# Patient Record
Sex: Female | Born: 1935 | Race: White | Hispanic: No | Smoking: Never smoker
Health system: Southern US, Community
[De-identification: ages and names within clinical notes are randomized; demographics above are authoritative.]

## PROBLEM LIST (undated history)

## (undated) DIAGNOSIS — I509 Heart failure, unspecified: Secondary | ICD-10-CM

## (undated) DIAGNOSIS — I10 Essential (primary) hypertension: Secondary | ICD-10-CM

## (undated) DIAGNOSIS — I441 Atrioventricular block, second degree: Secondary | ICD-10-CM

## (undated) DIAGNOSIS — D649 Anemia, unspecified: Secondary | ICD-10-CM

## (undated) DIAGNOSIS — R011 Cardiac murmur, unspecified: Secondary | ICD-10-CM

## (undated) DIAGNOSIS — I5032 Chronic diastolic (congestive) heart failure: Secondary | ICD-10-CM

## (undated) DIAGNOSIS — Z961 Presence of intraocular lens: Secondary | ICD-10-CM

## (undated) DIAGNOSIS — I4891 Unspecified atrial fibrillation: Secondary | ICD-10-CM

## (undated) DIAGNOSIS — M1990 Unspecified osteoarthritis, unspecified site: Secondary | ICD-10-CM

## (undated) DIAGNOSIS — IMO0002 Reserved for concepts with insufficient information to code with codable children: Secondary | ICD-10-CM

## (undated) DIAGNOSIS — F0390 Unspecified dementia without behavioral disturbance: Secondary | ICD-10-CM

## (undated) DIAGNOSIS — Z7901 Long term (current) use of anticoagulants: Secondary | ICD-10-CM

## (undated) DIAGNOSIS — J189 Pneumonia, unspecified organism: Secondary | ICD-10-CM

## (undated) DIAGNOSIS — I340 Nonrheumatic mitral (valve) insufficiency: Secondary | ICD-10-CM

## (undated) DIAGNOSIS — Z950 Presence of cardiac pacemaker: Secondary | ICD-10-CM

## (undated) DIAGNOSIS — R001 Bradycardia, unspecified: Secondary | ICD-10-CM

## (undated) DIAGNOSIS — H26491 Other secondary cataract, right eye: Secondary | ICD-10-CM

## (undated) DIAGNOSIS — I119 Hypertensive heart disease without heart failure: Secondary | ICD-10-CM

## (undated) HISTORY — DX: Reserved for concepts with insufficient information to code with codable children: IMO0002

## (undated) HISTORY — PX: TOTAL HIP ARTHROPLASTY: SHX124

## (undated) HISTORY — DX: Presence of intraocular lens: Z96.1

## (undated) HISTORY — DX: Bradycardia, unspecified: R00.1

## (undated) HISTORY — DX: Unspecified atrial fibrillation: I48.91

## (undated) HISTORY — DX: Hypertensive heart disease without heart failure: I11.9

## (undated) HISTORY — DX: Atrioventricular block, second degree: I44.1

## (undated) HISTORY — DX: Chronic diastolic (congestive) heart failure: I50.32

## (undated) HISTORY — DX: Nonrheumatic mitral (valve) insufficiency: I34.0

## (undated) HISTORY — DX: Other secondary cataract, right eye: H26.491

---

## 1998-08-03 ENCOUNTER — Other Ambulatory Visit: Admission: RE | Admit: 1998-08-03 | Discharge: 1998-08-03 | Payer: Self-pay | Admitting: Obstetrics & Gynecology

## 1999-08-13 ENCOUNTER — Other Ambulatory Visit: Admission: RE | Admit: 1999-08-13 | Discharge: 1999-08-13 | Payer: Self-pay | Admitting: Obstetrics & Gynecology

## 1999-11-27 ENCOUNTER — Encounter: Payer: Self-pay | Admitting: Specialist

## 1999-11-30 ENCOUNTER — Encounter (INDEPENDENT_AMBULATORY_CARE_PROVIDER_SITE_OTHER): Payer: Self-pay

## 1999-11-30 ENCOUNTER — Encounter: Payer: Self-pay | Admitting: Specialist

## 1999-11-30 ENCOUNTER — Inpatient Hospital Stay (HOSPITAL_COMMUNITY): Admission: RE | Admit: 1999-11-30 | Discharge: 1999-12-03 | Payer: Self-pay | Admitting: Specialist

## 1999-12-03 ENCOUNTER — Inpatient Hospital Stay (HOSPITAL_COMMUNITY)
Admission: RE | Admit: 1999-12-03 | Discharge: 1999-12-07 | Payer: Self-pay | Admitting: Physical Medicine & Rehabilitation

## 2000-07-08 HISTORY — PX: COLONOSCOPY: SHX174

## 2000-08-20 ENCOUNTER — Other Ambulatory Visit: Admission: RE | Admit: 2000-08-20 | Discharge: 2000-08-20 | Payer: Self-pay | Admitting: Obstetrics & Gynecology

## 2001-02-11 ENCOUNTER — Ambulatory Visit (HOSPITAL_COMMUNITY): Admission: RE | Admit: 2001-02-11 | Discharge: 2001-02-11 | Payer: Self-pay | Admitting: Gastroenterology

## 2002-10-14 ENCOUNTER — Other Ambulatory Visit: Admission: RE | Admit: 2002-10-14 | Discharge: 2002-10-14 | Payer: Self-pay | Admitting: Obstetrics & Gynecology

## 2004-11-08 ENCOUNTER — Other Ambulatory Visit: Admission: RE | Admit: 2004-11-08 | Discharge: 2004-11-08 | Payer: Self-pay | Admitting: Obstetrics & Gynecology

## 2005-08-02 ENCOUNTER — Encounter: Admission: RE | Admit: 2005-08-02 | Discharge: 2005-08-02 | Payer: Self-pay | Admitting: Orthopedic Surgery

## 2005-09-05 ENCOUNTER — Encounter: Admission: RE | Admit: 2005-09-05 | Discharge: 2005-09-05 | Payer: Self-pay | Admitting: Cardiology

## 2005-09-12 ENCOUNTER — Encounter: Payer: Self-pay | Admitting: Cardiology

## 2005-09-12 ENCOUNTER — Ambulatory Visit (HOSPITAL_COMMUNITY): Admission: RE | Admit: 2005-09-12 | Discharge: 2005-09-12 | Payer: Self-pay | Admitting: Cardiology

## 2005-10-15 ENCOUNTER — Inpatient Hospital Stay (HOSPITAL_COMMUNITY): Admission: RE | Admit: 2005-10-15 | Discharge: 2005-10-18 | Payer: Self-pay | Admitting: Orthopedic Surgery

## 2005-10-15 ENCOUNTER — Ambulatory Visit: Payer: Self-pay | Admitting: Physical Medicine & Rehabilitation

## 2006-01-27 ENCOUNTER — Inpatient Hospital Stay (HOSPITAL_COMMUNITY): Admission: AD | Admit: 2006-01-27 | Discharge: 2006-01-30 | Payer: Self-pay | Admitting: Cardiology

## 2009-10-18 ENCOUNTER — Emergency Department (HOSPITAL_COMMUNITY): Admission: EM | Admit: 2009-10-18 | Discharge: 2009-10-18 | Payer: Self-pay | Admitting: Emergency Medicine

## 2009-10-22 ENCOUNTER — Emergency Department (HOSPITAL_COMMUNITY): Admission: EM | Admit: 2009-10-22 | Discharge: 2009-10-22 | Payer: Self-pay | Admitting: Emergency Medicine

## 2010-03-18 ENCOUNTER — Emergency Department (HOSPITAL_COMMUNITY): Admission: EM | Admit: 2010-03-18 | Discharge: 2010-03-18 | Payer: Self-pay | Admitting: Emergency Medicine

## 2010-03-20 ENCOUNTER — Emergency Department (HOSPITAL_COMMUNITY): Admission: EM | Admit: 2010-03-20 | Discharge: 2010-03-20 | Payer: Self-pay | Admitting: Family Medicine

## 2010-09-20 LAB — PROTIME-INR
INR: 1.75 — ABNORMAL HIGH (ref 0.00–1.49)
Prothrombin Time: 20.6 seconds — ABNORMAL HIGH (ref 11.6–15.2)

## 2010-09-25 LAB — CBC
MCHC: 36 g/dL (ref 30.0–36.0)
RDW: 12.8 % (ref 11.5–15.5)

## 2010-09-25 LAB — DIFFERENTIAL
Basophils Relative: 0 % (ref 0–1)
Eosinophils Absolute: 0.1 10*3/uL (ref 0.0–0.7)
Eosinophils Relative: 1 % (ref 0–5)
Lymphocytes Relative: 18 % (ref 12–46)
Monocytes Absolute: 1.1 10*3/uL — ABNORMAL HIGH (ref 0.1–1.0)
Neutro Abs: 7.4 10*3/uL (ref 1.7–7.7)

## 2010-09-25 LAB — URINALYSIS, ROUTINE W REFLEX MICROSCOPIC
Bilirubin Urine: NEGATIVE
Glucose, UA: NEGATIVE mg/dL
Leukocytes, UA: NEGATIVE
Nitrite: NEGATIVE
Protein, ur: NEGATIVE mg/dL
Urobilinogen, UA: 0.2 mg/dL (ref 0.0–1.0)
pH: 7 (ref 5.0–8.0)

## 2010-09-25 LAB — PROTIME-INR: INR: 2.31 — ABNORMAL HIGH (ref 0.00–1.49)

## 2010-09-26 LAB — POCT I-STAT, CHEM 8
Calcium, Ion: 1.24 mmol/L (ref 1.12–1.32)
Chloride: 103 mEq/L (ref 96–112)
Creatinine, Ser: 1 mg/dL (ref 0.4–1.2)
Glucose, Bld: 111 mg/dL — ABNORMAL HIGH (ref 70–99)
HCT: 42 % (ref 36.0–46.0)
Potassium: 4 mEq/L (ref 3.5–5.1)
Sodium: 138 mEq/L (ref 135–145)

## 2010-09-26 LAB — CBC
HCT: 40.5 % (ref 36.0–46.0)
Hemoglobin: 13.4 g/dL (ref 12.0–15.0)
MCHC: 33 g/dL (ref 30.0–36.0)
MCV: 89.7 fL (ref 78.0–100.0)
Platelets: 262 10*3/uL (ref 150–400)
RBC: 4.51 MIL/uL (ref 3.87–5.11)
RDW: 13.4 % (ref 11.5–15.5)
WBC: 10.6 10*3/uL — ABNORMAL HIGH (ref 4.0–10.5)

## 2010-09-26 LAB — PROTIME-INR: INR: 2.12 — ABNORMAL HIGH (ref 0.00–1.49)

## 2010-09-26 LAB — DIFFERENTIAL
Eosinophils Absolute: 0.2 10*3/uL (ref 0.0–0.7)
Monocytes Absolute: 0.8 10*3/uL (ref 0.1–1.0)
Monocytes Relative: 8 % (ref 3–12)

## 2010-11-23 NOTE — Discharge Summary (Signed)
[place]  Patient:    [name], [name]                   MRN: [id number] Adm. Date:  [id number] Disc. Date: [id number] Attending:  Herold Harms Dictator:   Mcarthur Rossetti. Angiulli, P.A. CC:         Daniel L. Thomasena Edis, M.D.             Philips J. Montez Morita, M.D.             Georg Ruddle. Viviann Spare, M.D.                           Discharge Summary  DISCHARGE DIAGNOSES: 1. Right total hip replacement Nov 30, 1999. 2. Postoperative anemia. 3. Hypokalemia, resolved. 4. Hypertension. 5. Anxiety.  HISTORY OF PRESENT ILLNESS:  [age]-year-old white female admitted Nov 30, 1999, with progressive right hip pain.  No relief with conservative care. X-rays with advanced bone-on-bone changes.  Underwent a right total hip replacement Nov 30, 1999, per Dr. Ronnell Guadalajara.  Remained on aspirin as prior to admission.  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  Postoperative anemia of 8, transfused on Dec 01, 1999. She was partial weightbearing with a walker.  A follow-up CT scan of the chest was completed after a nodule was viewed on chest x-ray that showed no mass on Nov 30, 1999.  She was ambulating with minimal assistance.  Hypokalemia of 2.9 and supplemented with potassium.  She is admitted for comprehensive rehabilitation program.  PAST MEDICAL HISTORY: 1. Hypotension. 2. 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 3. Anxiety.  PAST SURGICAL HISTORY:  D&C.  ALLERGIES:  None.  TOBACCO/ALCOHOL:  None.  PRIMARY M.D.:  Dr. Viviann Spare.  MEDICATIONS PRIOR TO ADMISSION: 1. Caltrate 600 mg twice daily. 2. Aspirin. 3. Celebrex. 4. Ziac. 5. Prempro. 6. Fosamax. 7. Xanax as needed.  SOCIAL HISTORY:  Lives alone in [place].  Independent prior to admission. One-level home with three steps to entry.  Daughter in [place], to stay a short time after discharge.  HOSPITAL COURSE:  The patient did well while on rehabilitation services  with therapies initiated on a b.i.d. basis.  The following issues were followed during the patients rehabilitation course.  Pertaining to Ms. Morrisons right total hip replacement, remained stable.  Surgical site healing nicely. No signs of infection.  She was partial weightbearing with a walker.  She would follow up with orthopedic services in two weeks for removal of staples. She had a venous Doppler of the lower extremities that showed no signs of deep vein thrombosis.  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  She remained on her aspirin as prior to hospital admission.  Her hypokalemia resolved, with latest potassium level within normal limits at 4.6 and discontinued.  Her blood pressures remained controlled.  She would resume her home medications.  Follow up with Dr. Shonna Chock as advised.  He had a history of anxiety.  She has Xanax as needed. She had no bowel or bladder disturbances.  Noted postoperative transfusion with latest hemoglobin 8.2, hematocrit 23.8.  She continued on her iron supplement.  There were no bleeding episodes.  Overall, for her functional mobility she was ambulating extended distances with a walker, essentially independent to standby assist in all areas of activities of daily living, in dressing, grooming, and homemaking.  Overall, her strength and endurance greatly improved as she was  encouraged with her overall progress and discharged to home.  DISCHARGE MEDICATIONS: 1. Trinsicon twice daily. 2. Os-Cal twice daily. 3. OxyContin CR 20 mg every 12 hours x 1 week. 4. Vitamin E. 5. Ziac 5/6.25 daily. 6. Prempro as directed. 7. Aspirin 325 mg daily. 8. Darvocet as needed for pain.  ACTIVITY:  Partial weightbearing with walker.  DIET:  Regular.  WOUND CARE:  Follow up with orthopedic services, Dr. Ronnell Guadalajara, in two weeks for removal of staples.  Patient was allowed to shower per orthopedic services.  SPECIAL  INSTRUCTIONS:  No driving was advised.  The plan was for home health occupational and physical therapy. DD:  12/06/99 TD:  12/10/99 Job: [id number]

## 2010-11-23 NOTE — Discharge Summary (Signed)
NAMEGLORI, MACHNIK            ACCOUNT NO.:  [id number]   MEDICAL RECORD NO.:  [id number]          PATIENT TYPE:  INP   LOCATION:  3742                         FACILITY:  [facility]   PHYSICIAN:  Georga Hacking, M.D.DATE OF BIRTH:  1932/07/10   DATE OF ADMISSION:  01/27/2006  DATE OF DISCHARGE:  01/30/2006                                 DISCHARGE SUMMARY   FINAL DIAGNOSIS:  1.  Atrial fibrillation.      1.  Tikosyn  administered successfully with reversion to sinus rhythm.  2.  Hypertensive heart disease.  3.  Mitral regurgitation.  4.  Long-term therapy with Coumadin.  5.  History of PVCs.  6.  Hyperlipidemia.  7.  Anxiety.   HISTORY:  The patient is a 77 year old female who has a history of atrial  fibrillation and mitral regurgitation.  She is symptomatic from the atrial  fibrillation and is admitted to the hospital to initiate Tikosyn therapy and  consider cardioversion.  She has had a previous hip replacement and has been  therapeutic on her Coumadin.  She has a history moderate mitral  regurgitation on echocardiogram.  Please see history and  physical for many  the details.   HOSPITAL COURSE:  CBC was normal.  Protime INR was 3.2 on admission.  Chemistry panel showed isolated glucose of 147.  Potassium and magnesium  were normal.  The patient underwent initiation with Tikosyn therapy and  reverted to sinus rhythm on the third day.  QTC was 0.48 after loading with  Tikosyn.  Education was done regarding Tikosyn and extensive questions were  answered.  She was specifically told to avoid Verapamil, HCTZ,  and  cimetidine or Tagamet.  She will be continued on Coumadin and she was  changed from Ziac to [place].  She is discharged at this time in improved  condition.  She is to walk daily and may resume normal activities.   DISCHARGE MEDICATIONS:  Include Tikosyn  250 mcg twice a day, Diovan 320 mg  daily, Norvasc 5 mg daily 4-KAYO  injections daily, Coumadin  5 mg daily  except 7.5 mg twice a week, Vytorin 10/20 mg daily,  Zebeta 10 mg daily,  Caltrate 600 mg twice daily.   FOLLOW UP:  She is to be seen in follow-up  in 2 weeks, and seen in the  office for protime in 1 week.      Georga Hacking, M.D.  Electronically Signed     WST/MEDQ  D:  01/30/2006  T:  01/30/2006  Job:  [id number]   cc:   Wilson Singer, M.D.  Fax: [phone]

## 2010-11-23 NOTE — H&P (Signed)
NAMEMARIALIZ, Franklin            ACCOUNT NO.:  [id number]   MEDICAL RECORD NO.:  [id number]          PATIENT TYPE:  INP   LOCATION:  NA                           FACILITY:  [facility]   PHYSICIAN:  Crystal Frankel. Charlann Franklin, M.D.  DATE OF BIRTH:  1933/05/08   DATE OF ADMISSION:  10/15/2005  DATE OF DISCHARGE:                                HISTORY & PHYSICAL   PRINCIPAL DIAGNOSIS:  Left hip osteoarthritis.   HISTORY OF PRESENT ILLNESS:  [name] is a very pleasant 77 year old  female who presented for further evaluation of her left hip.  She was had  previously been evaluated for right total hip replacement which was  performed by Dr. Ronnell Guadalajara in May 2001.  She has done very well with  this.  She has had increasing discomfort in the left proximal thigh.  She  uses a cane to get around.  Despite an injection which helped a little bit,  she continues to have to use a cane for functioning.  She, at this point, is  not pleased with the non-operative measures and wishes to proceed with left  hip replacement after she had had very good results with the right hip thus  far.   PAST MEDICAL HISTORY:  1.  Osteoporosis.  2.  Osteoarthritis.  3.  History of atrial fibrillation which is rate controlled.  4.  Valve disease.  5.  History of questionable concern about being a carrier of hemophilia.   PAST SURGICAL HISTORY:  Right total hip replacement in May 2001.   ALLERGIES:  No known drug allergies.   CURRENT MEDICATIONS:  Coumadin, calcium, fish oil, Ziac, vitamins, Celebrex,  Forteo, Vytorin, Micardis.   REVIEW OF SYSTEMS:  Otherwise negative.   PRIMARY CARE PHYSICIAN:  [name], M.D.   CARDIOLOGIST:  [name], M.D.  Dr. Donnie Franklin wishes to be consulted  when she is in the hospital.  We do have a plan for her Coumadin stoppage  five days prior to surgery with plans to resume it postoperatively.   PHYSICAL EXAMINATION:  GENERAL:  [name] is a pleasant  77 year old  female in no acute distress.  She is awake, alert, oriented, and  cooperative.  No focal deficits noted.  VITAL SIGNS:  Examination reveals that she has a temperature of 98.6, pulse  64, blood pressure 182/86.  NECK:  Clear to auscultation with no bruits.  CHEST:  Clear bilaterally.  No rales or rhonchi.  HEART:  Irregular rate and irregular rhythm.  She has history of a murmur.  ABDOMEN:  Soft, nontender with positive bowel sounds.  EXTREMITIES:  She is limited and painful range of motion of the left hip.  The right hip range of motion is well tolerated.  She has previous well-  healed incision of the left hip.  Reflex otherwise normal.   RADIOGRAPHS:  Left hip end-stage osteoarthritis, status post right total hip  replacement.   IMPRESSION:  Left hip osteoarthritis.   PLAN:  [name] will be admitted for same-day surgery on October 15, 2005  for left total hip replacement. We discussed extensively risks  and benefits  of the hip replacement surgery including but not limited to infection, DVT,  dislocation, component failure, need for revision, and persistent discomfort  despite response to the right hip.  She has added risk related to her atrial  fibrillation and use of Coumadin and this will be arranged and followed by  Dr. Donnie Franklin and [name] as needed for support.  Questions were  encouraged, answered, reviewed, and discussed at length.      [name], M.D.  Electronically Signed     MDO/MEDQ  D:  10/02/2005  T:  10/04/2005  Job:  [id number]

## 2010-11-23 NOTE — Op Note (Signed)
[place]  Patient:    [name], [name]                   MRN: [id number] Proc. Date: 11/30/99 Adm. Date:  [id number] Attending:  Herold Harms                           Operative Report  PREOPERATIVE DIAGNOSIS:  Degenerative arthritis right hip.  POSTOPERATIVE DIAGNOSIS:  Degenerative arthritis right hip.  OPERATION:  Right total hip replacement arthroplasty.  SURGEON:  Philips J. Montez Morita, M.D.  ASSISTANT:  Illene Labrador. [place], M.D.  DESCRIPTION OF PROCEDURE:  After suitable general anesthesia, she was positioned in the left lateral decubitus position and the right hip prepped and draped routinely.  A modified ostium approach was made to the hip resecting the femoral attachment of the gluteus maximus for better retraction of the maximus.  The external rotators were removed and tagged, the capsule was opened, and a severe angry synovitis within the hip joint was noted.  Posterior capsulectomy was carried out.  The hip was dislocated and the head amputated.  The femur was then reamed to accept a size 9 [employer] primary secure fit plus hip which gave [place] a 127 degree neck angle and a clothes pin-type stem.  Following that, the acetabulum was debrided anteriorly and cleaned out within and some of the angry synovium was sent for histopathology.  Following that, it was reamed to a 48 PSL microstructure acetabular shell and trial reduction carried out revealing the need to change a little bit of the anteversion and this was adjusted and gave excellent stability in the flexion and extension parameters.  The real cup was then inserted.  Second trial reduction carried out which was excellent.  Two screws were then used to affix the cup, a 40 and a short one, and then the real cup was inserted; after which, the femur was inserted.  A trial reduction then with a +5 neck seemed to be just perfect for his leg length and tension.  Final test on  rotation for stability, flexion, and extension with internal and external rotation was fine.  Routine wound closure then accomplished over Hemovac.  She lost about 400 cc of blood.  She received none.  She goes to recovery in good condition. DD:  11/30/99 TD:  12/04/99 Job: [id number]

## 2010-11-23 NOTE — Procedures (Signed)
[facility]  Patient:    [name], [name]                   MRN: [id number] Proc. Date: 02/11/01 Adm. Date:  [id number] Attending:  Louie Bun CC:         Freddy Finner, M.D.   Procedure Report  PROCEDURE:  Colonoscopy.  INDICATION FOR PROCEDURE:  History of adenomatous colon polyps with last colonoscopy four years ago.  DESCRIPTION OF PROCEDURE:  The patient was placed in the left lateral decubitus position then placed on the pulse monitor with continuous low flow oxygen delivered by nasal cannula. She was sedated with 50 mg IV Demerol and 6 mg IV Versed. The Olympus video colonoscope was inserted into the rectum and advanced to the cecum, confirmed by transillumination at McBurneys point and visualization of the ileocecal valve and appendiceal orifice. The prep was excellent.  The cecum, ascending, transverse, descending colon appeared normal with no masses, polyps, diverticula or other mucosal abnormalities. Within the sigmoid colon was seen several diverticula and no other abnormalities. The rectum appeared normal and retroflexed view of the anus revealed no obvious internal hemorrhoids. The colonoscope was then withdrawn and the patient returned to the recovery room in stable condition. The patient tolerated the procedure well and there were no immediate complications.  IMPRESSION:  Left sided diverticulosis otherwise normal colonoscopy.  PLAN:  Repeat colonoscopy in five years. DD:  02/11/01 TD:  02/12/01 Job: [id number]

## 2010-11-23 NOTE — Discharge Summary (Signed)
[place]  Patient:    [name], [name]                   MRN: [id number] Adm. Date:  [id number] Disc. Date: [id number] Attending:  Herold Harms Dictator:   Della Goo, P.A.-C.                           Discharge Summary  ADMITTING DIAGNOSES: 1. Osteoarthritis, right hip. 2. Hypertension. 3. History of premature ventricular contractions. 4. History of claustrophobia. 5. History of hemorrhoids. 6. 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  DISCHARGE DIAGNOSES: 1. Osteoarthritis, right hip. 2. Hypertension. 3. History of premature ventricular contractions. 4. History of claustrophobia. 5. History of hemorrhoids. 6. 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 7. Post-hemorrhagic anemia. 8. Hypokalemia.  PROCEDURES:  On Nov 30, 1999, patient underwent right total hip arthroplasty performed by Dr. Montez Morita, assisted by Dr. Simonne Come, under general anesthesia.  CONSULTATIONS:  Physical medicine and rehabilitation.  HISTORY OF PRESENT ILLNESS:  Patient is a 77 year old white female with a long history of right hip pain and dysfunction.  X-rays show marked bone-on-bone contact with cystic formation.  Due to her significant findings on x-rays as well as her continuing symptoms and lack of response to conservative treatment, it was felt that she would require surgical intervention and was admitted for the procedure as stated above.  HOSPITAL COURSE:  Patient tolerated the procedure without difficulty.  On the first postoperative day, neurovascular motor function of the lower extremities was intact.  Her Hemovac was removed without difficulty.  She was treated with p.o. analgesics postoperatively and tolerated these well.  Patient underwent a follow-up lung CT scan because of recommendation noted on preoperative chest x-ray and this was found to be within normal limits.  It was felt patient would require rehab consult as she was required to be independent at discharge.  A  consult was obtained.  Patient was found to be a suitable candidate and was placed on a rehab bed waiting list.  While awaiting a bed to become available, she received physical therapy for ambulation and gait training and was allowed partial weightbearing on the operative extremity. She was placed with strict total hip replacement precautions.  Patients Foley catheter was discontinued on the second postoperative day and she was voiding without difficulty.  Hemoglobin dropped to 8.0 and patient received two units of autologous blood.  Patient was noted to be hypokalemic and potassium was added to her IV fluids.  She was also placed on iron for her posthemorrhagic anemia.  Dressing change was done on the first postoperative day and wound was found to be healing well.  On the third postoperative day, patient continued to have a low hemoglobin at 8.0 following her transfusion.  However, she was noted to have some anemia in the past and had used oral iron in the past. Trinsicon was started and patient tolerated this well.  Patient was noted on the third postoperative day to have a scant amount of serous drainage; however, she was afebrile and vital signs were stable.  A bed was made available at the rehab center and, even though she had not experienced a great deal of physical therapy on the floor, it was felt that she was stable for transfer.  PERTINENT LABORATORY VALUES:  Preoperative chest x-ray showed chronic obstructive pulmonary disease with probable scarring in the right apex. Previous x-ray comparison was suggested ______ limited CT was suggested.  A limited CT was  performed and showed right apical pleural and parenchymal scarring.  No evidence of pulmonary nodule or active lung disease in the apices.  CBC on admission with hemoglobin 11.4, hematocrit 34.2, RBC 3.82. Postoperatively, hemoglobin dropped to 8.0 and, as stated above, she did require transfusion.  Hemoglobin returned to a  value of 8.8.  Urinalysis on admission was negative for urinary tract infection.  Chemistry studies on admission showed sodium 133 with remaining values normal.  Postoperative chemistry studies have continued to show hyponatremia with patient being asymptomatic.  She was also hypokalemic and both of these were treated with fluid adjustments.  Coagulation studies on admission were within normal limits.  Patient was not placed on Coumadin but treated with bilateral TED stockings and SCD hose.  She was also placed on enteric-coated aspirin 325 mg daily for DVT prophylaxis.  CONDITION ON DISCHARGE:  Stable.  PLAN:  Patient was transferred to [facility] inpatient rehabilitation unit for continuation of her physical therapy.  She will have the usual protocol for total hip replacement and is allowed partial weightbearing on the operative extremity.  She should adhere to straight total hip replacement precautions. Patient will receive occupational therapy for activities of daily living. Dressing changes will be done daily and staples removed from the wound two weeks from the date of surgery if it continues to heal well.  She will continue on enteric-coated aspirin and bilateral TED hose for deep venous thrombosis prophylaxis.  Her anemia will be monitored and she should continue on supplementation.  Medications otherwise will be prescribed by the physicians at the rehabilitation center.  She will follow up with Dr. Montez Morita following her stay at the rehabilitation center.  She will resume her regular diet.  Any questions regarding her orthopedic care should be addressed by Dr. Montez Morita. DD:  01/02/00 TD:  01/02/00 Job: [id number]

## 2010-11-23 NOTE — Discharge Summary (Signed)
[name], [name]            ACCOUNT NO.:  [id number]   MEDICAL RECORD NO.:  [id number]          PATIENT TYPE:  INP   LOCATION:  1506                         FACILITY:  [facility]   PHYSICIAN:  Madlyn Frankel. Charlann Boxer, M.D.  DATE OF BIRTH:  1933/04/04   DATE OF ADMISSION:  10/15/2005  DATE OF DISCHARGE:  10/18/2005                                 DISCHARGE SUMMARY   ADMISSION DIAGNOSES:  1.  Left hip osteoarthritis.  2.  Osteoporosis.  3.  Osteoarthritis.  4.  History of atrial fibrillation with rate controlled.  5.  Cardiac valve disease.  6.  History of questionable concern about the care of hemophilia.   DISCHARGE DIAGNOSES:  1.  Left hip osteoarthritis.  2.  Osteoporosis.  3.  Osteoarthritis.  4.  History of atrial fibrillation with rate controlled.  5.  Cardiac valve disease.  6.  History of questionable concern about the care of hemophilia.  7.  Mild postoperative anemia.   OPERATION:  On October 15, 2005, the patient underwent a left total hip  replacement arthroplasty utilizing the DePuy hip system.   HISTORY:  This 77 year old lady underwent a right total hip replacement  arthroplasty and has done very well with that hip.  Unfortunately, her left  hip has markedly increased her pain and discomfort and inability to get  about.  Failure of conservative care including anti-inflammatories and  walking with an assistive device really has not helped her.  After reviewing  the risks and benefits of surgery and the fact that she  desires to  normalize her life, we decided to go ahead, along with the patient, for a  total hip replacement arthroplasty on the left.   HOSPITAL COURSE:  The patient tolerated the surgical procedure quite well.  Dr. Georga Hacking saw the patient, as he was her regular cardiologist.  He continued to follow her throughout the hospitalization.  The patient will  be placed on Coumadin protocol for the prevention of a deep venous  thrombosis as well as  for her brief run of atrial fibrillation.  The cardiac  enzymes were done.  All the markers were negative.  This relieved somewhat  her anxiety.   Dr. Donnie Aho approved the discharge, as far as her cardiac status is  concerned.  He felt that he could follow this on an outpatient basis.  On  the day of discharge her wound was clean and dry, without evidence of  infection.  Neurovascular was intact to the upper extremity.  She had done  very well in physical therapy.  It was felt that she could be maintained in  the home environment and arrangements were made for discharge.   LABORATORY DATA:  Hematologically showed a CBC with differential completely  within normal limits preoperatively. The hemoglobin was 12.8, hematocrit  38.1.  The final hemoglobin was 9.1 with hematocrit of 27.8.  This came up  from 8, with the administration of p.o. iron.  Blood chemistries were  essentially negative other than a slight depression in her sodium at 131.  A  urinalysis was negative for a urinary tract infection.  Electrocardiograms showed atrial fibrillation with a rate of 85.   No chest x-ray is seen on this chart.   CONDITION ON DISCHARGE:  Improved, stable.   PLAN:  The patient is discharged to her home, in the care of her family.  She is to follow up with Dr. Donnie Aho per his instructions.   DISCHARGE MEDICATIONS:  1.  Vicodin for discomfort.  2.  Robaxin for a muscle relaxant.  3.  Coumadin for four weeks after the date of surgery, to be administered by      the pharmacy.  4.  __________ one tab b.i.d. for the next 30 days.  5.  Use over-the-counter laxative and Senokot as needed.  6.  Xanax 0.5 mg p.r.n. anxiety.      Dooley L. Cherlynn June.      Madlyn Frankel Charlann Boxer, M.D.  Electronically Signed    DLU/MEDQ  D:  10/24/2005  T:  10/25/2005  Job:  [id number]   cc:   Georga Hacking, M.D.  Fax: [phone]  Email: [email or web address]

## 2010-11-23 NOTE — Op Note (Signed)
NAMEVIVIANNA, [name]            ACCOUNT NO.:  [id number]   MEDICAL RECORD NO.:  [id number]          PATIENT TYPE:  INP   LOCATION:  0002                         FACILITY:  [facility]   PHYSICIAN:  Madlyn Frankel. Charlann Boxer, M.D.  DATE OF BIRTH:  1933-06-05   DATE OF PROCEDURE:  10/15/2005  DATE OF DISCHARGE:                                 OPERATIVE REPORT   PREOPERATIVE DIAGNOSIS:  Left hip osteoarthritis.   POSTOPERATIVE DIAGNOSIS:  Left hip osteoarthritis.   OPERATION PERFORMED:  Left total hip replacement.   COMPONENTS USED:  DePuy hip system with a size 52 Pinnacle cup, two  cancellous bone screws, 36 metal neutral liner with a tri-lock high offset  stem size 13.8 with a 36 + 1.5 ball.   SURGEON:  Madlyn Frankel. Charlann Boxer, M.D.   ASSISTANTDruscilla Brownie. Cherlynn June.   ANESTHESIA:  General.   ESTIMATED BLOOD LOSS:  200 mL.   DRAINS:  One.   COMPLICATIONS:  None.   INDICATIONS FOR PROCEDURE:  [name] is a very pleasant 77 year old  female who is status post a right total hip replacement.  She has done quite  well with this, right hip.  She has had progressive left hip pain.  Note  that the right total hip placed in May of 2001.  She had had progressive  pain in this left hip with failure of conservative measures including anti-  inflammatories, attempts at conservative measures with assistive device.  After reviewing with her risks and benefits as well as reviewing with her  the benefits she received on the right hip, she wished to proceed.   We discussed and reviewed risks and benefits including risks of DVT,  infection, component failure, dislocation, need for further surgery,  persistent pain as well as leg lengths.  Consent was obtained.   DESCRIPTION OF PROCEDURE:  The patient was brought to the operating theater.  Once adequate anesthesia and preoperative antibiotics, 1 g of Ancef, was  administered, the patient was positioned in the right lateral decubitus  position with  the left side up.  The left lower extremity was then prepped  and draped in sterile fashion with a sterile pre-prep.  Lateral based  incision was made for posterior approach to the hip. The iliotibial band and  gluteus fascia was incised in line with the incision for posterior approach.  The short external rotators were taken down separate from the posterior  capsule which was saved for protection against the sciatic nerve as well as  protection as well as repair postoperatively.  The hip was dislocated.  The  neck osteotomy was made based off of the anatomic landmarks based on the tip  of the trochanter compared with the contralateral hip and preoperative  templating.   At this point attention was directed to the femur where femoral preparation  was carried out protocol for a tri-lock stem with initial canal finding  followed by broaching all the way up initially a 12.5 broach.  This broach  appeared to have some good stability.  I stopped at this point from a leg  length standpoint to allow  for moving up or down within the femoral neck.  I  was able to obtain approximately 20 degrees of anteversion in the femoral  neck which was about 10 degrees more than her native.   At this point attention was directed to the acetabulum.  Acetabular exposure  was obtained routinely with labrectomy, acetabular retractor placement.  Reaming commenced with a 43 reamer to get down tothewalls to provide  coverage.  Reaming then was carried up sequentially.  I decided once I had  reached the 49 level that there was enough bone posteriorly to proceed with  further reaming to get to 51 cup, reaming to allow for 52 cup placement and  a 36 metal on metal ball in a 77 year old female with relatively good  health.   This was carried out without complication.  There was excellent bony bed  preparation.  I did pack some bone graft in medial aspect of the wound from  the reamers to allow for some bony ingrowth  behind the cup.  Two cancellous  bone screws were placed for initial stability and rotatory torsional  stability.  Trial liner was placed.  Acetabular cup position was in 35  degrees of abduction and 20 degrees of forward flexion beneath the anterior  wall anteriorly.   At this point a trial reduction was carried out with a 12.5 broach, high  offset neck with a 36 + 1.5 ball.  The hip reduced very easily.  There was  at least 3 to 4 mm shuck at this point with the hip in extension. The hip  was very stable, however, and had no evidence of subluxation or impingement.  Based on the level of shuck and soft tissue tension, I went ahead and took  this trial broach out and went ahead and made room for 13.8 broach.  After  some time and cautious effort to broach impaction, I was able to get a 13.8  broach down to the neck cut which was a little more proud than what the 12.5  was initially.  I had excellent fit with no evidence of any torsional  movement.  A trial reduction was again carried out with a 1.5 ball as well  as a -2 ball.  The -2 ball felt just like the previous reduction.  For that  reason I went with a 1.5. The soft tension appeared to be very good, it did  not appear based on the comparison to the down leg preoperatively prior to  draping the patient that the left lower extremity was any longer than the  right at this point.  Given the stability and the soft tissue tension, I  chose this option.   At this point a central man hole cover was placed in the apex of the  acetabular cup.  The cup was irrigated, dried and a 36 neutral metal liner  placed.  The final 13.8 high offset tri-lock stem was then impacted to a  level of where the broach was without complications.  Again, the trial  reduction was carried out and again I felt that the hip stability and soft  tissue tension were much more appropriate with a 1.5 ball.  I did note that the tip of the 1.5 ball was slightly above the  tip of the trochanter  compared to the preoperative template, but the acetabular cup appeared to be  perhaps a little more seated into the pelvis than down at the lower levels  of the pelvis indicating  a stable ligaments.  Following all these  determinations, the final 36 +1.5 ball was impacted onto a clean and dry  trunnion and the hip reduced, and amply irrigated throughout the case and  again at this point.  The posterior capsule was reapproximated to the  superior and anterior leaflet with a #1 Ethibond.  A medium Hemovac drain  was then placed.  The remainder of the wound was closed in layers with #1  Ethibond on the iliotibial band, #1 Vicryl in the gluteus fascia running.  The subcu fat was closed both in two layers with a running 4-0 Monocryl in  the skin.  The hip was cleaned, dried and dressed sterilely with Steri-  Strips, dressing sponges and tape.  Patient then extubated and brought to  the recovery room in table condition.     Madlyn Frankel Charlann Boxer, M.D.  Electronically Signed    MDO/MEDQ  D:  10/15/2005  T:  10/15/2005  Job:  [id number]

## 2010-11-23 NOTE — H&P (Signed)
[place]  Patient:    [name], [name]                  MRN: [id number] Adm. Date:  11/30/99 Attending:  Philips J. Montez Morita, M.D. Dictator:   Alexzandrew L. Perkins, P.A.-C. CC:         Philips J. Montez Morita, M.D.             Georg Ruddle. Viviann Spare, M.D.             Darden Palmer., M.D.                         History and Physical  Office visit and History and Physical: Nov 19, 1999  CHIEF COMPLAINT: Right hip pain.  HISTORY OF PRESENT ILLNESS: The patient is a very pleasant 77 year old female who has been seen and evaluated for some time now for her right hip pain.  She is known to have osteoarthritis and has been treated conservatively for her hip pain for some time now.  She has been on Celebrex.  She is also on calcium supplements and hormone replacement therapy.  The patient states her hip pain has been progressive over the past several years.  She states she has difficulty rising from a seated position and she also has trouble with pain going up and down stairs.  More of her pain is mechanical and with weightbearing.  She comes in for follow-up evaluation.  X-rays taken back in March 2001 show marked increase change with bone-on-bone contact and cystic formation.  Due to the fact that her hip pain has been progressive in nature and the fact that she has bone-on-bone contact, with cystic changes noted on radiographs, it is felt that she would benefit from undergoing a total hip replacement arthroplasty.  The risks and benefits of this procedure have been discussed with the patient and she has elected to proceed with surgical intervention.  ALLERGIES: No known drug allergies.  CURRENT MEDICATIONS:  1. Caltrate + D p.o. b.i.d.  2. Centrum vitamins p.o. q.d.  3. PremPro 0.625/2.5 mg p.o. q.d.  4. Ziac 5/6.25 mg p.o. q.d.  5. Celebrex 200 mg p.o. b.i.d.  6. Fosamax 700 mg p.o. weekly.  7. Aspirin, stop prior to surgery.  8. Xanax  p.r.n. for claustrophobia.  9. Vitamin E 400 IU p.o. q.d.  PAST MEDICAL HISTORY:  1. 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  2. Hypertension.  3. History of PVCs.  4. History of claustrophobia.  5. History of hemorrhoids.  PAST SURGICAL HISTORY:  1. D&C.  2. Colonoscopy with polypectomy.  SOCIAL HISTORY: The patient is divorced and lives alone.  She does have two children.  Very seldom intake of alcohol and no use of tobacco.  FAMILY HISTORY: Her father is deceased at age 55 with a history of bleeding ulcers.  He was also a known hemophiliac.  Mother is deceased at age 58 secondary to pneumonia.  The patient has one son, age 1, who is also hemophiliac.  REVIEW OF SYSTEMS: GENERAL: No fever, chills, night sweats.  NEUROLOGIC: No seizures, syncope, or paralysis.  RESPIRATORY: No shortness of breath, productive cough, or hemoptysis.  CARDIOVASCULAR: No chest pain, angina, or orthopnea.  GI: No nausea, vomiting, diarrhea, or constipation.  GU: The patient does have some intermittent urgency.  No dysuria, hematuria, or discharge.  MUSCULOSKELETAL: Pertinent for right hip as on History of Present Illness.  PHYSICAL  EXAMINATION:  VITAL SIGNS: Pulse 76, respirations 15, blood pressure 165/98.  GENERAL: The patient is a 77 year old white female, thin framed, in no acute distress, only mildly anxious at the time of examination.  She is alert and oriented and cooperative.  She appears to be an average historian.  HEENT: Head normocephalic, atraumatic.  PERRL.  Oropharynx clear.  NECK: Supple.  No carotid bruits appreciated.  CHEST: Clear to auscultation and percussion.  No rales or rhonchi.  HEART: Regular rate and rhythm.  No murmurs.  ABDOMEN: Soft, flat, nontender.  No rebound or guarding.  Bowel sounds present.  RECTAL/BREAST/GU: Not done, not pertinent to present illness.  EXTREMITIES: Significant to the right lower extremity.  The patient has some pain noted on  passive range of motion with extreme flexion and internal rotation.  Motor function is intact.  The patient moves toes well on examination.  Sensation is intact throughout the right lower extremity.  LABORATORY DATA: X-rays show bone-on-bone deformity with cystic formation in the right hip.  IMPRESSION:  1. Osteoarthritis, right hip.  2. Hypertension.  3. History of premature ventricular contractions.  4. History of claustrophobia.  5. History of hemorrhoids.  6. 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  PLAN: The patient will undergo a right total hip replacement arthroplasty. The surgery will be performed by Dr. Ronnell Guadalajara.  The patient has donated two units of autologous blood in preparation for the upcoming surgery.  The patients medical doctor is Dr. Viviann Spare.  Her cardiologist is Dr. Donnie Aho. Both physicians will be notified of the room number and admission and will be consulted as needed for any medical assistance with this patient throughout the hospital course. DD:  11/21/99 TD:  11/21/99 Job: [id number]

## 2011-07-15 DIAGNOSIS — R413 Other amnesia: Secondary | ICD-10-CM | POA: Diagnosis not present

## 2011-07-15 DIAGNOSIS — R5381 Other malaise: Secondary | ICD-10-CM | POA: Diagnosis not present

## 2011-07-15 DIAGNOSIS — I10 Essential (primary) hypertension: Secondary | ICD-10-CM | POA: Diagnosis not present

## 2011-07-15 DIAGNOSIS — Z79899 Other long term (current) drug therapy: Secondary | ICD-10-CM | POA: Diagnosis not present

## 2011-07-24 DIAGNOSIS — K5730 Diverticulosis of large intestine without perforation or abscess without bleeding: Secondary | ICD-10-CM | POA: Diagnosis not present

## 2011-07-24 DIAGNOSIS — Z09 Encounter for follow-up examination after completed treatment for conditions other than malignant neoplasm: Secondary | ICD-10-CM | POA: Diagnosis not present

## 2011-07-24 DIAGNOSIS — Z86010 Personal history of colonic polyps: Secondary | ICD-10-CM | POA: Diagnosis not present

## 2011-07-30 DIAGNOSIS — R413 Other amnesia: Secondary | ICD-10-CM | POA: Diagnosis not present

## 2011-07-30 DIAGNOSIS — Z0000 Encounter for general adult medical examination without abnormal findings: Secondary | ICD-10-CM | POA: Diagnosis not present

## 2011-07-31 DIAGNOSIS — I4949 Other premature depolarization: Secondary | ICD-10-CM | POA: Diagnosis not present

## 2011-07-31 DIAGNOSIS — I10 Essential (primary) hypertension: Secondary | ICD-10-CM | POA: Diagnosis not present

## 2011-07-31 DIAGNOSIS — I059 Rheumatic mitral valve disease, unspecified: Secondary | ICD-10-CM | POA: Diagnosis not present

## 2011-07-31 DIAGNOSIS — Z7901 Long term (current) use of anticoagulants: Secondary | ICD-10-CM | POA: Diagnosis not present

## 2011-07-31 DIAGNOSIS — I4891 Unspecified atrial fibrillation: Secondary | ICD-10-CM | POA: Diagnosis not present

## 2011-08-14 DIAGNOSIS — Z7901 Long term (current) use of anticoagulants: Secondary | ICD-10-CM | POA: Diagnosis not present

## 2011-08-14 DIAGNOSIS — I4949 Other premature depolarization: Secondary | ICD-10-CM | POA: Diagnosis not present

## 2011-08-14 DIAGNOSIS — I10 Essential (primary) hypertension: Secondary | ICD-10-CM | POA: Diagnosis not present

## 2011-08-14 DIAGNOSIS — H2510 Age-related nuclear cataract, unspecified eye: Secondary | ICD-10-CM | POA: Diagnosis not present

## 2011-08-14 DIAGNOSIS — I059 Rheumatic mitral valve disease, unspecified: Secondary | ICD-10-CM | POA: Diagnosis not present

## 2011-08-14 DIAGNOSIS — IMO0002 Reserved for concepts with insufficient information to code with codable children: Secondary | ICD-10-CM

## 2011-08-14 DIAGNOSIS — I4891 Unspecified atrial fibrillation: Secondary | ICD-10-CM | POA: Diagnosis not present

## 2011-08-14 HISTORY — DX: Reserved for concepts with insufficient information to code with codable children: IMO0002

## 2011-08-21 DIAGNOSIS — L723 Sebaceous cyst: Secondary | ICD-10-CM | POA: Diagnosis not present

## 2011-08-21 DIAGNOSIS — L819 Disorder of pigmentation, unspecified: Secondary | ICD-10-CM | POA: Diagnosis not present

## 2011-08-21 DIAGNOSIS — L570 Actinic keratosis: Secondary | ICD-10-CM | POA: Diagnosis not present

## 2011-08-21 DIAGNOSIS — D239 Other benign neoplasm of skin, unspecified: Secondary | ICD-10-CM | POA: Diagnosis not present

## 2011-08-21 DIAGNOSIS — L821 Other seborrheic keratosis: Secondary | ICD-10-CM | POA: Diagnosis not present

## 2011-08-21 DIAGNOSIS — D485 Neoplasm of uncertain behavior of skin: Secondary | ICD-10-CM | POA: Diagnosis not present

## 2011-08-27 DIAGNOSIS — Z79899 Other long term (current) drug therapy: Secondary | ICD-10-CM | POA: Diagnosis not present

## 2011-08-28 DIAGNOSIS — Z79899 Other long term (current) drug therapy: Secondary | ICD-10-CM | POA: Diagnosis not present

## 2011-08-28 DIAGNOSIS — I4949 Other premature depolarization: Secondary | ICD-10-CM | POA: Diagnosis not present

## 2011-08-28 DIAGNOSIS — E785 Hyperlipidemia, unspecified: Secondary | ICD-10-CM | POA: Diagnosis not present

## 2011-08-28 DIAGNOSIS — Z7901 Long term (current) use of anticoagulants: Secondary | ICD-10-CM | POA: Diagnosis not present

## 2011-08-28 DIAGNOSIS — I059 Rheumatic mitral valve disease, unspecified: Secondary | ICD-10-CM | POA: Diagnosis not present

## 2011-08-28 DIAGNOSIS — I10 Essential (primary) hypertension: Secondary | ICD-10-CM | POA: Diagnosis not present

## 2011-08-28 DIAGNOSIS — I4891 Unspecified atrial fibrillation: Secondary | ICD-10-CM | POA: Diagnosis not present

## 2011-09-04 DIAGNOSIS — Z7901 Long term (current) use of anticoagulants: Secondary | ICD-10-CM | POA: Diagnosis not present

## 2011-09-17 DIAGNOSIS — Z1231 Encounter for screening mammogram for malignant neoplasm of breast: Secondary | ICD-10-CM | POA: Diagnosis not present

## 2011-09-18 DIAGNOSIS — I059 Rheumatic mitral valve disease, unspecified: Secondary | ICD-10-CM | POA: Diagnosis not present

## 2011-09-18 DIAGNOSIS — I4891 Unspecified atrial fibrillation: Secondary | ICD-10-CM | POA: Diagnosis not present

## 2011-09-18 DIAGNOSIS — Z1231 Encounter for screening mammogram for malignant neoplasm of breast: Secondary | ICD-10-CM | POA: Diagnosis not present

## 2011-09-18 DIAGNOSIS — I4949 Other premature depolarization: Secondary | ICD-10-CM | POA: Diagnosis not present

## 2011-09-18 DIAGNOSIS — I10 Essential (primary) hypertension: Secondary | ICD-10-CM | POA: Diagnosis not present

## 2011-09-18 DIAGNOSIS — N6489 Other specified disorders of breast: Secondary | ICD-10-CM | POA: Diagnosis not present

## 2011-09-18 DIAGNOSIS — Z7901 Long term (current) use of anticoagulants: Secondary | ICD-10-CM | POA: Diagnosis not present

## 2011-09-19 DIAGNOSIS — N390 Urinary tract infection, site not specified: Secondary | ICD-10-CM | POA: Diagnosis not present

## 2011-09-19 DIAGNOSIS — Z124 Encounter for screening for malignant neoplasm of cervix: Secondary | ICD-10-CM | POA: Diagnosis not present

## 2011-09-19 DIAGNOSIS — Z01419 Encounter for gynecological examination (general) (routine) without abnormal findings: Secondary | ICD-10-CM | POA: Diagnosis not present

## 2011-09-27 DIAGNOSIS — I4949 Other premature depolarization: Secondary | ICD-10-CM | POA: Diagnosis not present

## 2011-09-27 DIAGNOSIS — I10 Essential (primary) hypertension: Secondary | ICD-10-CM | POA: Diagnosis not present

## 2011-09-27 DIAGNOSIS — I059 Rheumatic mitral valve disease, unspecified: Secondary | ICD-10-CM | POA: Diagnosis not present

## 2011-09-27 DIAGNOSIS — Z7901 Long term (current) use of anticoagulants: Secondary | ICD-10-CM | POA: Diagnosis not present

## 2011-09-27 DIAGNOSIS — I4891 Unspecified atrial fibrillation: Secondary | ICD-10-CM | POA: Diagnosis not present

## 2011-10-11 DIAGNOSIS — I10 Essential (primary) hypertension: Secondary | ICD-10-CM | POA: Diagnosis not present

## 2011-10-11 DIAGNOSIS — I4949 Other premature depolarization: Secondary | ICD-10-CM | POA: Diagnosis not present

## 2011-10-11 DIAGNOSIS — Z7901 Long term (current) use of anticoagulants: Secondary | ICD-10-CM | POA: Diagnosis not present

## 2011-10-11 DIAGNOSIS — I059 Rheumatic mitral valve disease, unspecified: Secondary | ICD-10-CM | POA: Diagnosis not present

## 2011-10-11 DIAGNOSIS — I4891 Unspecified atrial fibrillation: Secondary | ICD-10-CM | POA: Diagnosis not present

## 2011-10-24 DIAGNOSIS — Z7901 Long term (current) use of anticoagulants: Secondary | ICD-10-CM | POA: Diagnosis not present

## 2011-10-24 DIAGNOSIS — I10 Essential (primary) hypertension: Secondary | ICD-10-CM | POA: Diagnosis not present

## 2011-10-24 DIAGNOSIS — I4949 Other premature depolarization: Secondary | ICD-10-CM | POA: Diagnosis not present

## 2011-10-24 DIAGNOSIS — I059 Rheumatic mitral valve disease, unspecified: Secondary | ICD-10-CM | POA: Diagnosis not present

## 2011-10-24 DIAGNOSIS — I4891 Unspecified atrial fibrillation: Secondary | ICD-10-CM | POA: Diagnosis not present

## 2011-10-29 DIAGNOSIS — R413 Other amnesia: Secondary | ICD-10-CM | POA: Diagnosis not present

## 2011-10-29 DIAGNOSIS — I10 Essential (primary) hypertension: Secondary | ICD-10-CM | POA: Diagnosis not present

## 2011-11-14 DIAGNOSIS — I4949 Other premature depolarization: Secondary | ICD-10-CM | POA: Diagnosis not present

## 2011-11-14 DIAGNOSIS — I4891 Unspecified atrial fibrillation: Secondary | ICD-10-CM | POA: Diagnosis not present

## 2011-11-14 DIAGNOSIS — I059 Rheumatic mitral valve disease, unspecified: Secondary | ICD-10-CM | POA: Diagnosis not present

## 2011-11-14 DIAGNOSIS — Z7901 Long term (current) use of anticoagulants: Secondary | ICD-10-CM | POA: Diagnosis not present

## 2011-11-14 DIAGNOSIS — I10 Essential (primary) hypertension: Secondary | ICD-10-CM | POA: Diagnosis not present

## 2011-11-21 DIAGNOSIS — I4949 Other premature depolarization: Secondary | ICD-10-CM | POA: Diagnosis not present

## 2011-11-21 DIAGNOSIS — I4891 Unspecified atrial fibrillation: Secondary | ICD-10-CM | POA: Diagnosis not present

## 2011-11-21 DIAGNOSIS — Z7901 Long term (current) use of anticoagulants: Secondary | ICD-10-CM | POA: Diagnosis not present

## 2011-11-21 DIAGNOSIS — I10 Essential (primary) hypertension: Secondary | ICD-10-CM | POA: Diagnosis not present

## 2011-11-21 DIAGNOSIS — I059 Rheumatic mitral valve disease, unspecified: Secondary | ICD-10-CM | POA: Diagnosis not present

## 2011-12-05 DIAGNOSIS — I4891 Unspecified atrial fibrillation: Secondary | ICD-10-CM | POA: Diagnosis not present

## 2011-12-05 DIAGNOSIS — I4949 Other premature depolarization: Secondary | ICD-10-CM | POA: Diagnosis not present

## 2011-12-05 DIAGNOSIS — Z7901 Long term (current) use of anticoagulants: Secondary | ICD-10-CM | POA: Diagnosis not present

## 2011-12-05 DIAGNOSIS — I10 Essential (primary) hypertension: Secondary | ICD-10-CM | POA: Diagnosis not present

## 2011-12-05 DIAGNOSIS — I059 Rheumatic mitral valve disease, unspecified: Secondary | ICD-10-CM | POA: Diagnosis not present

## 2011-12-18 DIAGNOSIS — I4949 Other premature depolarization: Secondary | ICD-10-CM | POA: Diagnosis not present

## 2011-12-18 DIAGNOSIS — I4891 Unspecified atrial fibrillation: Secondary | ICD-10-CM | POA: Diagnosis not present

## 2011-12-18 DIAGNOSIS — I059 Rheumatic mitral valve disease, unspecified: Secondary | ICD-10-CM | POA: Diagnosis not present

## 2011-12-18 DIAGNOSIS — Z7901 Long term (current) use of anticoagulants: Secondary | ICD-10-CM | POA: Diagnosis not present

## 2011-12-18 DIAGNOSIS — I10 Essential (primary) hypertension: Secondary | ICD-10-CM | POA: Diagnosis not present

## 2011-12-19 DIAGNOSIS — N63 Unspecified lump in unspecified breast: Secondary | ICD-10-CM | POA: Diagnosis not present

## 2012-01-15 DIAGNOSIS — I4891 Unspecified atrial fibrillation: Secondary | ICD-10-CM | POA: Diagnosis not present

## 2012-01-15 DIAGNOSIS — I10 Essential (primary) hypertension: Secondary | ICD-10-CM | POA: Diagnosis not present

## 2012-01-15 DIAGNOSIS — Z7901 Long term (current) use of anticoagulants: Secondary | ICD-10-CM | POA: Diagnosis not present

## 2012-01-15 DIAGNOSIS — I059 Rheumatic mitral valve disease, unspecified: Secondary | ICD-10-CM | POA: Diagnosis not present

## 2012-01-15 DIAGNOSIS — I4949 Other premature depolarization: Secondary | ICD-10-CM | POA: Diagnosis not present

## 2012-01-21 ENCOUNTER — Other Ambulatory Visit: Payer: Self-pay | Admitting: Cardiology

## 2012-01-21 ENCOUNTER — Ambulatory Visit
Admission: RE | Admit: 2012-01-21 | Discharge: 2012-01-21 | Disposition: A | Discharge status: Home / Self Care | Payer: Self-pay | Source: Ambulatory Visit | Attending: Cardiology | Admitting: Cardiology

## 2012-01-21 DIAGNOSIS — I4949 Other premature depolarization: Secondary | ICD-10-CM | POA: Diagnosis not present

## 2012-01-21 DIAGNOSIS — I059 Rheumatic mitral valve disease, unspecified: Secondary | ICD-10-CM | POA: Diagnosis not present

## 2012-01-21 DIAGNOSIS — J449 Chronic obstructive pulmonary disease, unspecified: Secondary | ICD-10-CM | POA: Diagnosis not present

## 2012-01-21 DIAGNOSIS — Z79899 Other long term (current) drug therapy: Secondary | ICD-10-CM | POA: Diagnosis not present

## 2012-01-21 DIAGNOSIS — E785 Hyperlipidemia, unspecified: Secondary | ICD-10-CM | POA: Diagnosis not present

## 2012-01-21 DIAGNOSIS — Z7901 Long term (current) use of anticoagulants: Secondary | ICD-10-CM | POA: Diagnosis not present

## 2012-01-21 DIAGNOSIS — I4891 Unspecified atrial fibrillation: Secondary | ICD-10-CM | POA: Diagnosis not present

## 2012-01-21 DIAGNOSIS — R0989 Other specified symptoms and signs involving the circulatory and respiratory systems: Secondary | ICD-10-CM | POA: Diagnosis not present

## 2012-01-21 DIAGNOSIS — R0602 Shortness of breath: Secondary | ICD-10-CM | POA: Diagnosis not present

## 2012-01-21 DIAGNOSIS — I10 Essential (primary) hypertension: Secondary | ICD-10-CM | POA: Diagnosis not present

## 2012-01-21 DIAGNOSIS — R0609 Other forms of dyspnea: Secondary | ICD-10-CM

## 2012-01-31 DIAGNOSIS — M1710 Unilateral primary osteoarthritis, unspecified knee: Secondary | ICD-10-CM | POA: Diagnosis not present

## 2012-02-06 DIAGNOSIS — R0609 Other forms of dyspnea: Secondary | ICD-10-CM | POA: Diagnosis not present

## 2012-02-06 DIAGNOSIS — Z7901 Long term (current) use of anticoagulants: Secondary | ICD-10-CM | POA: Diagnosis not present

## 2012-02-06 DIAGNOSIS — I4949 Other premature depolarization: Secondary | ICD-10-CM | POA: Diagnosis not present

## 2012-02-06 DIAGNOSIS — I059 Rheumatic mitral valve disease, unspecified: Secondary | ICD-10-CM | POA: Diagnosis not present

## 2012-02-06 DIAGNOSIS — I4891 Unspecified atrial fibrillation: Secondary | ICD-10-CM | POA: Diagnosis not present

## 2012-02-06 DIAGNOSIS — I10 Essential (primary) hypertension: Secondary | ICD-10-CM | POA: Diagnosis not present

## 2012-02-13 DIAGNOSIS — R0602 Shortness of breath: Secondary | ICD-10-CM | POA: Diagnosis not present

## 2012-02-13 DIAGNOSIS — R413 Other amnesia: Secondary | ICD-10-CM | POA: Diagnosis not present

## 2012-02-13 DIAGNOSIS — I10 Essential (primary) hypertension: Secondary | ICD-10-CM | POA: Diagnosis not present

## 2012-02-20 DIAGNOSIS — I4891 Unspecified atrial fibrillation: Secondary | ICD-10-CM | POA: Diagnosis not present

## 2012-02-20 DIAGNOSIS — I059 Rheumatic mitral valve disease, unspecified: Secondary | ICD-10-CM | POA: Diagnosis not present

## 2012-02-20 DIAGNOSIS — I10 Essential (primary) hypertension: Secondary | ICD-10-CM | POA: Diagnosis not present

## 2012-02-20 DIAGNOSIS — R0609 Other forms of dyspnea: Secondary | ICD-10-CM | POA: Diagnosis not present

## 2012-02-20 DIAGNOSIS — Z7901 Long term (current) use of anticoagulants: Secondary | ICD-10-CM | POA: Diagnosis not present

## 2012-02-20 DIAGNOSIS — R0989 Other specified symptoms and signs involving the circulatory and respiratory systems: Secondary | ICD-10-CM | POA: Diagnosis not present

## 2012-02-20 DIAGNOSIS — I4949 Other premature depolarization: Secondary | ICD-10-CM | POA: Diagnosis not present

## 2012-03-02 DIAGNOSIS — R413 Other amnesia: Secondary | ICD-10-CM | POA: Diagnosis not present

## 2012-03-02 DIAGNOSIS — I10 Essential (primary) hypertension: Secondary | ICD-10-CM | POA: Diagnosis not present

## 2012-03-17 DIAGNOSIS — Z23 Encounter for immunization: Secondary | ICD-10-CM | POA: Diagnosis not present

## 2012-03-18 DIAGNOSIS — I4949 Other premature depolarization: Secondary | ICD-10-CM | POA: Diagnosis not present

## 2012-03-18 DIAGNOSIS — I10 Essential (primary) hypertension: Secondary | ICD-10-CM | POA: Diagnosis not present

## 2012-03-18 DIAGNOSIS — I4891 Unspecified atrial fibrillation: Secondary | ICD-10-CM | POA: Diagnosis not present

## 2012-03-18 DIAGNOSIS — R0609 Other forms of dyspnea: Secondary | ICD-10-CM | POA: Diagnosis not present

## 2012-03-18 DIAGNOSIS — I059 Rheumatic mitral valve disease, unspecified: Secondary | ICD-10-CM | POA: Diagnosis not present

## 2012-03-18 DIAGNOSIS — Z7901 Long term (current) use of anticoagulants: Secondary | ICD-10-CM | POA: Diagnosis not present

## 2012-04-10 DIAGNOSIS — R413 Other amnesia: Secondary | ICD-10-CM | POA: Diagnosis not present

## 2012-04-10 DIAGNOSIS — I10 Essential (primary) hypertension: Secondary | ICD-10-CM | POA: Diagnosis not present

## 2012-04-15 DIAGNOSIS — I059 Rheumatic mitral valve disease, unspecified: Secondary | ICD-10-CM | POA: Diagnosis not present

## 2012-04-15 DIAGNOSIS — R0989 Other specified symptoms and signs involving the circulatory and respiratory systems: Secondary | ICD-10-CM | POA: Diagnosis not present

## 2012-04-15 DIAGNOSIS — R0609 Other forms of dyspnea: Secondary | ICD-10-CM | POA: Diagnosis not present

## 2012-04-15 DIAGNOSIS — I10 Essential (primary) hypertension: Secondary | ICD-10-CM | POA: Diagnosis not present

## 2012-04-15 DIAGNOSIS — I4891 Unspecified atrial fibrillation: Secondary | ICD-10-CM | POA: Diagnosis not present

## 2012-04-15 DIAGNOSIS — I4949 Other premature depolarization: Secondary | ICD-10-CM | POA: Diagnosis not present

## 2012-04-15 DIAGNOSIS — Z7901 Long term (current) use of anticoagulants: Secondary | ICD-10-CM | POA: Diagnosis not present

## 2012-04-29 DIAGNOSIS — I4949 Other premature depolarization: Secondary | ICD-10-CM | POA: Diagnosis not present

## 2012-04-29 DIAGNOSIS — Z7901 Long term (current) use of anticoagulants: Secondary | ICD-10-CM | POA: Diagnosis not present

## 2012-04-29 DIAGNOSIS — I059 Rheumatic mitral valve disease, unspecified: Secondary | ICD-10-CM | POA: Diagnosis not present

## 2012-04-29 DIAGNOSIS — R0989 Other specified symptoms and signs involving the circulatory and respiratory systems: Secondary | ICD-10-CM | POA: Diagnosis not present

## 2012-04-29 DIAGNOSIS — I4891 Unspecified atrial fibrillation: Secondary | ICD-10-CM | POA: Diagnosis not present

## 2012-04-29 DIAGNOSIS — I10 Essential (primary) hypertension: Secondary | ICD-10-CM | POA: Diagnosis not present

## 2012-05-12 DIAGNOSIS — H2510 Age-related nuclear cataract, unspecified eye: Secondary | ICD-10-CM | POA: Diagnosis not present

## 2012-05-27 DIAGNOSIS — I10 Essential (primary) hypertension: Secondary | ICD-10-CM | POA: Diagnosis not present

## 2012-05-27 DIAGNOSIS — I4891 Unspecified atrial fibrillation: Secondary | ICD-10-CM | POA: Diagnosis not present

## 2012-05-27 DIAGNOSIS — Z7901 Long term (current) use of anticoagulants: Secondary | ICD-10-CM | POA: Diagnosis not present

## 2012-05-27 DIAGNOSIS — R0609 Other forms of dyspnea: Secondary | ICD-10-CM | POA: Diagnosis not present

## 2012-05-27 DIAGNOSIS — R0989 Other specified symptoms and signs involving the circulatory and respiratory systems: Secondary | ICD-10-CM | POA: Diagnosis not present

## 2012-05-27 DIAGNOSIS — I059 Rheumatic mitral valve disease, unspecified: Secondary | ICD-10-CM | POA: Diagnosis not present

## 2012-05-27 DIAGNOSIS — I4949 Other premature depolarization: Secondary | ICD-10-CM | POA: Diagnosis not present

## 2012-06-24 DIAGNOSIS — I059 Rheumatic mitral valve disease, unspecified: Secondary | ICD-10-CM | POA: Diagnosis not present

## 2012-06-24 DIAGNOSIS — I10 Essential (primary) hypertension: Secondary | ICD-10-CM | POA: Diagnosis not present

## 2012-06-24 DIAGNOSIS — I4949 Other premature depolarization: Secondary | ICD-10-CM | POA: Diagnosis not present

## 2012-06-24 DIAGNOSIS — R0989 Other specified symptoms and signs involving the circulatory and respiratory systems: Secondary | ICD-10-CM | POA: Diagnosis not present

## 2012-06-24 DIAGNOSIS — I4891 Unspecified atrial fibrillation: Secondary | ICD-10-CM | POA: Diagnosis not present

## 2012-06-24 DIAGNOSIS — Z7901 Long term (current) use of anticoagulants: Secondary | ICD-10-CM | POA: Diagnosis not present

## 2012-07-20 DIAGNOSIS — H2510 Age-related nuclear cataract, unspecified eye: Secondary | ICD-10-CM | POA: Diagnosis not present

## 2012-07-20 DIAGNOSIS — H269 Unspecified cataract: Secondary | ICD-10-CM | POA: Diagnosis not present

## 2012-08-01 ENCOUNTER — Inpatient Hospital Stay (HOSPITAL_COMMUNITY)
Admission: EM | Admit: 2012-08-01 | Discharge: 2012-08-04 | DRG: 312 | Disposition: A | Discharge status: Skilled Nursing Facility | Payer: Medicare Other | Attending: Internal Medicine | Admitting: Internal Medicine

## 2012-08-01 ENCOUNTER — Emergency Department (HOSPITAL_COMMUNITY): Payer: Medicare Other

## 2012-08-01 ENCOUNTER — Encounter (HOSPITAL_COMMUNITY): Payer: Self-pay | Admitting: *Deleted

## 2012-08-01 DIAGNOSIS — S79929A Unspecified injury of unspecified thigh, initial encounter: Secondary | ICD-10-CM | POA: Diagnosis not present

## 2012-08-01 DIAGNOSIS — F0390 Unspecified dementia without behavioral disturbance: Secondary | ICD-10-CM

## 2012-08-01 DIAGNOSIS — S8010XA Contusion of unspecified lower leg, initial encounter: Secondary | ICD-10-CM | POA: Diagnosis present

## 2012-08-01 DIAGNOSIS — R55 Syncope and collapse: Secondary | ICD-10-CM

## 2012-08-01 DIAGNOSIS — I059 Rheumatic mitral valve disease, unspecified: Secondary | ICD-10-CM | POA: Diagnosis present

## 2012-08-01 DIAGNOSIS — S79919A Unspecified injury of unspecified hip, initial encounter: Secondary | ICD-10-CM | POA: Diagnosis not present

## 2012-08-01 DIAGNOSIS — I509 Heart failure, unspecified: Secondary | ICD-10-CM | POA: Diagnosis not present

## 2012-08-01 DIAGNOSIS — Y92009 Unspecified place in unspecified non-institutional (private) residence as the place of occurrence of the external cause: Secondary | ICD-10-CM | POA: Diagnosis not present

## 2012-08-01 DIAGNOSIS — W19XXXA Unspecified fall, initial encounter: Secondary | ICD-10-CM | POA: Diagnosis not present

## 2012-08-01 DIAGNOSIS — I119 Hypertensive heart disease without heart failure: Secondary | ICD-10-CM

## 2012-08-01 DIAGNOSIS — R6889 Other general symptoms and signs: Secondary | ICD-10-CM | POA: Diagnosis not present

## 2012-08-01 DIAGNOSIS — I951 Orthostatic hypotension: Principal | ICD-10-CM

## 2012-08-01 DIAGNOSIS — Z96649 Presence of unspecified artificial hip joint: Secondary | ICD-10-CM

## 2012-08-01 DIAGNOSIS — Z96643 Presence of artificial hip joint, bilateral: Secondary | ICD-10-CM

## 2012-08-01 DIAGNOSIS — S7010XA Contusion of unspecified thigh, initial encounter: Secondary | ICD-10-CM | POA: Diagnosis present

## 2012-08-01 DIAGNOSIS — F40298 Other specified phobia: Secondary | ICD-10-CM | POA: Diagnosis present

## 2012-08-01 DIAGNOSIS — I340 Nonrheumatic mitral (valve) insufficiency: Secondary | ICD-10-CM

## 2012-08-01 DIAGNOSIS — M25559 Pain in unspecified hip: Secondary | ICD-10-CM | POA: Diagnosis not present

## 2012-08-01 DIAGNOSIS — I110 Hypertensive heart disease with heart failure: Secondary | ICD-10-CM | POA: Diagnosis not present

## 2012-08-01 DIAGNOSIS — I10 Essential (primary) hypertension: Secondary | ICD-10-CM

## 2012-08-01 DIAGNOSIS — S73006A Unspecified dislocation of unspecified hip, initial encounter: Secondary | ICD-10-CM | POA: Diagnosis not present

## 2012-08-01 DIAGNOSIS — I4891 Unspecified atrial fibrillation: Secondary | ICD-10-CM

## 2012-08-01 HISTORY — DX: Essential (primary) hypertension: I10

## 2012-08-01 HISTORY — DX: Unspecified dementia, unspecified severity, without behavioral disturbance, psychotic disturbance, mood disturbance, and anxiety: F03.90

## 2012-08-01 HISTORY — DX: Unspecified osteoarthritis, unspecified site: M19.90

## 2012-08-01 HISTORY — DX: Cardiac murmur, unspecified: R01.1

## 2012-08-01 HISTORY — DX: Unspecified atrial fibrillation: I48.91

## 2012-08-01 LAB — BASIC METABOLIC PANEL
Chloride: 102 mEq/L (ref 96–112)
GFR calc Af Amer: >90 mL/min (ref >90)
Potassium: 3.7 mEq/L (ref 3.5–5.1)
Sodium: 137 mEq/L (ref 135–145)

## 2012-08-01 LAB — URINALYSIS, ROUTINE W REFLEX MICROSCOPIC
Bilirubin Urine: NEGATIVE
Hgb urine dipstick: NEGATIVE
Ketones, ur: NEGATIVE mg/dL
Nitrite: NEGATIVE
Urobilinogen, UA: 0.2 mg/dL (ref 0.0–1.0)

## 2012-08-01 LAB — CBC
HCT: 34.5 % — ABNORMAL LOW (ref 36.0–46.0)
Hemoglobin: 11.5 g/dL — ABNORMAL LOW (ref 12.0–15.0)
MCH: 30 pg (ref 26.0–34.0)
Platelets: 233 10*3/uL (ref 150–400)
RBC: 3.83 MIL/uL — ABNORMAL LOW (ref 3.87–5.11)
RDW: 13.8 % (ref 11.5–15.5)

## 2012-08-01 LAB — PROTIME-INR
INR: 2.42 — ABNORMAL HIGH (ref 0.00–1.49)
Prothrombin Time: 25.2 seconds — ABNORMAL HIGH (ref 11.6–15.2)

## 2012-08-01 LAB — SAMPLE TO BLOOD BANK

## 2012-08-01 MED ORDER — MORPHINE SULFATE 2 MG/ML IJ SOLN
1.0000 mg | INTRAMUSCULAR | Status: DC | PRN
  Administered 2012-08-01 – 2012-08-02 (×5): 1 mg via INTRAVENOUS
  Filled 2012-08-01 (×5): qty 1

## 2012-08-01 MED ORDER — ADULT MULTIVITAMIN W/MINERALS CH
1.0000 | ORAL_TABLET | Freq: Every day | ORAL | Status: DC
  Administered 2012-08-01 – 2012-08-04 (×4): 1 via ORAL
  Filled 2012-08-01 (×4): qty 1

## 2012-08-01 MED ORDER — BESIFLOXACIN HCL 0.6 % OP SUSP
1.0000 [drp] | Freq: Three times a day (TID) | OPHTHALMIC | Status: DC
  Administered 2012-08-01 – 2012-08-04 (×9): 1 [drp] via OPHTHALMIC
  Filled 2012-08-01: qty 0.1

## 2012-08-01 MED ORDER — GATIFLOXACIN 0.5 % OP SOLN
1.0000 [drp] | Freq: Four times a day (QID) | OPHTHALMIC | Status: DC
  Filled 2012-08-01: qty 2.5

## 2012-08-01 MED ORDER — CALCIUM CARBONATE-VITAMIN D 500-200 MG-UNIT PO TABS
1.0000 | ORAL_TABLET | Freq: Two times a day (BID) | ORAL | Status: DC
  Administered 2012-08-01 – 2012-08-04 (×6): 1 via ORAL
  Filled 2012-08-01 (×7): qty 1

## 2012-08-01 MED ORDER — ALPRAZOLAM 0.25 MG PO TABS
0.2500 mg | ORAL_TABLET | Freq: Three times a day (TID) | ORAL | Status: DC | PRN
  Administered 2012-08-01 – 2012-08-03 (×4): 0.25 mg via ORAL
  Filled 2012-08-01 (×4): qty 1

## 2012-08-01 MED ORDER — ACETAMINOPHEN 325 MG PO TABS: 650.0000 mg | ORAL_TABLET | Freq: Four times a day (QID) | ORAL | Status: DC | PRN

## 2012-08-01 MED ORDER — WARFARIN SODIUM 6 MG PO TABS
6.0000 mg | ORAL_TABLET | Freq: Every day | ORAL | Status: DC
  Administered 2012-08-01: 6 mg via ORAL
  Filled 2012-08-01 (×2): qty 1

## 2012-08-01 MED ORDER — ACETAMINOPHEN 325 MG PO TABS
650.0000 mg | ORAL_TABLET | Freq: Once | ORAL | Status: AC
  Administered 2012-08-01: 650 mg via ORAL
  Filled 2012-08-01: qty 2

## 2012-08-01 MED ORDER — ONDANSETRON HCL 4 MG/2ML IJ SOLN: 4.0000 mg | Freq: Four times a day (QID) | INTRAMUSCULAR | Status: DC | PRN

## 2012-08-01 MED ORDER — DOFETILIDE 250 MCG PO CAPS
250.0000 ug | ORAL_CAPSULE | Freq: Two times a day (BID) | ORAL | Status: DC
  Administered 2012-08-01 – 2012-08-04 (×6): 250 ug via ORAL
  Filled 2012-08-01 (×7): qty 1

## 2012-08-01 MED ORDER — FENTANYL CITRATE 0.05 MG/ML IJ SOLN
50.0000 ug | Freq: Once | INTRAMUSCULAR | Status: AC
  Administered 2012-08-01: 50 ug via INTRAVENOUS
  Filled 2012-08-01: qty 2

## 2012-08-01 MED ORDER — NEPAFENAC 0.1 % OP SUSP
1.0000 [drp] | Freq: Three times a day (TID) | OPHTHALMIC | Status: DC
  Filled 2012-08-01: qty 3

## 2012-08-01 MED ORDER — RIVASTIGMINE 9.5 MG/24HR TD PT24
9.5000 mg | MEDICATED_PATCH | Freq: Every day | TRANSDERMAL | Status: DC
  Administered 2012-08-01 – 2012-08-04 (×3): 9.5 mg via TRANSDERMAL
  Filled 2012-08-01 (×4): qty 1

## 2012-08-01 MED ORDER — BISOPROLOL FUMARATE 10 MG PO TABS
20.0000 mg | ORAL_TABLET | Freq: Every day | ORAL | Status: DC
  Administered 2012-08-01 – 2012-08-04 (×4): 20 mg via ORAL
  Filled 2012-08-01 (×4): qty 2

## 2012-08-01 MED ORDER — NEPAFENAC 0.1 % OP SUSP
1.0000 [drp] | Freq: Three times a day (TID) | OPHTHALMIC | Status: DC
  Administered 2012-08-01 – 2012-08-04 (×9): 1 [drp] via OPHTHALMIC
  Filled 2012-08-01: qty 3

## 2012-08-01 MED ORDER — ALPRAZOLAM 0.25 MG PO TABS: 0.2500 mg | ORAL_TABLET | Freq: Three times a day (TID) | ORAL | Status: DC | PRN

## 2012-08-01 MED ORDER — CALCIUM CARBONATE-VITAMIN D 250-125 MG-UNIT PO TABS: 2.0000 | ORAL_TABLET | Freq: Two times a day (BID) | ORAL | Status: DC

## 2012-08-01 MED ORDER — SODIUM CHLORIDE 0.9 % IV SOLN
Freq: Once | INTRAVENOUS | Status: AC
  Administered 2012-08-01: 14:00:00 via INTRAVENOUS

## 2012-08-01 MED ORDER — WARFARIN - PHYSICIAN DOSING INPATIENT: Freq: Every day | Status: DC

## 2012-08-01 MED ORDER — SODIUM CHLORIDE 0.9 % IV SOLN
INTRAVENOUS | Status: DC
  Administered 2012-08-01 – 2012-08-03 (×2): via INTRAVENOUS

## 2012-08-01 MED ORDER — DIFLUPREDNATE 0.05 % OP EMUL
1.0000 [drp] | Freq: Two times a day (BID) | OPHTHALMIC | Status: DC
  Administered 2012-08-01 – 2012-08-04 (×6): 1 [drp] via OPHTHALMIC
  Filled 2012-08-01: qty 0.1

## 2012-08-01 MED ORDER — BESIFLOXACIN HCL 0.6 % OP SUSP: 1.0000 [drp] | Freq: Three times a day (TID) | OPHTHALMIC | Status: DC

## 2012-08-01 MED ORDER — MORPHINE SULFATE 4 MG/ML IJ SOLN
2.0000 mg | Freq: Once | INTRAMUSCULAR | Status: AC
  Administered 2012-08-01: 2 mg via INTRAVENOUS
  Filled 2012-08-01: qty 1

## 2012-08-01 MED ORDER — ONDANSETRON HCL 4 MG PO TABS: 4.0000 mg | ORAL_TABLET | Freq: Four times a day (QID) | ORAL | Status: DC | PRN

## 2012-08-01 MED ORDER — AMLODIPINE BESYLATE 2.5 MG PO TABS
2.5000 mg | ORAL_TABLET | Freq: Every day | ORAL | Status: DC
  Administered 2012-08-01: 2.5 mg via ORAL
  Filled 2012-08-01 (×2): qty 1

## 2012-08-01 MED ORDER — DIFLUPREDNATE 0.05 % OP EMUL: 1.0000 [drp] | Freq: Two times a day (BID) | OPHTHALMIC | Status: DC

## 2012-08-01 MED ORDER — ACETAMINOPHEN 650 MG RE SUPP: 650.0000 mg | Freq: Four times a day (QID) | RECTAL | Status: DC | PRN

## 2012-08-01 MED ORDER — SODIUM CHLORIDE 0.9 % IJ SOLN
3.0000 mL | Freq: Two times a day (BID) | INTRAMUSCULAR | Status: DC
  Administered 2012-08-01 – 2012-08-03 (×3): 3 mL via INTRAVENOUS

## 2012-08-01 MED ORDER — ESTRADIOL 0.05 MG/24HR TD PTWK
0.0500 mg | MEDICATED_PATCH | TRANSDERMAL | Status: DC
  Filled 2012-08-01: qty 1

## 2012-08-01 MED ORDER — SODIUM CHLORIDE 0.9 % IV BOLUS (SEPSIS)
500.0000 mL | Freq: Once | INTRAVENOUS | Status: AC
  Administered 2012-08-01: 500 mL via INTRAVENOUS

## 2012-08-01 NOTE — ED Notes (Addendum)
Pt presents to department for evaluation of R hip pain. States she fell getting out of bed, landed on both knees. Now states 7/10 pain at the time, increases with weight bearing and movement. No obvious deformity noted. States history of bilateral hip replacement. She is alert and oriented x4.

## 2012-08-01 NOTE — ED Notes (Signed)
Pt attempted to ambulate. Pt able to stand up but unsteady on feet. When attempting to walk, pt barely able to put pressure on right leg.

## 2012-08-01 NOTE — ED Provider Notes (Signed)
History     CSN: [id number]  Arrival date & time 08/01/12  7846   First MD Initiated Contact with Patient 08/01/12 [phone]      Chief Complaint  Patient presents with  . Hip Pain    (Consider location/radiation/quality/duration/timing/severity/associated sxs/prior treatment) HPI Comments: [name] twisted awkwardly and fell after getting out of bed.  She immediately experienced severe right hip pain and was unable to walk afterwards.  She denies striking her head or any LOC.  She denies any other injuries.  Patient is a 79 y.o. female presenting with hip pain. The history is provided by the patient. No language interpreter was used.  Hip Pain This is a new problem. The current episode started less than 1 hour ago. The problem occurs constantly. The problem has been gradually improving (after IV fentanyl en route to the ER.). Pertinent negatives include no chest pain, no abdominal pain, no headaches and no shortness of breath. The symptoms are aggravated by walking and bending (any movement). Relieved by: remaining perfectly still.    Past Medical History  Diagnosis Date  . Hypertension   . Atrial fibrillation   . Dementia     mild    Past Surgical History  Procedure Date  . Total hip arthroplasty     No family history on file.  History  Substance Use Topics  . Smoking status: Never Smoker   . Smokeless tobacco: Not on file  . Alcohol Use: No    OB History    Grav Para Term Preterm Abortions TAB SAB Ect Mult Living                  Review of Systems  Respiratory: Negative for shortness of breath.   Cardiovascular: Negative for chest pain.  Gastrointestinal: Negative for abdominal pain.  Neurological: Negative for headaches.  All other systems reviewed and are negative.    Allergies  Dilaudid and Vicodin  Home Medications  No current outpatient prescriptions on file.  BP 142/67  Pulse 65  Temp 98.2 F (36.8 C) (Oral)  Resp 18  SpO2 99%  Physical  Exam  Nursing note and vitals reviewed. Constitutional: She is oriented to person, place, and time. She appears well-developed and well-nourished. No distress.  HENT:  Head: Normocephalic and atraumatic.  Right Ear: External ear normal.  Left Ear: External ear normal.  Nose: Nose normal.  Mouth/Throat: Oropharynx is clear and moist.  Eyes: Conjunctivae normal are normal. Pupils are equal, round, and reactive to light. Right eye exhibits no discharge. Left eye exhibits no discharge. No scleral icterus.  Neck: Normal range of motion. Neck supple. No JVD present. No tracheal deviation present.  Cardiovascular: Normal rate, regular rhythm, normal heart sounds and intact distal pulses.  Exam reveals no gallop and no friction rub.   No murmur heard. Pulmonary/Chest: Effort normal and breath sounds normal. Stridor present. No respiratory distress. She has no wheezes. She has no rales. She exhibits no tenderness.  Abdominal: Soft. Bowel sounds are normal. She exhibits no distension and no mass. There is no tenderness. There is no rebound and no guarding.  Musculoskeletal: She exhibits tenderness. She exhibits no edema.       Right hip: She exhibits decreased range of motion, tenderness and deformity. She exhibits no swelling and no crepitus.       Left hip: Normal.       Right knee: Normal.       Left knee: Normal.  Right ankle: Normal.       Left ankle: Normal.       Note shortening of the right leg with internal rotation of the foot.  Pt has pain with attempted flexion at the hip.  The pelvis is stable.  Lymphadenopathy:    She has no cervical adenopathy.  Neurological: She is alert and oriented to person, place, and time.  Skin: Skin is warm and dry. No rash noted. She is not diaphoretic. No erythema. No pallor.  Psychiatric: She has a normal mood and affect. Her behavior is normal.    ED Course  Procedures (including critical care time)   Labs Reviewed  CBC  BASIC METABOLIC PANEL   PROTIME-INR  SAMPLE TO BLOOD BANK   No results found.   No diagnosis found.    MDM  Pt presents for evaluation of right hip pain after falling from bed.  She appears nontoxic, note stable VS, NAD.  She has a hx of bilat hip replacements and has severe pain with any movement of the right hip.  I anticipate a fx or disloc.  Will obtain basic labs, coags, and an xray of the hip.  Will treat pain with IV fentanyl.  Will reassess as results become available.  1335.  Discussed admission with Dr. Arthor Captain (hospitalist).  She had a witnessed near syncopal event and became hypotensive with mild mental status changes while trying to go to the bathroom.  On follow-up history, it is not very clear if last night's fall was a mechanical fall or a fall secondary to syncope or another process.  She is now again awake and oriented and does not appear toxic.  She has no focal deficits.  There is no radiographic evidence of a hip fracture.  She has been moving the hip actively and has pain along the superior, medial adducter compartment.  Will start IVF.  I have broadened her evaluation to include an EKG and trop.  Plan obs admit.   Date: 08/01/2012 @ 1334  Rate: 72 bpm  Rhythm: sinus  QRS Axis: left  Intervals: PR prolonged  ST/T Wave abnormalities: ST elevations anteriorly  Conduction Disutrbances:left bundle branch block  Narrative Interpretation:   Old EKG Reviewed: changes noted - although she has LBBB morphology on the 01/30/06 study, the QRS duration is not wide enough to be a LBBB          Tobin Chad, MD 08/01/12 1342

## 2012-08-01 NOTE — ED Notes (Addendum)
Walking down a hall to wake someone else. When she turned, graped rt. Hip and did not fall except to chair. There is deformity to the rt. Hip and shortening. X 2 doses of 50 mcq of fentanyl. Alert to self and place.

## 2012-08-01 NOTE — Progress Notes (Signed)
NURSING PROGRESS NOTE  SANIAH SCHROETER [id number] Admission Data: 08/01/2012 4:13 PM Attending Provider: Clydia Llano, MD NWG:[id number],VHQ Maisie Fus, MD Code Status: full  [name] is a 79 y.o. female patient admitted from ED  No acute distress noted.  No c/o shortness of breath, no c/o chest pain.  Cardiac tele # [phone], in place, cardiac monitor yields:normal sinus rhythm.  Blood pressure 143/73, pulse 69, temperature 98 F (36.7 C), temperature source Oral, resp. rate 18, height 5\' 7"  (1.702 m), weight 63.9 kg (140 lb 14 oz), SpO2 98.00%.   IV Fluids:  IV in place, occlusive dsg intact without redness, IV cath forearm right, condition patent and no redness normal saline.   Allergies:  Dilaudid and Vicodin  Past Medical History:   has a past medical history of Hypertension; Atrial fibrillation; and Dementia.  Past Surgical History:   has past surgical history that includes Total hip arthroplasty.  Social History:   reports that she has never smoked. She does not have any smokeless tobacco history on file. She reports that she does not drink alcohol or use illicit drugs.  Skin: dry and intact  Orientation to room, and floor completed with information packet given to patient/family. Admission INP armband ID verified with patient/family, and in place.   SR up x 2, fall assessment complete, with patient and family able to verbalize understanding of risk associated with falls, and verbalized understanding to call for assistance before getting out of bed.   Call light within reach. Patient able to voice and demonstrate understanding of unit orientation instructions.   Will cont to eval and treat per MD orders.  Rosalie Doctor, RN

## 2012-08-01 NOTE — H&P (Signed)
[facility] History and Physical  [name]:[id number] DOB: 08-31-1932 DOA: 08/01/2012  Referring physician: Tobin Chad, MD PCP: Ginette Otto, MD  Specialists: Viann Fish  Chief Complaint: Fall  HPI: [name] is a 79 y.o. female with past medical history of hypertension and atrial fibrillation dementia. Patient came in to the hospital after a fall. Patient was in her usual state of health until this morning when she was about to go to the bathroom, patient fell, and immediately she felt moderate to severe pain on her right hip. She does not have good recollection of what happened, the majority of the history was obtained from her husband and her son were at bedside at the time of the interview. Initial evaluation in the emergency department showed no evidence of UTI, no evidence of pneumonia and bilateral hip x-rays did show no evidence of fractures. Patient was about to be discharged home, and she was asked to to with assistance, she felt like she is going to fall and her blood pressure went down. She also got a dose of fentanyl just before that. Patient will be admitted to the hospital for further evaluation.   Review of Systems: Constitutional: Generalized weakness Eyes: negative for irritation, redness and visual disturbance Ears, nose, mouth, throat, and face: negative for earaches, epistaxis, nasal congestion and sore throat Respiratory: negative for cough, dyspnea on exertion, sputum and wheezing Cardiovascular: negative for chest pain, dyspnea, lower extremity edema, orthopnea, palpitations and syncope Gastrointestinal: negative for abdominal pain, constipation, diarrhea, melena, nausea and vomiting Genitourinary:negative for dysuria, frequency and hematuria Hematologic/lymphatic: negative for bleeding, easy bruising and lymphadenopathy Musculoskeletal:negative for arthralgias, muscle weakness and stiff joints Neurological: negative for  coordination problems, gait problems, headaches and weakness Endocrine: negative for diabetic symptoms including polydipsia, polyuria and weight loss Allergic/Immunologic: negative for anaphylaxis, hay fever and urticaria   Past Medical History  Diagnosis Date  . Hypertension   . Atrial fibrillation   . Dementia     mild   Past Surgical History  Procedure Date  . Total hip arthroplasty    Social History:  reports that she has never smoked. She does not have any smokeless tobacco history on file. She reports that she does not drink alcohol or use illicit drugs. Lives at home with her husband.  Allergies  Allergen Reactions  . Dilaudid (Hydromorphone Hcl) Other (See Comments)    hallucinations  . Vicodin (Hydrocodone-Acetaminophen) Other (See Comments)    hallucinations    No family history on file.  Prior to Admission medications   Medication Sig Start Date End Date Taking? Authorizing Provider  amLODipine (NORVASC) 2.5 MG tablet Take 2.5 mg by mouth daily.   Yes Historical Provider, MD  Besifloxacin HCl (BESIVANCE) 0.6 % SUSP Place 1 drop into the right eye 3 (three) times daily.   Yes Historical Provider, MD  bisoprolol (ZEBETA) 10 MG tablet Take 20 mg by mouth daily.   Yes Historical Provider, MD  Calcium Carbonate-Vitamin D (CALCIUM + D PO) Take 1 tablet by mouth 2 (two) times daily.   Yes Historical Provider, MD  Difluprednate (DUREZOL) 0.05 % EMUL Place 1 drop into the right eye 2 (two) times daily.   Yes Historical Provider, MD  dofetilide (TIKOSYN) 250 MCG capsule Take 250 mcg by mouth 2 (two) times daily.   Yes Historical Provider, MD  estradiol (VIVELLE-DOT) 0.0375 MG/24HR Place 1 patch onto the skin 3 (three) times a week.   Yes Historical Provider, MD  Multiple Vitamin (  MULTIVITAMIN WITH MINERALS) TABS Take 1 tablet by mouth daily.   Yes Historical Provider, MD  nepafenac (NEVANAC) 0.1 % ophthalmic suspension Place 3 drops into the right eye 3 (three) times daily.    Yes Historical Provider, MD  rivastigmine (EXELON) 9.5 mg/24hr Place 1 patch onto the skin daily.   Yes Historical Provider, MD  valsartan (DIOVAN) 40 MG tablet Take 40 mg by mouth daily.   Yes Historical Provider, MD  warfarin (COUMADIN) 6 MG tablet Take 6 mg by mouth daily.   Yes Historical Provider, MD   Physical Exam: Filed Vitals:   08/01/12 1004 08/01/12 1012 08/01/12 1123 08/01/12 1404  BP: 102/50  115/87 127/53  Pulse: 74  82 75  Temp:  98.3 F (36.8 C)    TempSrc:  Rectal    Resp: 18  18 18   SpO2: 98%  95% 95%   General appearance: alert, cooperative and no distress  Head: Normocephalic, without obvious abnormality, atraumatic  Eyes: conjunctivae/corneas clear. PERRL, EOM's intact. Fundi benign.  Nose: Nares normal. Septum midline. Mucosa normal. No drainage or sinus tenderness.  Throat: lips, mucosa, and tongue normal; teeth and gums normal  Neck: Supple, no masses, no cervical lymphadenopathy, no JVD appreciated, no meningeal signs Resp: clear to auscultation bilaterally  Chest wall: no tenderness  Cardio: regular rate and rhythm, S1, S2 normal, no murmur, click, rub or gallop  GI: soft, non-tender; bowel sounds normal; no masses, no organomegaly  Extremities: extremities normal, atraumatic, no cyanosis or edema  Skin: Skin color, texture, turgor normal. No rashes or lesions  Neurologic: Alert and oriented X 3, normal strength and tone. Normal symmetric reflexes. Normal coordination gait was not tested.  Labs on Admission:  Basic Metabolic Panel:  Lab 08/01/12 4540  NA 137  K 3.7  CL 102  CO2 24  GLUCOSE 126*  BUN 14  CREATININE 0.74  CALCIUM 8.9  MG --  PHOS --   Liver Function Tests: No results found for this basename: AST:5,ALT:5,ALKPHOS:5,BILITOT:5,PROT:5,ALBUMIN:5 in the last 168 hours No results found for this basename: LIPASE:5,AMYLASE:5 in the last 168 hours No results found for this basename: AMMONIA:5 in the last 168 hours CBC:  Lab 08/01/12  0737  WBC 11.0*  NEUTROABS --  HGB 11.5*  HCT 34.5*  MCV 90.1  PLT 233   Cardiac Enzymes:  Lab 08/01/12 1326  CKTOTAL --  CKMB --  CKMBINDEX --  TROPONINI <0.30    BNP (last 3 results) No results found for this basename: PROBNP:3 in the last 8760 hours CBG: No results found for this basename: GLUCAP:5 in the last 168 hours  Radiological Exams on Admission: Dg Hip Complete Right  08/01/2012  *RADIOLOGY REPORT*  Clinical Data: Fall.  Right hip pain.  Hip replacement.  RIGHT HIP - COMPLETE 2+ VIEW  Comparison: 10/22/2009  Findings: Bilateral hip replacement noted with screw fixation of the acetabular shell.  No fracture, dislocation, or complicating feature observed. Spurring and reduced intervertebral disc space noted at the L4-5.  IMPRESSION:  1.  No fracture or significant complicating feature related to the right hip implant is observed.   Original Report Authenticated By: Gaylyn Rong, M.D.     EKG: Independently reviewed.   Assessment/Plan Principal Problem:  *Syncope Active Problems:  S/P bilateral hip replacements  HTN (hypertension)  Fall  Dementia   Syncope -Patient's symptoms is consistent with syncope, likely secondary to orthostatic hypotension. -Patient fell just after she stood, no evidence of fractures or other trauma. -Patient is on  multiple blood pressure medications. I held the Ebony. -I will rule out ACS, check 3 sets of cardiac enzymes, repeat EKG in a.m. Check orthostatic blood pressure. -Hydrate with IV fluids.  Status post bilateral hip replacement -With right hip pain, pain is improved, as mentioned above x-rays did not show fractures. -Control pain with Tylenol and narcotics, likely to develop sundowning during night. -PT to evaluate and treat  Hypertension -Patient is on Avapro, bisoprolol and amlodipine. -I will continue amlodipine and bisoprolol because of rebound tachycardia.  Dementia -As mentioned above she is likely to  develop sundowning, especially with pain medications use.  Abnormal EKG -EKG showed sinus rhythm with first-degree AV block and left bundle branch block. -No previous EKGs to compare, she denies any chest pain, negative cardiac enzymes so far. -Patient has 2/6 systolic murmur, check 2-D echocardiogram.  Code Status: Full code  Family Communication: Discussed with son and husband at bedside.  Disposition Plan: Observation  Time spent: 70 minutes  [facility] Pager [phone]  If 7PM-7AM, please contact night-coverage [email or web address] Password [facility] 08/01/2012, 2:47 PM

## 2012-08-01 NOTE — Progress Notes (Signed)
PHARMACIST - PHYSICIAN COMMUNICATION  CONCERNING: Pharmacy Care Issues Regarding Warfarin Labs  RECOMMENDATION (Action Taken): A baseline and daily protime for three days has been ordered to meet the [facility] Patient safety goal and comply with the current [facility] Therapeutics Committee policy.   The Pharmacy will defer all warfarin dose order changes and follow up of lab results to the prescriber unless an additional order to initiate a "pharmacy Coumadin consult" is placed.  DESCRIPTION:  While hospitalized, to be in compliance with [employer] National Patient Safety Goals, all patients on warfarin must have a baseline and/or current protime prior to the administration of warfarin. Pharmacy has received your order for warfarin without these required laboratory assessments.  Harland German, Pharm D 08/01/2012 6:13 PM

## 2012-08-01 NOTE — ED Notes (Addendum)
Pt attempted to ambulate with assistance. Pt noted to be very weak and unable to walk long distance without feeling like she is going to fall. Pt assisted back to bed. EDP aware.

## 2012-08-01 NOTE — ED Notes (Signed)
Family at bedside frustrated. Delay in care explained to patient and family member. EDP notified. Pt eating snack at the time. Will attempt to ambulate again.

## 2012-08-01 NOTE — ED Notes (Signed)
Pt states she feels very dizzy. Hypotensive on cardiac monitor. NS fluid bolus started per EDP. Will continue to monitor.

## 2012-08-01 NOTE — ED Notes (Signed)
Pt transferred to [id number] on telemetry by [facility]

## 2012-08-02 ENCOUNTER — Inpatient Hospital Stay (HOSPITAL_COMMUNITY): Payer: Medicare Other

## 2012-08-02 DIAGNOSIS — I10 Essential (primary) hypertension: Secondary | ICD-10-CM | POA: Diagnosis not present

## 2012-08-02 DIAGNOSIS — F411 Generalized anxiety disorder: Secondary | ICD-10-CM | POA: Diagnosis not present

## 2012-08-02 DIAGNOSIS — M79609 Pain in unspecified limb: Secondary | ICD-10-CM | POA: Diagnosis not present

## 2012-08-02 DIAGNOSIS — M6281 Muscle weakness (generalized): Secondary | ICD-10-CM | POA: Diagnosis not present

## 2012-08-02 DIAGNOSIS — S7000XA Contusion of unspecified hip, initial encounter: Secondary | ICD-10-CM | POA: Diagnosis not present

## 2012-08-02 DIAGNOSIS — R269 Unspecified abnormalities of gait and mobility: Secondary | ICD-10-CM | POA: Diagnosis not present

## 2012-08-02 DIAGNOSIS — I951 Orthostatic hypotension: Secondary | ICD-10-CM | POA: Diagnosis present

## 2012-08-02 DIAGNOSIS — Y92009 Unspecified place in unspecified non-institutional (private) residence as the place of occurrence of the external cause: Secondary | ICD-10-CM | POA: Diagnosis not present

## 2012-08-02 DIAGNOSIS — Z9181 History of falling: Secondary | ICD-10-CM | POA: Diagnosis not present

## 2012-08-02 DIAGNOSIS — Z96649 Presence of unspecified artificial hip joint: Secondary | ICD-10-CM | POA: Diagnosis not present

## 2012-08-02 DIAGNOSIS — H269 Unspecified cataract: Secondary | ICD-10-CM | POA: Diagnosis not present

## 2012-08-02 DIAGNOSIS — F0280 Dementia in other diseases classified elsewhere without behavioral disturbance: Secondary | ICD-10-CM | POA: Diagnosis not present

## 2012-08-02 DIAGNOSIS — H259 Unspecified age-related cataract: Secondary | ICD-10-CM | POA: Diagnosis not present

## 2012-08-02 DIAGNOSIS — S7010XA Contusion of unspecified thigh, initial encounter: Secondary | ICD-10-CM | POA: Diagnosis present

## 2012-08-02 DIAGNOSIS — I119 Hypertensive heart disease without heart failure: Secondary | ICD-10-CM | POA: Diagnosis not present

## 2012-08-02 DIAGNOSIS — I4891 Unspecified atrial fibrillation: Secondary | ICD-10-CM | POA: Diagnosis present

## 2012-08-02 DIAGNOSIS — I059 Rheumatic mitral valve disease, unspecified: Secondary | ICD-10-CM | POA: Diagnosis present

## 2012-08-02 DIAGNOSIS — F0390 Unspecified dementia without behavioral disturbance: Secondary | ICD-10-CM | POA: Diagnosis not present

## 2012-08-02 DIAGNOSIS — R55 Syncope and collapse: Secondary | ICD-10-CM | POA: Diagnosis not present

## 2012-08-02 DIAGNOSIS — F40298 Other specified phobia: Secondary | ICD-10-CM | POA: Diagnosis present

## 2012-08-02 LAB — TROPONIN I: Troponin I: <0.3 ng/mL (ref <0.30)

## 2012-08-02 LAB — PROTIME-INR
INR: 2.68 — ABNORMAL HIGH (ref 0.00–1.49)
Prothrombin Time: 27.2 seconds — ABNORMAL HIGH (ref 11.6–15.2)

## 2012-08-02 LAB — TSH: TSH: 4.341 u[IU]/mL (ref 0.350–4.500)

## 2012-08-02 MED ORDER — OXYCODONE-ACETAMINOPHEN 5-325 MG PO TABS
1.0000 | ORAL_TABLET | Freq: Four times a day (QID) | ORAL | Status: DC | PRN
  Administered 2012-08-03 – 2012-08-04 (×5): 2 via ORAL
  Filled 2012-08-02 (×5): qty 2

## 2012-08-02 NOTE — Progress Notes (Signed)
ANTICOAGULATION CONSULT NOTE - Follow Up Consult  Pharmacy Consult for Coumadin Indication: atrial fibrillation  Allergies  Allergen Reactions  . Dilaudid (Hydromorphone Hcl) Other (See Comments)    hallucinations  . Vicodin (Hydrocodone-Acetaminophen) Other (See Comments)    hallucinations    Patient Measurements: Height: 5\' 7"  (170.2 cm) Weight: 140 lb 14 oz (63.9 kg) IBW/kg (Calculated) : 61.6  Heparin Dosing Weight:   Vital Signs: Temp: 97.8 F (36.6 C) (01/26 0633) Temp src: Oral (01/26 0633) BP: 113/70 mmHg (01/26 0640) Pulse Rate: 77  (01/26 0640)  Labs:  Basename 08/02/12 0800 08/01/12 2329 08/01/12 1645 08/01/12 0737  HGB -- -- -- 11.5*  HCT -- -- -- 34.5*  PLT -- -- -- 233  APTT -- -- -- --  LABPROT 27.2* -- -- 25.2*  INR 2.68* -- -- 2.42*  HEPARINUNFRC -- -- -- --  CREATININE -- -- -- 0.74  CKTOTAL -- -- -- --  CKMB -- -- -- --  TROPONINI <0.30 <0.30 <0.30 --    Estimated Creatinine Clearance: 56.4 ml/min (by C-G formula based on Cr of 0.74).   Medications:  Scheduled:    . Besifloxacin HCl  1 drop Ophthalmic TID  . bisoprolol  20 mg Oral Daily  . calcium-vitamin D  1 tablet Oral BID  . Difluprednate  1 drop Ophthalmic BID  . dofetilide  250 mcg Oral BID  . estradiol  0.05 mg Transdermal Weekly  . multivitamin with minerals  1 tablet Oral Daily  . nepafenac  1 drop Right Eye TID  . rivastigmine  9.5 mg Transdermal Daily  . sodium chloride  3 mL Intravenous Q12H  . warfarin  6 mg Oral q1800  . Warfarin - Physician Dosing Inpatient   Does not apply q1800  . [DISCONTINUED] amLODipine  2.5 mg Oral Daily  . [DISCONTINUED] Besifloxacin HCl  1 drop Right Eye TID  . [DISCONTINUED] calcium-vitamin D  2 tablet Oral BID  . [DISCONTINUED] Difluprednate  1 drop Right Eye BID  . [DISCONTINUED] gatifloxacin  1 drop Right Eye QID  . [DISCONTINUED] nepafenac  1 drop Right Eye TID    Assessment: 79 yr old female admitted after a fall at home.  Fall  thought to be syncope secondary to orthostatic hypotension. Pt was on amlodipine, irbesartan and bisoprolol. Amlodipine and irbesartan have been held. MD has been dosing coumadin but changed to pharmacy dosing today. Home dose is 6 mg daily. INR is 2.68 today.  Goal of Therapy:  INR 2-3    Plan:  Will continue the coumadin 6 mg ordered for today. Daily PT/INR  Eugene Garnet 08/02/2012,2:13 PM

## 2012-08-02 NOTE — Progress Notes (Signed)
[facility] PROGRESS NOTE  [name] ZOX:[id number] DOB: 11/20/1932 DOA: 08/01/2012 PCP: Ginette Otto, MD  HPI/Subjective: Son is at bedside, has moderate right hip pain.    Assessment/Plan:  Syncope  -Patient's symptoms is consistent with syncope, likely secondary to orthostatic hypotension.  -Patient fell just after she stood, no evidence of fractures or other trauma.  -Patient is on multiple blood pressure medications. I held the Avapro and amlodipine -3 sets of cardiac enzymes. -Vital signs showed orthostatic hypotension. Hydrate with IV fluids.   Status post bilateral hip replacement  -With right hip pain, pain is improved, as mentioned above x-rays did not show fractures.  -Control pain with Tylenol and narcotics, likely to develop sundowning during night.  -PT to evaluate and treat, she still complaining about pain obtain CT scan to rule out occult fractures.  Hypertension  -Patient is on Avapro, bisoprolol and amlodipine.  -I will continue bisoprolol because of rebound tachycardia.   Dementia  -As mentioned above she is likely to develop sundowning, especially with pain medications use.   Atrial fibrillation -History of atrial fibrillation, currently sinus rhythm, she is on Tikosyn and bisoprolol, she is on Coumadin. -No recurrent falls, we'll continue Coumadin. Pharmacy to adjust the dose.  Abnormal EKG  -EKG showed sinus rhythm with first-degree AV block and left bundle branch block.  -No previous EKGs to compare, she denies any chest pain, negative cardiac enzymes so far.  -Patient has 2/6 systolic murmur, check 2-D echocardiogram.   Code Status: Full code  Family Communication: Discussed with son and husband at bedside.  Disposition Plan: Observation   Consultants:  None  Procedures:  None  Antibiotics:  None  Objective: Filed Vitals:   08/01/12 2036 08/02/12 0633 08/02/12 0637 08/02/12 0640  BP: 156/75 129/73 117/69 113/70   Pulse: 81 79 72 77  Temp: 98 F (36.7 C) 97.8 F (36.6 C)    TempSrc: Oral Oral    Resp: 18 18    Height:      Weight:      SpO2: 95% 99%      Intake/Output Summary (Last 24 hours) at 08/02/12 1326 Last data filed at 08/02/12 0900  Gross per 24 hour  Intake    360 ml  Output      0 ml  Net    360 ml   Filed Weights   08/01/12 1556  Weight: 63.9 kg (140 lb 14 oz)    Exam:  General: Alert and awake, oriented x3, not in any acute distress. HEENT: anicteric sclera, pupils reactive to light and accommodation, EOMI CVS: S1-S2 clear, no murmur rubs or gallops Chest: clear to auscultation bilaterally, no wheezing, rales or rhonchi Abdomen: soft nontender, nondistended, normal bowel sounds, no organomegaly Extremities: no cyanosis, clubbing or edema noted bilaterally Neuro: Cranial nerves II-XII intact, no focal neurological deficits  Data Reviewed: Basic Metabolic Panel:  Lab 08/01/12 8119  NA 137  K 3.7  CL 102  CO2 24  GLUCOSE 126*  BUN 14  CREATININE 0.74  CALCIUM 8.9  MG --  PHOS --   Liver Function Tests: No results found for this basename: AST:5,ALT:5,ALKPHOS:5,BILITOT:5,PROT:5,ALBUMIN:5 in the last 168 hours No results found for this basename: LIPASE:5,AMYLASE:5 in the last 168 hours No results found for this basename: AMMONIA:5 in the last 168 hours CBC:  Lab 08/01/12 0737  WBC 11.0*  NEUTROABS --  HGB 11.5*  HCT 34.5*  MCV 90.1  PLT 233   Cardiac Enzymes:  Lab 08/02/12 0800 08/01/12  2329 08/01/12 1645 08/01/12 1326  CKTOTAL -- -- -- --  CKMB -- -- -- --  CKMBINDEX -- -- -- --  TROPONINI <0.30 <0.30 <0.30 <0.30   BNP (last 3 results) No results found for this basename: PROBNP:3 in the last 8760 hours CBG: No results found for this basename: GLUCAP:5 in the last 168 hours  No results found for this or any previous visit (from the past 240 hour(s)).   Studies: Dg Hip Complete Right  08/01/2012  *RADIOLOGY REPORT*  Clinical Data: Fall.   Right hip pain.  Hip replacement.  RIGHT HIP - COMPLETE 2+ VIEW  Comparison: 10/22/2009  Findings: Bilateral hip replacement noted with screw fixation of the acetabular shell.  No fracture, dislocation, or complicating feature observed. Spurring and reduced intervertebral disc space noted at the L4-5.  IMPRESSION:  1.  No fracture or significant complicating feature related to the right hip implant is observed.   Original Report Authenticated By: Gaylyn Rong, M.D.     Scheduled Meds:   . Besifloxacin HCl  1 drop Ophthalmic TID  . bisoprolol  20 mg Oral Daily  . calcium-vitamin D  1 tablet Oral BID  . Difluprednate  1 drop Ophthalmic BID  . dofetilide  250 mcg Oral BID  . estradiol  0.05 mg Transdermal Weekly  . multivitamin with minerals  1 tablet Oral Daily  . nepafenac  1 drop Right Eye TID  . rivastigmine  9.5 mg Transdermal Daily  . sodium chloride  3 mL Intravenous Q12H  . warfarin  6 mg Oral q1800  . Warfarin - Physician Dosing Inpatient   Does not apply q1800   Continuous Infusions:   . sodium chloride 100 mL/hr at 08/01/12 1500    Principal Problem:  *Syncope Active Problems:  S/P bilateral hip replacements  HTN (hypertension)  Fall  Dementia    Time spent: 35 minutes    [facility]  [facility] Pager [phone]. If 8PM-8AM, please contact night-coverage at [email or web address], password [facility] 08/02/2012, 1:26 PM  LOS: 1 day

## 2012-08-02 NOTE — Evaluation (Signed)
Physical Therapy Evaluation Patient Details Name: [name] MRN: [id number] DOB: 05-13-1933 Today's Date: 08/02/2012 Time: 4010-2725 PT Time Calculation (min): 49 min  PT Assessment / Plan / Recommendation Clinical Impression  Patient is a 79 yo female admitted with syncope/fall with right hip pain.  Patient also with h/o dementia.  Patient with weakness and pain, impacting functional mobility.  Cognition also impacting safety.  Will benefit from acute PT to maximize independence prior to discharge.  Recommend ST-SNF for continued therapy at discharge.    PT Assessment  Patient needs continued PT services    Follow Up Recommendations  SNF    Does the patient have the potential to tolerate intense rehabilitation      Barriers to Discharge Decreased caregiver support      Equipment Recommendations  None recommended by PT    Recommendations for Other Services     Frequency Min 3X/week    Precautions / Restrictions Precautions Precautions: Fall Restrictions Weight Bearing Restrictions: No   Pertinent Vitals/Pain Pain impacting mobility      Mobility  Bed Mobility Bed Mobility: Supine to Sit;Sitting - Scoot to Edge of Bed;Sit to Supine Supine to Sit: 4: Min assist;HOB flat Sitting - Scoot to Edge of Bed: 4: Min assist Sit to Supine: 4: Min assist;HOB flat Details for Bed Mobility Assistance: Verbal cues for technique.  Assist needed to move RLE.  In sitting, patient with difficulty sitting on right hip.  Needed step-by-step instructions to scoot to EOB and try to evenly weight bear on bil. hips. Transfers Transfers: Sit to Stand;Stand to [employer] Transfers Sit to Stand: 4: Min assist;With upper extremity assist;From bed;With armrests;From chair/3-in-1 Stand to Sit: 4: Min assist;With upper extremity assist;With armrests;To chair/3-in-1;To bed Stand Pivot Transfers: 4: Min assist Details for Transfer Assistance: Verbal cues for hand placement and technique.   Patient with increased pain in RLE in standing.  Maintained NWB RLE - unable to put weight on RLE due to pain.  Able to transfer bed <> BSC with min assist and max cueing for safety/technique. Ambulation/Gait Ambulation/Gait Assistance: Not tested (comment)           PT Diagnosis: Difficulty walking;Generalized weakness;Acute pain;Altered mental status  PT Problem List: Decreased strength;Decreased range of motion;Decreased activity tolerance;Decreased balance;Decreased mobility;Decreased cognition;Decreased knowledge of use of DME;Decreased safety awareness;Pain PT Treatment Interventions: DME instruction;Gait training;Functional mobility training;Therapeutic exercise;Patient/family education;Cognitive remediation   PT Goals Acute Rehab PT Goals PT Goal Formulation: With patient Time For Goal Achievement: 08/16/12 Potential to Achieve Goals: Good Pt will go Supine/Side to Sit: Independently;with HOB 0 degrees PT Goal: Supine/Side to Sit - Progress: Goal set today Pt will go Sit to Supine/Side: Independently;with HOB 0 degrees PT Goal: Sit to Supine/Side - Progress: Goal set today Pt will go Sit to Stand: with modified independence;with upper extremity assist PT Goal: Sit to Stand - Progress: Goal set today Pt will Ambulate: 51 - 150 feet;with modified independence;with rolling walker PT Goal: Ambulate - Progress: Goal set today  Visit Information  Last PT Received On: 08/02/12 Assistance Needed: +1    Subjective Data  Subjective: Very talkative.  Rambling conversation Patient Stated Goal: To be able to return home.   Prior Functioning  Home Living Lives With: Alone Available Help at Discharge: Friend(s);Available PRN/intermittently Type of Home: House Home Access: Stairs to enter [employer] of Steps: 5 Entrance Stairs-Rails: Right;Left Home Layout: One level Home Adaptive Equipment: Walker - rolling;Straight cane Prior Function Level of Independence:  Independent;Needs assistance Needs  Assistance: Meal Prep;Light Housekeeping Meal Prep: Moderate Light Housekeeping: Maximal Able to Take Stairs?: Yes Driving: Yes Vocation: Retired Comments: Patient reports she walks at night in her sleep moving objects from one place to another. Communication Communication: No difficulties    Cognition  Overall Cognitive Status: Impaired Area of Impairment: Attention;Memory;Awareness of deficits;Problem solving;Following commands;Awareness of errors Arousal/Alertness: Awake/alert Orientation Level: Disoriented to;Time Behavior During Session: Restless Current Attention Level: Sustained Attention - Other Comments: Needs redirection to task/question.  Fixation on memory loss. Memory Deficits: Decreased short term memory.  Asked PT's name 3 times within 5 minutes. Following Commands: Follows one step commands with increased time (Increased time to process request) Awareness of Errors: Assistance required to identify errors made;Assistance required to correct errors made Awareness of Deficits: When asked why she is in hospital, she reports "memory loss" rather than fall with rt. hip injury.  This was just after we talked about the CT that she just returned from for her hip. Cognition - Other Comments: Patient's conversations are rambling.  She starts to answer a question, and then goes off topic, needing redirection to finish answering question.    Extremity/Trunk Assessment Right Upper Extremity Assessment RUE ROM/Strength/Tone: WFL for tasks assessed RUE Sensation: WFL - Light Touch Left Upper Extremity Assessment LUE ROM/Strength/Tone: WFL for tasks assessed LUE Sensation: WFL - Light Touch Right Lower Extremity Assessment RLE ROM/Strength/Tone: Deficits;Unable to fully assess;Due to pain RLE ROM/Strength/Tone Deficits: Able to move LE against gravity.  Did not MMT due to pain. RLE Sensation: WFL - Light Touch Left Lower Extremity Assessment LLE  ROM/Strength/Tone: WFL for tasks assessed LLE Sensation: WFL - Light Touch   Balance Balance Balance Assessed: Yes Static Sitting Balance Static Sitting - Balance Support: Bilateral upper extremity supported;Feet unsupported Static Sitting - Level of Assistance: 4: Min assist Static Sitting - Comment/# of Minutes: Cues to put RLE on floor - patient kept it crossed over LLE to decrease pain.  Sat x 8 minutes with assist for balance due to posterior and left lean.  End of Session PT - End of Session Equipment Utilized During Treatment: Gait belt Activity Tolerance: Patient limited by pain Patient left: in bed;with call bell/phone within reach;with family/visitor present Nurse Communication: Mobility status  GP     Vena Austria 08/02/2012, 6:42 PM Durenda Hurt. Renaldo Fiddler, [facility] Acute Rehab Services Pager [phone]

## 2012-08-02 NOTE — Care Management Utilization Note (Signed)
UR completed 

## 2012-08-02 NOTE — Progress Notes (Signed)
  Echocardiogram 2D Echocardiogram has been performed.  [name] 08/02/2012, 1:01 PM 

## 2012-08-03 DIAGNOSIS — I119 Hypertensive heart disease without heart failure: Secondary | ICD-10-CM

## 2012-08-03 DIAGNOSIS — Y92009 Unspecified place in unspecified non-institutional (private) residence as the place of occurrence of the external cause: Secondary | ICD-10-CM | POA: Diagnosis not present

## 2012-08-03 DIAGNOSIS — R55 Syncope and collapse: Secondary | ICD-10-CM | POA: Diagnosis not present

## 2012-08-03 DIAGNOSIS — I340 Nonrheumatic mitral (valve) insufficiency: Secondary | ICD-10-CM

## 2012-08-03 DIAGNOSIS — S8010XA Contusion of unspecified lower leg, initial encounter: Secondary | ICD-10-CM | POA: Diagnosis present

## 2012-08-03 DIAGNOSIS — I4891 Unspecified atrial fibrillation: Secondary | ICD-10-CM

## 2012-08-03 LAB — PROTIME-INR
INR: 2.1 — ABNORMAL HIGH (ref 0.00–1.49)
Prothrombin Time: 22.7 seconds — ABNORMAL HIGH (ref 11.6–15.2)

## 2012-08-03 NOTE — Progress Notes (Signed)
[facility] PROGRESS NOTE  [name] RUE:[id number] DOB: 1932-11-20 DOA: 08/01/2012 PCP: Ginette Otto, MD  HPI/Subjective: Complaining of claustrophobia.  Needs door left open.  Asking if there are windows on the other side of the hall way.      Assessment/Plan:  Syncope  syncope, likely secondary to orthostatic hypotension.  Still mildly orthostatic this am. Patient fell just after she stood, no evidence of fractures.  Large hematoma on left thigh. Blood pressure medications reduced.  Avapro and amlodipine discontinued. 3 sets of cardiac enzymes are negative. Hydrate with IV fluids.   Left thigh hematoma From fall From knee to groin medially and posteriorly Coumadin discontinued (have not reversed INR with [facility])  Status post bilateral hip replacement  With right hip pain, pain is improved, CT scan &  x-rays did not show fractures.  Control pain with Tylenol and narcotics, likely to develop sundowning during night.  PT recommends SNF.  Hypertension  continue bisoprolol because of rebound tachycardia.  Other BP meds discontinued.  Dementia  As mentioned above she is likely to develop sundowning, especially with pain medications use.   Claustrophobia Per son, worse in hospital environment On Xanax tid prn.  Atrial fibrillation History of atrial fibrillation, currently sinus rhythm, she is on Tikosyn and bisoprolol Coumadin discontinue.  Son has notified Dr. Donnie Aho, Cardiologist.  Abnormal EKG  EKG showed sinus rhythm with first-degree AV block and left bundle branch block.  No previous EKGs to compare, she denies any chest pain, negative cardiac enzymes so far.  Patient has 2/6 systolic murmur, check 2-D echocardiogram. (still pending?)  Code Status: Full code  Family Communication: Discussed with son and husband at bedside.  Disposition Plan: to SNF likely  08/04/12   Consultants:  None  Procedures:  None  Antibiotics:  None  Objective: Filed Vitals:   08/03/12 0625 08/03/12 0627 08/03/12 0631 08/03/12 1538  BP: 137/70 123/64 116/58 119/81  Pulse: 66 78 75 64  Temp: 98.3 F (36.8 C)   97.7 F (36.5 C)  TempSrc: Oral   Oral  Resp: 18   20  Height:      Weight:      SpO2: 97%   99%    Intake/Output Summary (Last 24 hours) at 08/03/12 1542 Last data filed at 08/02/12 1600  Gross per 24 hour  Intake 2351.67 ml  Output      0 ml  Net 2351.67 ml   Filed Weights   08/01/12 1556  Weight: 63.9 kg (140 lb 14 oz)    Exam:  General: Alert and awake, oriented x3, not in any acute distress. HEENT: anicteric sclera, pupils reactive to light and accommodation, EOMI CVS: S1-S2 clear, 2/6 murmur, no rubs or gallops Chest: clear to auscultation bilaterally, no wheezing, rales or rhonchi Abdomen: soft nontender, nondistended, normal bowel sounds, no organomegaly Extremities:Large red/purple hematoma on left medial thigh from groin to above knee. Neuro: Cranial nerves II-XII intact, no focal neurological deficits  Data Reviewed: Basic Metabolic Panel:  Lab 08/01/12 1478  NA 137  K 3.7  CL 102  CO2 24  GLUCOSE 126*  BUN 14  CREATININE 0.74  CALCIUM 8.9  MG --  PHOS --   CBC:  Lab 08/01/12 0737  WBC 11.0*  NEUTROABS --  HGB 11.5*  HCT 34.5*  MCV 90.1  PLT 233   Cardiac Enzymes:  Lab 08/02/12 0800 08/01/12 2329 08/01/12 1645 08/01/12 1326  CKTOTAL -- -- -- --  CKMB -- -- -- --  CKMBINDEX -- -- -- --  TROPONINI <0.30 <0.30 <0.30 <0.30    Studies: Ct Hip Right Wo Contrast  08/02/2012  *RADIOLOGY REPORT*  Clinical Data: Fall.  Right hip pain.  Right thigh hematoma.  CT OF THE RIGHT HIP WITHOUT CONTRAST  Technique:  Multidetector CT imaging was performed according to the standard protocol. Multiplanar CT image reconstructions were also generated.  Comparison: Multiple exams, including 08/01/2012 radiographs and  CT scan from 10/22/2009  Findings:  Extensive hematoma is present in the right hip adductor musculature and tracking caudad.  Surrounding edema noted.  No definite fracture is identified. There is considerable artifact around the implant on the O-MAR images, including irregularity of the anterior column of the acetabulum which is not readily apparent on the non O_MAR images and accordingly is thought to be artifact.  IMPRESSION:  1.  Considerable hematoma throughout theright hip adductor musculature, with surrounding stranding.  No definite fracture.  No dislocation.   Original Report Authenticated By: Gaylyn Rong, M.D.     Scheduled Meds:    . Besifloxacin HCl  1 drop Ophthalmic TID  . bisoprolol  20 mg Oral Daily  . calcium-vitamin D  1 tablet Oral BID  . Difluprednate  1 drop Ophthalmic BID  . dofetilide  250 mcg Oral BID  . estradiol  0.05 mg Transdermal Weekly  . multivitamin with minerals  1 tablet Oral Daily  . nepafenac  1 drop Right Eye TID  . rivastigmine  9.5 mg Transdermal Daily  . sodium chloride  3 mL Intravenous Q12H   Continuous Infusions:    . sodium chloride 50 mL/hr at 08/03/12 1017    Principal Problem:  *Syncope Active Problems:  S/P bilateral hip replacements  HTN (hypertension)  Fall  Dementia    Time spent: 35 minutes    Algis Downs L,PA-C [facility] Pager [phone]. If 8PM-8AM, please contact night-coverage at [email or web address], password [facility] 08/03/2012, 3:42 PM  LOS: 2 days

## 2012-08-03 NOTE — Progress Notes (Signed)
Addendum  Patient seen and examined, chart and data base reviewed.  I agree with the above assessment and plan.  For full details please see Mrs. Algis Downs PA. Note.  Large  Right hip hematoma, likely 2/2 to the fall, will off of the  Coumadin for now.   Clint Lipps, MD [facility] Pager: [phone] 08/03/2012, 4:40 PM

## 2012-08-03 NOTE — Progress Notes (Signed)
Physical Therapy Treatment Patient Details Name: [name] MRN: [id number] DOB: 12-24-1932 Today's Date: 08/03/2012 Time: 4098-1191 PT Time Calculation (min): 27 min  PT Assessment / Plan / Recommendation Comments on Treatment Session  Pt adm with syncope, fall, and rt hip hematoma. Pt making steady progress.    Follow Up Recommendations  SNF     Does the patient have the potential to tolerate intense rehabilitation     Barriers to Discharge        Equipment Recommendations  None recommended by PT    Recommendations for Other Services    Frequency Min 3X/week   Plan Discharge plan remains appropriate;Frequency remains appropriate    Precautions / Restrictions Precautions Precautions: Fall   Pertinent Vitals/Pain Grimacing with wt bearing on rt leg/hip.    Mobility  Bed Mobility Supine to Sit: 4: Min guard;HOB flat Sitting - Scoot to Edge of Bed: 4: Min guard Details for Bed Mobility Assistance: Pt with diffculty putting wt on rt hip in sitting EOB. Transfers Sit to Stand: 4: Min assist;With upper extremity assist;From bed Stand to Sit: 4: Min assist;With upper extremity assist;To chair/3-in-1;With armrests Details for Transfer Assistance: verbal cues for hand placement.  Pt tolerating only minimal wt bearing on RLE. Ambulation/Gait Ambulation/Gait Assistance: 4: Min assist Ambulation Distance (Feet): 4 Feet Assistive device: Rolling walker Ambulation/Gait Assistance Details: Verbal cues for gait sequence.  Pt putting only minimal wt on RLE. Gait Pattern: Step-to pattern;Decreased stance time - right    Exercises General Exercises - Lower Extremity Ankle Circles/Pumps: Strengthening;Both;10 reps;Seated Quad Sets: Strengthening;Both;10 reps;Seated   PT Diagnosis:    PT Problem List:   PT Treatment Interventions:     PT Goals Acute Rehab PT Goals PT Goal: Supine/Side to Sit - Progress: Progressing toward goal PT Goal: Sit to Stand - Progress: Progressing  toward goal PT Goal: Ambulate - Progress: Progressing toward goal  Visit Information  Last PT Received On: 08/03/12 Assistance Needed: +1    Subjective Data  Subjective: "Is this still [facility]?" pt asked.   Cognition  Overall Cognitive Status: History of cognitive impairments - at baseline Arousal/Alertness: Awake/alert Orientation Level: Disoriented to;Time;Situation Behavior During Session: [facility] for tasks performed Current Attention Level: Sustained    Balance     End of Session PT - End of Session Equipment Utilized During Treatment: Gait belt Activity Tolerance: Patient limited by pain Patient left: in chair;with call bell/phone within reach;with family/visitor present Nurse Communication: Mobility status   GP     Rayson Rando 08/03/2012, 3:52 PM  [employer] PT [phone]

## 2012-08-03 NOTE — Clinical Social Work Placement (Addendum)
Clinical Social Work Department CLINICAL SOCIAL WORK PLACEMENT NOTE 08/03/2012  Patient:  REMA, LIEVANOS  Account Number:  [id number] Admit date:  08/01/2012  Clinical Social Worker:  Johnsie Cancel  Date/time:  08/03/2012 04:20 PM  Clinical Social Work is seeking post-discharge placement for this patient at the following level of care:   SKILLED NURSING   (*CSW will update this form in Epic as items are completed)   08/03/2012  Patient/family provided with [facility] Department of Clinical Social Work's list of facilities offering this level of care within the geographic area requested by the patient (or if unable, by the patient's family).  08/03/2012  Patient/family informed of their freedom to choose among providers that offer the needed level of care, that participate in Medicare, Medicaid or managed care program needed by the patient, have an available bed and are willing to accept the patient.  08/03/2012  Patient/family informed of [facility]' ownership interest in [facility], as well as of the fact that they are under no obligation to receive care at this facility.  PASARR submitted to EDS on 08/03/2012 PASARR number received from EDS on   FL2 transmitted to all facilities in geographic area requested by pt/family on  08/03/2012 FL2 transmitted to all facilities within larger geographic area on   Patient informed that his/her managed care company has contracts with or will negotiate with  certain facilities, including the following:     Patient/family informed of bed offers received:  08/04/2012 Patient chooses bed at [facility] Physician recommends and patient chooses bed at    Patient to be transferred to  on  [facility] on 08/04/2012 Patient to be transferred to facility by [facility]  The following physician request were entered in Epic:   Additional Comments:  Lia Foyer, LCSWA [facility] Clinical Social  Worker Contact #: [phone]

## 2012-08-03 NOTE — Progress Notes (Signed)
Subjective:  Family called stating that the patient had fallen. She has a prior history of atrial fibrillation and has been maintained on Tikosyn in sinus rhythm. She has a mild amount of dementia. She evidently fell and it was really unsure the exact mechanism of the fall. She was found to be somewhat orthostatic and her blood pressure medicines have been adjusted. She has a large hematoma on her hip and plans are to hold the warfarin until this could sputter.  Objective:  Vital Signs in the last 24 hours: BP 119/81  Pulse 64  Temp 97.7 F (36.5 C) (Oral)  Resp 20  Ht 5\' 7"  (1.702 m)  Wt 63.9 kg (140 lb 14 oz)  BMI 22.06 kg/m2  SpO2 99%  Physical Exam: Pleasant white female in no acute distress Lungs:  Clear Cardiac:  Regular rhythm, normal S1 and S2, no S3, 2/6 systolic murmur  Abdomen:  Soft, nontender, no masses Extremities:  No edema present She is oriented to year and to the day of the week but not to the month  Intake/Output from previous day: 01/26 0701 - 01/27 0700 In: 2951.7 [P.O.:600; I.V.:2351.7] Out: -  Weight Filed Weights   08/01/12 1556  Weight: 63.9 kg (140 lb 14 oz)    Lab Results: Basic Metabolic Panel:  Basename 08/01/12 0737  NA 137  K 3.7  CL 102  CO2 24  GLUCOSE 126*  BUN 14  CREATININE 0.74    CBC:  Basename 08/01/12 0737  WBC 11.0*  NEUTROABS --  HGB 11.5*  HCT 34.5*  MCV 90.1  PLT 233    PROTIME: Lab Results  Component Value Date   INR 2.10* 08/03/2012   INR 2.68* 08/02/2012   INR 2.42* 08/01/2012   Assessment/Plan:  1. Atrial fibrillation currently in normal sinus rhythm on Tikosyn 2. Hypertensive heart disease 3. Moderate mitral regurgitation  Recommendations:  I think it is reasonable to hold the warfarin for the time being. She is in normal sinus rhythm on the Tikosyn at the time. The bigger question is could be whether it is safe for her to stay at home.     Darden Palmer  MD  [facility] Cardiology  08/03/2012, 7:29 PM

## 2012-08-03 NOTE — Clinical Social Work Psychosocial (Signed)
Clinical Social Work Department BRIEF PSYCHOSOCIAL ASSESSMENT 08/03/2012  Patient:  Crystal, Franklin     Account Number:  [id number]     Admit date:  08/01/2012  Clinical Social Worker:  Johnsie Cancel  Date/Time:  08/03/2012 04:15 PM  Referred by:  Physician  Date Referred:  08/03/2012 Referred for  SNF Placement   Other Referral:   Interview type:  Patient Other interview type:   Son; John    PSYCHOSOCIAL DATA Living Status:  ALONE:   Primary support name:  Jonny Ruiz [phone] Primary support relationship to patient:  CHILD, ADULT Degree of support available:   Adequate, at patient's bedside.    CURRENT CONCERNS Current Concerns  Post-Acute Placement   Other Concerns:    SOCIAL WORK ASSESSMENT / PLAN CSW consulted re: SNF placement. CSW met with patient and son at bedside. CSW provided support on current hospitalization and education on SNF process. Patient and son agreeable to SNF. This CSW encouraged the family to look at facilities, and ask friends on facility options. CSW will fax the patient out 08/03/12, and contact the son, 1/28 with bed offers and the patient's bed choice. CSW will continue to follow patient.   Assessment/plan status:  Information/Referral to [facility] Other assessment/ plan:   Information/referral to community resources:   SNF List  Medicare.org website    PATIENT'S/FAMILY'S RESPONSE TO PLAN OF CARE: Patient's family thanked CSW for facilitating d/c process and providing support.   Lia Foyer, LCSWA [facility] Clinical Social Worker Contact #: [phone]

## 2012-08-04 DIAGNOSIS — R0609 Other forms of dyspnea: Secondary | ICD-10-CM | POA: Diagnosis not present

## 2012-08-04 DIAGNOSIS — I059 Rheumatic mitral valve disease, unspecified: Secondary | ICD-10-CM | POA: Diagnosis not present

## 2012-08-04 DIAGNOSIS — H259 Unspecified age-related cataract: Secondary | ICD-10-CM | POA: Diagnosis not present

## 2012-08-04 DIAGNOSIS — R0989 Other specified symptoms and signs involving the circulatory and respiratory systems: Secondary | ICD-10-CM | POA: Diagnosis not present

## 2012-08-04 DIAGNOSIS — Z9181 History of falling: Secondary | ICD-10-CM | POA: Diagnosis not present

## 2012-08-04 DIAGNOSIS — R488 Other symbolic dysfunctions: Secondary | ICD-10-CM | POA: Diagnosis not present

## 2012-08-04 DIAGNOSIS — I4891 Unspecified atrial fibrillation: Secondary | ICD-10-CM

## 2012-08-04 DIAGNOSIS — H269 Unspecified cataract: Secondary | ICD-10-CM | POA: Diagnosis not present

## 2012-08-04 DIAGNOSIS — M6281 Muscle weakness (generalized): Secondary | ICD-10-CM | POA: Diagnosis not present

## 2012-08-04 DIAGNOSIS — M79609 Pain in unspecified limb: Secondary | ICD-10-CM | POA: Diagnosis not present

## 2012-08-04 DIAGNOSIS — R7989 Other specified abnormal findings of blood chemistry: Secondary | ICD-10-CM | POA: Diagnosis not present

## 2012-08-04 DIAGNOSIS — R55 Syncope and collapse: Secondary | ICD-10-CM | POA: Diagnosis not present

## 2012-08-04 DIAGNOSIS — R269 Unspecified abnormalities of gait and mobility: Secondary | ICD-10-CM | POA: Diagnosis not present

## 2012-08-04 DIAGNOSIS — F0280 Dementia in other diseases classified elsewhere without behavioral disturbance: Secondary | ICD-10-CM | POA: Diagnosis not present

## 2012-08-04 DIAGNOSIS — Y92009 Unspecified place in unspecified non-institutional (private) residence as the place of occurrence of the external cause: Secondary | ICD-10-CM | POA: Diagnosis not present

## 2012-08-04 DIAGNOSIS — I10 Essential (primary) hypertension: Secondary | ICD-10-CM | POA: Diagnosis not present

## 2012-08-04 DIAGNOSIS — R41841 Cognitive communication deficit: Secondary | ICD-10-CM | POA: Diagnosis not present

## 2012-08-04 DIAGNOSIS — F0390 Unspecified dementia without behavioral disturbance: Secondary | ICD-10-CM | POA: Diagnosis not present

## 2012-08-04 DIAGNOSIS — S8010XA Contusion of unspecified lower leg, initial encounter: Secondary | ICD-10-CM

## 2012-08-04 DIAGNOSIS — I119 Hypertensive heart disease without heart failure: Secondary | ICD-10-CM | POA: Diagnosis not present

## 2012-08-04 DIAGNOSIS — F411 Generalized anxiety disorder: Secondary | ICD-10-CM | POA: Diagnosis not present

## 2012-08-04 LAB — URINALYSIS, ROUTINE W REFLEX MICROSCOPIC
Glucose, UA: NEGATIVE mg/dL
Hgb urine dipstick: NEGATIVE
Ketones, ur: NEGATIVE mg/dL
Protein, ur: NEGATIVE mg/dL

## 2012-08-04 MED ORDER — ALPRAZOLAM 0.25 MG PO TABS: 0.2500 mg | ORAL_TABLET | Freq: Three times a day (TID) | ORAL | Status: DC | PRN

## 2012-08-04 MED ORDER — ASPIRIN 81 MG PO CHEW
81.0000 mg | CHEWABLE_TABLET | Freq: Every day | ORAL | Status: DC
  Administered 2012-08-04: 81 mg via ORAL
  Filled 2012-08-04: qty 1

## 2012-08-04 MED ORDER — OXYCODONE-ACETAMINOPHEN 5-325 MG PO TABS: 1.0000 | ORAL_TABLET | Freq: Four times a day (QID) | ORAL | Status: DC | PRN

## 2012-08-04 MED ORDER — ASPIRIN 81 MG PO CHEW: 81.0000 mg | CHEWABLE_TABLET | Freq: Every day | ORAL | Status: DC

## 2012-08-04 NOTE — H&P (Deleted)
Physician Discharge Summary  SHAKOYA GILMORE ZOX:[id number] DOB: 1932/10/02 DOA: 08/01/2012  PCP: Ginette Otto, MD  Admit date: 08/01/2012 Discharge date: 08/04/2012  Time spent: 45 min.  Recommendations for Outpatient Follow-up:   She needs physical therapy and occupational therapy at the skilled nursing facility.  Patient fell and experienced a syncopal episode likely due to orthostatic hypotension.   She has been taken off of amlodipine and avapro but left on Bisoprolol.  Please monitor her blood pressure.  Consider letting it run slightly high to avoid orthostatic hypotension.  Patient was on Coumadin for atrial fibrillation.  This was discontinued due to her fall and large hematoma.  At some point - in combination with cardiology, a decision should be made about whether or not to restart it.  Patient complains of significant symptoms of claustrophobia.  Seems to do better with low dose xanax.  May benefit from further psychiatric consultation.  Discharge Diagnoses:  Principal Problem:  *Syncope Active Problems:  S/P bilateral hip replacements  HTN (hypertension)  Fall  Dementia  Hematoma of lower limb  Atrial fibrillation  Hypertensive heart disease  Mitral regurgitation   Discharge Condition: stable.   Diet recommendation: Heart Healthy  Filed Weights   08/01/12 1556  Weight: 63.9 kg (140 lb 14 oz)    History of present illness:  [name] is a 79 y.o. female with past medical history of hypertension and atrial fibrillation dementia. Patient came in to the hospital after a fall. Patient was in her usual state of health until this morning when she was about to go to the bathroom, patient fell, and immediately she felt moderate to severe pain on her right hip. She does not have good recollection of what happened, the majority of the history was obtained from her husband and her son were at bedside at the time of the interview. Initial evaluation in the  emergency department showed no evidence of UTI, no evidence of pneumonia and bilateral hip x-rays did show no evidence of fractures. Patient was about to be discharged home, and she was asked to to with assistance, she felt like she is going to fall and her blood pressure went down. She also got a dose of fentanyl just before that. Patient will be admitted to the hospital for further evaluation.  Hospital Course:   Syncope  Syncope, likely secondary to orthostatic hypotension. Patient fell just after she stood, no evidence of fractures. Large hematoma on left thigh. Blood pressure medications reduced. Avapro and amlodipine discontinued.  3 sets of cardiac enzymes are negative. She received hydration with IVF, and is now stable.  Left thigh hematoma  From fall.  Extends from knee to groin medially and posteriorly.  Coumadin discontinued.   Will need physical therapy to reduce stiffness and improve mobility.  Status post bilateral hip replacement  Admitted with right hip pain, pain is improved, CT scan & x-rays did not show fractures.  Control pain with Tylenol and narcotics, likely to develop sundowning during night.  PT recommends SNF.   Hypertension  Continue bisoprolol because of rebound tachycardia.  Other BP meds discontinued.   BP has remained within a reasonable range (systolic in the 140s).  Dementia  She suffers with significant short term memory loss and depends on her son.  For example she does not remember that Dr Donnie Aho came to visit her last night.  As mentioned above she is likely to develop sundowning, especially with pain medications use.   Claustrophobia  Per son,  worse in hospital environment  On Xanax tid prn. Symptoms improved.  May benefit further from outpatient psychiatric evaluation.  Atrial fibrillation  History of atrial fibrillation, currently sinus rhythm, she is on Tikosyn and bisoprolol  Coumadin discontinued.  We appreciate Dr. York Spaniel consultation in the  hospital.  She will follow up with him as an outpatient in approx 3 weeks.   Abnormal EKG  EKG showed sinus rhythm with first-degree AV block and left bundle branch block.  No previous EKGs to compare, she denies any chest pain, negative cardiac enzymes so far.  Patient has 2/6 systolic murmur.  2D echo shows LVH.  Results below.  Procedures:  Study Conclusions  - Left ventricle: The cavity size was normal. Wall thickness was increased in a pattern of moderate LVH. Systolic function was normal. Wall motion was normal; there were no regional wall motion abnormalities. Doppler parameters are consistent with abnormal left ventricular relaxation (grade 1 diastolic dysfunction). - Aortic valve: Mild regurgitation. - Mitral valve: Moderate regurgitation directed posteriorly. - Right atrium: The atrium was mildly dilated. - Pulmonary arteries: PA peak pressure: 36mm Hg (S).   Consultations:  Cardiology (social visit)  Discharge Exam: Filed Vitals:   08/03/12 0631 08/03/12 1538 08/03/12 2136 08/04/12 0521  BP: 116/58 119/81 134/62 142/82  Pulse: 75 64 81 82  Temp:  97.7 F (36.5 C) 98.6 F (37 C) 98.6 F (37 C)  TempSrc:  Oral Oral Oral  Resp:  20 20 18   Height:      Weight:      SpO2:  99% 97% 96%    General: A&O, NAD, Pleasant, appears well Cardiovascular: RRR, no obvious M/R/G Respiratory: CTA, no W/C/R Abdomen:  Thin, soft, nt, nd, +BS Extremities:  Extremities:Large red/purple hematoma on left medial thigh from groin to above knee.   Discharge Instructions  Discharge Orders    Future Orders Please Complete By Expires   Diet - low sodium heart healthy      Increase activity slowly          Medication List     As of 08/04/2012 11:18 AM    STOP taking these medications         amLODipine 2.5 MG tablet   Commonly known as: NORVASC      valsartan 40 MG tablet   Commonly known as: DIOVAN      warfarin 6 MG tablet   Commonly known as: COUMADIN      TAKE  these medications         ALPRAZolam 0.25 MG tablet   Commonly known as: XANAX   Take 1 tablet (0.25 mg total) by mouth 3 (three) times daily as needed for anxiety or sleep.      aspirin 81 MG chewable tablet   Chew 1 tablet (81 mg total) by mouth daily.      BESIVANCE 0.6 % Susp   Generic drug: Besifloxacin HCl   Place 1 drop into the right eye 3 (three) times daily.      bisoprolol 10 MG tablet   Commonly known as: ZEBETA   Take 20 mg by mouth daily.      CALCIUM + D PO   Take 1 tablet by mouth 2 (two) times daily.      dofetilide 250 MCG capsule   Commonly known as: TIKOSYN   Take 250 mcg by mouth 2 (two) times daily.      DUREZOL 0.05 % Emul   Generic drug: Difluprednate   Place 1  drop into the right eye 2 (two) times daily.      estradiol 0.0375 MG/24HR   Commonly known as: VIVELLE-DOT   Place 1 patch onto the skin 3 (three) times a week.      multivitamin with minerals Tabs   Take 1 tablet by mouth daily.      nepafenac 0.1 % ophthalmic suspension   Commonly known as: NEVANAC   Place 3 drops into the right eye 3 (three) times daily.      oxyCODONE-acetaminophen 5-325 MG per tablet   Commonly known as: PERCOCET/ROXICET   Take 1 tablet by mouth every 6 (six) hours as needed for pain.      rivastigmine 9.5 mg/24hr   Commonly known as: EXELON   Place 1 patch onto the skin daily.           Follow-up Information    Follow up with TILLEY JR,W SPENCER, MD. Schedule an appointment as soon as possible for a visit in 3 weeks. [facility] follow up)    Contact information:   [address] [place] [address] [phone]       Follow up with Ginette Otto, MD. Schedule an appointment as soon as possible for a visit in 2 weeks.   Contact information:   [address] [place] [address] [phone]           The results of significant diagnostics from this hospitalization (including imaging, microbiology, ancillary  and laboratory) are listed below for reference.    Significant Diagnostic Studies: Dg Hip Complete Right  08/01/2012  *RADIOLOGY REPORT*  Clinical Data: Fall.  Right hip pain.  Hip replacement.  RIGHT HIP - COMPLETE 2+ VIEW  Comparison: 10/22/2009  Findings: Bilateral hip replacement noted with screw fixation of the acetabular shell.  No fracture, dislocation, or complicating feature observed. Spurring and reduced intervertebral disc space noted at the L4-5.  IMPRESSION:  1.  No fracture or significant complicating feature related to the right hip implant is observed.   Original Report Authenticated By: Gaylyn Rong, M.D.    Ct Hip Right Wo Contrast  08/02/2012  *RADIOLOGY REPORT*  Clinical Data: Fall.  Right hip pain.  Right thigh hematoma.  CT OF THE RIGHT HIP WITHOUT CONTRAST  Technique:  Multidetector CT imaging was performed according to the standard protocol. Multiplanar CT image reconstructions were also generated.  Comparison: Multiple exams, including 08/01/2012 radiographs and CT scan from 10/22/2009  Findings:  Extensive hematoma is present in the right hip adductor musculature and tracking caudad.  Surrounding edema noted.  No definite fracture is identified. There is considerable artifact around the implant on the O-MAR images, including irregularity of the anterior column of the acetabulum which is not readily apparent on the non O_MAR images and accordingly is thought to be artifact.  IMPRESSION:  1.  Considerable hematoma throughout theright hip adductor musculature, with surrounding stranding.  No definite fracture.  No dislocation.   Original Report Authenticated By: Gaylyn Rong, M.D.     Microbiology: No results found for this or any previous visit (from the past 240 hour(s)).   Labs: Basic Metabolic Panel:  Lab 08/01/12 5784  NA 137  K 3.7  CL 102  CO2 24  GLUCOSE 126*  BUN 14  CREATININE 0.74  CALCIUM 8.9  MG --  PHOS --   CBC:  Lab 08/01/12 0737  WBC  11.0*  NEUTROABS --  HGB 11.5*  HCT 34.5*  MCV 90.1  PLT  233   Cardiac Enzymes:  Lab 08/02/12 0800 08/01/12 2329 08/01/12 1645 08/01/12 1326  CKTOTAL -- -- -- --  CKMB -- -- -- --  CKMBINDEX -- -- -- --  TROPONINI <0.30 <0.30 <0.30 <0.30     SignedConley Canal [phone]  [facility] 08/04/2012, 11:18 AM

## 2012-08-04 NOTE — Progress Notes (Signed)
NURSING PROGRESS NOTE  [name] [id number] Discharge Data: 08/04/2012 3:17 PM Attending Provider: Clydia Llano, MD NWG:[id number],VHQ Maisie Fus, MD     Sandrea Matte to be D/C'd Skilled nursing facility per MD order.  Discussed with the patient the After Visit Summary and all questions fully answered. All IV's discontinued with no bleeding noted. All belongings returned to patient for patient to take home. Report called to facility.   Last Vital Signs:  Blood pressure 109/62, pulse 77, temperature 98.5 F (36.9 C), temperature source Oral, resp. rate 16, height 5\' 7"  (1.702 m), weight 63.9 kg (140 lb 14 oz), SpO2 96.00%.  Discharge Medication List   Medication List     As of 08/04/2012  3:17 PM    STOP taking these medications         amLODipine 2.5 MG tablet   Commonly known as: NORVASC      valsartan 40 MG tablet   Commonly known as: DIOVAN      warfarin 6 MG tablet   Commonly known as: COUMADIN      TAKE these medications         ALPRAZolam 0.25 MG tablet   Commonly known as: XANAX   Take 1 tablet (0.25 mg total) by mouth 3 (three) times daily as needed for anxiety or sleep.      aspirin 81 MG chewable tablet   Chew 1 tablet (81 mg total) by mouth daily.      BESIVANCE 0.6 % Susp   Generic drug: Besifloxacin HCl   Place 1 drop into the right eye 3 (three) times daily.      bisoprolol 10 MG tablet   Commonly known as: ZEBETA   Take 20 mg by mouth daily.      CALCIUM + D PO   Take 1 tablet by mouth 2 (two) times daily.      dofetilide 250 MCG capsule   Commonly known as: TIKOSYN   Take 250 mcg by mouth 2 (two) times daily.      DUREZOL 0.05 % Emul   Generic drug: Difluprednate   Place 1 drop into the right eye 2 (two) times daily.      estradiol 0.0375 MG/24HR   Commonly known as: VIVELLE-DOT   Place 1 patch onto the skin 3 (three) times a week.      multivitamin with minerals Tabs   Take 1 tablet by mouth daily.      nepafenac 0.1 %  ophthalmic suspension   Commonly known as: NEVANAC   Place 3 drops into the right eye 3 (three) times daily.      oxyCODONE-acetaminophen 5-325 MG per tablet   Commonly known as: PERCOCET/ROXICET   Take 1 tablet by mouth every 6 (six) hours as needed for pain.      rivastigmine 9.5 mg/24hr   Commonly known as: EXELON   Place 1 patch onto the skin daily.        Rosalie Doctor, RN

## 2012-08-04 NOTE — Discharge Summary (Signed)
Addendum  Patient seen and examined, chart and data base reviewed.  I agree with the above assessment and discharge plan.  For full details please see Mrs. Algis Downs PA note.  Large right hematoma, likely secondary to her fall.  Coumadin discontinued, started on low-dose aspirin for A Fib.   Followup with cardiology in 2-3 weeks to restart Coumadin as appropriate.   Clint Lipps, MD [facility] Pager: [phone] 08/04/2012, 12:38 PM

## 2012-08-04 NOTE — Clinical Social Work Note (Signed)
CSW was consulted to complete discharge of patient. Pt to transfer to [facility] today via [facility]. [place] and family are aware of d/c. D/C packet complete with chart copy, signed FL2, and signed hard Rx.  CSW signing off as no other CSW needs identified at this time.  Lia Foyer, LCSWA [facility] Clinical Social Worker Contact #: [phone]

## 2012-08-04 NOTE — Care Management Note (Signed)
    Page 1 of 1   08/04/2012     11:52:59 AM   CARE MANAGEMENT NOTE 08/04/2012  Patient:  [name], [name]   Account Number:  [id number]  Date Initiated:  08/04/2012  Documentation initiated by:  Letha Cape  Subjective/Objective Assessment:   dx syncope  admit- lives alone.     Action/Plan:   Anticipated DC Date:  08/04/2012   Anticipated DC Plan:  SKILLED NURSING FACILITY  In-house referral  Clinical Social Worker      DC Planning Services  CM consult      Choice offered to / List presented to:             Status of service:  Completed, signed off Medicare Important Message given?   (If response is "NO", the following Medicare IM given date fields will be blank) Date Medicare IM given:   Date Additional Medicare IM given:    Discharge Disposition:  SKILLED NURSING FACILITY  Per UR Regulation:  Reviewed for med. necessity/level of care/duration of stay  If discussed at Long Length of Stay Meetings, dates discussed:    Comments:  08/04/12 11:51 Letha Cape RN, BSN [phone] patient lives alone, patient is for dc to snf today, CSW following.

## 2012-08-04 NOTE — Discharge Summary (Signed)
Physician Discharge Summary  [name] ZOX:[id number] DOB: September 03, 1932 DOA: 08/01/2012  PCP: Ginette Otto, MD  Admit date: 08/01/2012 Discharge date: 08/04/2012  Time spent: 45 min.  Recommendations for Outpatient Follow-up:   She needs physical therapy and occupational therapy at the skilled nursing facility.  Patient fell and experienced a syncopal episode likely due to orthostatic hypotension.   She has been taken off of amlodipine and avapro but left on Bisoprolol.  Please monitor her blood pressure.  Consider letting it run slightly high to avoid orthostatic hypotension.  Patient was on Coumadin for atrial fibrillation.  This was discontinued due to her fall and large hematoma.  At some point - in combination with cardiology, a decision should be made about whether or not to restart it.  Patient complains of significant symptoms of claustrophobia.  Seems to do better with low dose xanax.  May benefit from further psychiatric consultation.  Discharge Diagnoses:  Principal Problem:  *Syncope Active Problems:  S/P bilateral hip replacements  HTN (hypertension)  Fall  Dementia  Hematoma of lower limb  Atrial fibrillation  Hypertensive heart disease  Mitral regurgitation   Discharge Condition: stable.   Diet recommendation: Heart Healthy  Filed Weights   08/01/12 1556  Weight: 63.9 kg (140 lb 14 oz)    History of present illness:  [name] is a 79 y.o. female with past medical history of hypertension and atrial fibrillation dementia. Patient came in to the hospital after a fall. Patient was in her usual state of health until this morning when she was about to go to the bathroom, patient fell, and immediately she felt moderate to severe pain on her right hip. She does not have good recollection of what happened, the majority of the history was obtained from her husband and her son were at bedside at the time of the interview. Initial evaluation in the  emergency department showed no evidence of UTI, no evidence of pneumonia and bilateral hip x-rays did show no evidence of fractures. Patient was about to be discharged home, and she was asked to to with assistance, she felt like she is going to fall and her blood pressure went down. She also got a dose of fentanyl just before that. Patient will be admitted to the hospital for further evaluation.  Hospital Course:   Syncope  Syncope, likely secondary to orthostatic hypotension. Patient fell just after she stood, no evidence of fractures. Large hematoma on left thigh. Blood pressure medications reduced. Avapro and amlodipine discontinued.  3 sets of cardiac enzymes are negative. She received hydration with IVF, and is now stable.  Left thigh hematoma  From fall.  Extends from knee to groin medially and posteriorly.  Coumadin discontinued.   Will need physical therapy to reduce stiffness and improve mobility.  Status post bilateral hip replacement  Admitted with right hip pain, pain is improved, CT scan & x-rays did not show fractures.  Control pain with Tylenol and narcotics, likely to develop sundowning during night.  PT recommends SNF.   Hypertension  Continue bisoprolol because of rebound tachycardia.  Other BP meds discontinued.   BP has remained within a reasonable range (systolic in the 140s).  Dementia  She suffers with significant short term memory loss and depends on her son.  For example she does not remember that Dr Donnie Aho came to visit her last night.  As mentioned above she is likely to develop sundowning, especially with pain medications use.   Claustrophobia  Per son,  worse in hospital environment  On Xanax tid prn. Symptoms improved.  May benefit further from outpatient psychiatric evaluation.  Atrial fibrillation  History of atrial fibrillation, currently sinus rhythm, she is on Tikosyn and bisoprolol  Coumadin discontinued.  We appreciate Dr. York Spaniel consultation in the  hospital.  She will follow up with him as an outpatient in approx 3 weeks.   Abnormal EKG  EKG showed sinus rhythm with first-degree AV block and left bundle branch block.  No previous EKGs to compare, she denies any chest pain, negative cardiac enzymes so far.  Patient has 2/6 systolic murmur.  2D echo shows LVH.  Results below.  Procedures:  Study Conclusions  - Left ventricle: The cavity size was normal. Wall thickness was increased in a pattern of moderate LVH. Systolic function was normal. Wall motion was normal; there were no regional wall motion abnormalities. Doppler parameters are consistent with abnormal left ventricular relaxation (grade 1 diastolic dysfunction). - Aortic valve: Mild regurgitation. - Mitral valve: Moderate regurgitation directed posteriorly. - Right atrium: The atrium was mildly dilated. - Pulmonary arteries: PA peak pressure: 36mm Hg (S).   Consultations:  Cardiology (social visit)  Discharge Exam: Filed Vitals:   08/03/12 0631 08/03/12 1538 08/03/12 2136 08/04/12 0521  BP: 116/58 119/81 134/62 142/82  Pulse: 75 64 81 82  Temp:  97.7 F (36.5 C) 98.6 F (37 C) 98.6 F (37 C)  TempSrc:  Oral Oral Oral  Resp:  20 20 18   Height:      Weight:      SpO2:  99% 97% 96%    General: A&O, NAD, Pleasant, appears well Cardiovascular: RRR, no obvious M/R/G Respiratory: CTA, no W/C/R Abdomen:  Thin, soft, nt, nd, +BS Extremities:  Extremities:Large red/purple hematoma on left medial thigh from groin to above knee.   Discharge Instructions      Discharge Orders    Future Orders Please Complete By Expires   Diet - low sodium heart healthy      Increase activity slowly          Medication List     As of 08/04/2012 12:31 PM    STOP taking these medications         amLODipine 2.5 MG tablet   Commonly known as: NORVASC      valsartan 40 MG tablet   Commonly known as: DIOVAN      warfarin 6 MG tablet   Commonly known as: COUMADIN        TAKE these medications         ALPRAZolam 0.25 MG tablet   Commonly known as: XANAX   Take 1 tablet (0.25 mg total) by mouth 3 (three) times daily as needed for anxiety or sleep.      aspirin 81 MG chewable tablet   Chew 1 tablet (81 mg total) by mouth daily.      BESIVANCE 0.6 % Susp   Generic drug: Besifloxacin HCl   Place 1 drop into the right eye 3 (three) times daily.      bisoprolol 10 MG tablet   Commonly known as: ZEBETA   Take 20 mg by mouth daily.      CALCIUM + D PO   Take 1 tablet by mouth 2 (two) times daily.      dofetilide 250 MCG capsule   Commonly known as: TIKOSYN   Take 250 mcg by mouth 2 (two) times daily.      DUREZOL 0.05 % Emul   Generic drug:  Difluprednate   Place 1 drop into the right eye 2 (two) times daily.      estradiol 0.0375 MG/24HR   Commonly known as: VIVELLE-DOT   Place 1 patch onto the skin 3 (three) times a week.      multivitamin with minerals Tabs   Take 1 tablet by mouth daily.      nepafenac 0.1 % ophthalmic suspension   Commonly known as: NEVANAC   Place 3 drops into the right eye 3 (three) times daily.      oxyCODONE-acetaminophen 5-325 MG per tablet   Commonly known as: PERCOCET/ROXICET   Take 1 tablet by mouth every 6 (six) hours as needed for pain.      rivastigmine 9.5 mg/24hr   Commonly known as: EXELON   Place 1 patch onto the skin daily.         Follow-up Information    Follow up with TILLEY JR,W SPENCER, MD. Schedule an appointment as soon as possible for a visit in 3 weeks. [facility] follow up)    Contact information:   [address] [place] [address] [phone]       Follow up with Ginette Otto, MD. Schedule an appointment as soon as possible for a visit in 2 weeks.   Contact information:   [address] [place] [address] [phone]           The results of significant diagnostics from this hospitalization (including imaging, microbiology,  ancillary and laboratory) are listed below for reference.    Significant Diagnostic Studies: Dg Hip Complete Right  08/01/2012  *RADIOLOGY REPORT*  Clinical Data: Fall.  Right hip pain.  Hip replacement.  RIGHT HIP - COMPLETE 2+ VIEW  Comparison: 10/22/2009  Findings: Bilateral hip replacement noted with screw fixation of the acetabular shell.  No fracture, dislocation, or complicating feature observed. Spurring and reduced intervertebral disc space noted at the L4-5.  IMPRESSION:  1.  No fracture or significant complicating feature related to the right hip implant is observed.   Original Report Authenticated By: Gaylyn Rong, M.D.    Ct Hip Right Wo Contrast  08/02/2012  *RADIOLOGY REPORT*  Clinical Data: Fall.  Right hip pain.  Right thigh hematoma.  CT OF THE RIGHT HIP WITHOUT CONTRAST  Technique:  Multidetector CT imaging was performed according to the standard protocol. Multiplanar CT image reconstructions were also generated.  Comparison: Multiple exams, including 08/01/2012 radiographs and CT scan from 10/22/2009  Findings:  Extensive hematoma is present in the right hip adductor musculature and tracking caudad.  Surrounding edema noted.  No definite fracture is identified. There is considerable artifact around the implant on the O-MAR images, including irregularity of the anterior column of the acetabulum which is not readily apparent on the non O_MAR images and accordingly is thought to be artifact.  IMPRESSION:  1.  Considerable hematoma throughout theright hip adductor musculature, with surrounding stranding.  No definite fracture.  No dislocation.   Original Report Authenticated By: Gaylyn Rong, M.D.     Microbiology: No results found for this or any previous visit (from the past 240 hour(s)).   Labs: Basic Metabolic Panel:  Lab 08/01/12 6962  NA 137  K 3.7  CL 102  CO2 24  GLUCOSE 126*  BUN 14  CREATININE 0.74  CALCIUM 8.9  MG --  PHOS --   CBC:  Lab 08/01/12  0737  WBC 11.0*  NEUTROABS --  HGB 11.5*  HCT 34.5*  MCV  90.1  PLT 233   Cardiac Enzymes:  Lab 08/02/12 0800 08/01/12 2329 08/01/12 1645 08/01/12 1326  CKTOTAL -- -- -- --  CKMB -- -- -- --  CKMBINDEX -- -- -- --  TROPONINI <0.30 <0.30 <0.30 <0.30     SignedConley Canal [phone]  [facility] 08/04/2012, 12:31 PM

## 2012-08-06 DIAGNOSIS — I10 Essential (primary) hypertension: Secondary | ICD-10-CM | POA: Diagnosis not present

## 2012-08-06 DIAGNOSIS — R7989 Other specified abnormal findings of blood chemistry: Secondary | ICD-10-CM | POA: Diagnosis not present

## 2012-08-06 DIAGNOSIS — F0390 Unspecified dementia without behavioral disturbance: Secondary | ICD-10-CM | POA: Diagnosis not present

## 2012-08-06 DIAGNOSIS — I4891 Unspecified atrial fibrillation: Secondary | ICD-10-CM | POA: Diagnosis not present

## 2012-08-24 DIAGNOSIS — I4891 Unspecified atrial fibrillation: Secondary | ICD-10-CM | POA: Diagnosis not present

## 2012-08-24 DIAGNOSIS — F411 Generalized anxiety disorder: Secondary | ICD-10-CM | POA: Diagnosis not present

## 2012-08-24 DIAGNOSIS — I119 Hypertensive heart disease without heart failure: Secondary | ICD-10-CM | POA: Diagnosis not present

## 2012-08-24 DIAGNOSIS — R41841 Cognitive communication deficit: Secondary | ICD-10-CM | POA: Diagnosis not present

## 2012-08-24 DIAGNOSIS — R269 Unspecified abnormalities of gait and mobility: Secondary | ICD-10-CM | POA: Diagnosis not present

## 2012-08-24 DIAGNOSIS — F0391 Unspecified dementia with behavioral disturbance: Secondary | ICD-10-CM | POA: Diagnosis not present

## 2012-08-26 ENCOUNTER — Other Ambulatory Visit: Payer: Self-pay | Admitting: Geriatric Medicine

## 2012-08-26 DIAGNOSIS — W19XXXA Unspecified fall, initial encounter: Secondary | ICD-10-CM | POA: Diagnosis not present

## 2012-08-26 DIAGNOSIS — E039 Hypothyroidism, unspecified: Secondary | ICD-10-CM | POA: Diagnosis not present

## 2012-08-26 DIAGNOSIS — F411 Generalized anxiety disorder: Secondary | ICD-10-CM | POA: Diagnosis not present

## 2012-08-26 DIAGNOSIS — R269 Unspecified abnormalities of gait and mobility: Secondary | ICD-10-CM | POA: Diagnosis not present

## 2012-08-26 DIAGNOSIS — I10 Essential (primary) hypertension: Secondary | ICD-10-CM | POA: Diagnosis not present

## 2012-08-27 DIAGNOSIS — I4891 Unspecified atrial fibrillation: Secondary | ICD-10-CM | POA: Diagnosis not present

## 2012-08-28 ENCOUNTER — Ambulatory Visit
Admission: RE | Admit: 2012-08-28 | Discharge: 2012-08-28 | Disposition: A | Discharge status: Home / Self Care | Payer: BLUE CROSS/BLUE SHIELD | Source: Ambulatory Visit | Attending: Geriatric Medicine | Admitting: Geriatric Medicine

## 2012-09-03 DIAGNOSIS — R269 Unspecified abnormalities of gait and mobility: Secondary | ICD-10-CM | POA: Diagnosis not present

## 2012-09-09 DIAGNOSIS — F411 Generalized anxiety disorder: Secondary | ICD-10-CM | POA: Diagnosis not present

## 2012-09-09 DIAGNOSIS — I4891 Unspecified atrial fibrillation: Secondary | ICD-10-CM | POA: Diagnosis not present

## 2012-09-09 DIAGNOSIS — I10 Essential (primary) hypertension: Secondary | ICD-10-CM | POA: Diagnosis not present

## 2012-09-28 DIAGNOSIS — H2510 Age-related nuclear cataract, unspecified eye: Secondary | ICD-10-CM | POA: Diagnosis not present

## 2012-10-14 ENCOUNTER — Other Ambulatory Visit: Payer: Self-pay | Admitting: Adult Health

## 2012-10-15 DIAGNOSIS — Z124 Encounter for screening for malignant neoplasm of cervix: Secondary | ICD-10-CM | POA: Diagnosis not present

## 2012-10-15 DIAGNOSIS — Z1231 Encounter for screening mammogram for malignant neoplasm of breast: Secondary | ICD-10-CM | POA: Diagnosis not present

## 2012-11-05 ENCOUNTER — Other Ambulatory Visit: Payer: Self-pay | Admitting: Cardiology

## 2012-11-05 ENCOUNTER — Ambulatory Visit
Admission: RE | Admit: 2012-11-05 | Discharge: 2012-11-05 | Disposition: A | Discharge status: Home / Self Care | Payer: Medicare Other | Source: Ambulatory Visit | Attending: Cardiology | Admitting: Cardiology

## 2012-11-05 DIAGNOSIS — R0989 Other specified symptoms and signs involving the circulatory and respiratory systems: Secondary | ICD-10-CM

## 2012-11-05 DIAGNOSIS — R0602 Shortness of breath: Secondary | ICD-10-CM | POA: Diagnosis not present

## 2012-11-05 DIAGNOSIS — R55 Syncope and collapse: Secondary | ICD-10-CM | POA: Diagnosis not present

## 2012-11-05 DIAGNOSIS — R0609 Other forms of dyspnea: Secondary | ICD-10-CM

## 2012-11-05 DIAGNOSIS — Z7901 Long term (current) use of anticoagulants: Secondary | ICD-10-CM | POA: Diagnosis not present

## 2012-11-05 DIAGNOSIS — I4949 Other premature depolarization: Secondary | ICD-10-CM | POA: Diagnosis not present

## 2012-11-05 DIAGNOSIS — I4891 Unspecified atrial fibrillation: Secondary | ICD-10-CM | POA: Diagnosis not present

## 2012-11-05 DIAGNOSIS — E785 Hyperlipidemia, unspecified: Secondary | ICD-10-CM | POA: Diagnosis not present

## 2012-11-05 DIAGNOSIS — I10 Essential (primary) hypertension: Secondary | ICD-10-CM | POA: Diagnosis not present

## 2012-11-05 DIAGNOSIS — I059 Rheumatic mitral valve disease, unspecified: Secondary | ICD-10-CM | POA: Diagnosis not present

## 2012-11-11 DIAGNOSIS — R413 Other amnesia: Secondary | ICD-10-CM | POA: Diagnosis not present

## 2012-11-11 DIAGNOSIS — F411 Generalized anxiety disorder: Secondary | ICD-10-CM | POA: Diagnosis not present

## 2012-11-11 DIAGNOSIS — I10 Essential (primary) hypertension: Secondary | ICD-10-CM | POA: Diagnosis not present

## 2012-11-24 ENCOUNTER — Other Ambulatory Visit: Payer: Self-pay | Admitting: Adult Health

## 2012-11-25 DIAGNOSIS — R0989 Other specified symptoms and signs involving the circulatory and respiratory systems: Secondary | ICD-10-CM | POA: Diagnosis not present

## 2012-11-25 DIAGNOSIS — R0609 Other forms of dyspnea: Secondary | ICD-10-CM | POA: Diagnosis not present

## 2012-12-03 DIAGNOSIS — T148XXA Other injury of unspecified body region, initial encounter: Secondary | ICD-10-CM | POA: Diagnosis not present

## 2012-12-08 DIAGNOSIS — F458 Other somatoform disorders: Secondary | ICD-10-CM | POA: Diagnosis not present

## 2012-12-08 DIAGNOSIS — Z4800 Encounter for change or removal of nonsurgical wound dressing: Secondary | ICD-10-CM | POA: Diagnosis not present

## 2012-12-15 ENCOUNTER — Other Ambulatory Visit: Payer: Self-pay | Admitting: Adult Health

## 2012-12-17 DIAGNOSIS — J040 Acute laryngitis: Secondary | ICD-10-CM | POA: Diagnosis not present

## 2012-12-24 DIAGNOSIS — J029 Acute pharyngitis, unspecified: Secondary | ICD-10-CM | POA: Diagnosis not present

## 2012-12-28 DIAGNOSIS — J029 Acute pharyngitis, unspecified: Secondary | ICD-10-CM | POA: Diagnosis not present

## 2013-01-07 DIAGNOSIS — J029 Acute pharyngitis, unspecified: Secondary | ICD-10-CM | POA: Diagnosis not present

## 2013-01-14 DIAGNOSIS — J029 Acute pharyngitis, unspecified: Secondary | ICD-10-CM | POA: Diagnosis not present

## 2013-01-20 DIAGNOSIS — L821 Other seborrheic keratosis: Secondary | ICD-10-CM | POA: Diagnosis not present

## 2013-01-20 DIAGNOSIS — I10 Essential (primary) hypertension: Secondary | ICD-10-CM | POA: Diagnosis not present

## 2013-01-20 DIAGNOSIS — G309 Alzheimer's disease, unspecified: Secondary | ICD-10-CM | POA: Diagnosis not present

## 2013-01-20 DIAGNOSIS — F0280 Dementia in other diseases classified elsewhere without behavioral disturbance: Secondary | ICD-10-CM | POA: Diagnosis not present

## 2013-03-01 DIAGNOSIS — F411 Generalized anxiety disorder: Secondary | ICD-10-CM | POA: Diagnosis not present

## 2013-03-01 DIAGNOSIS — R42 Dizziness and giddiness: Secondary | ICD-10-CM | POA: Diagnosis not present

## 2013-03-01 DIAGNOSIS — F0280 Dementia in other diseases classified elsewhere without behavioral disturbance: Secondary | ICD-10-CM | POA: Diagnosis not present

## 2013-03-02 DIAGNOSIS — L819 Disorder of pigmentation, unspecified: Secondary | ICD-10-CM | POA: Diagnosis not present

## 2013-03-02 DIAGNOSIS — L570 Actinic keratosis: Secondary | ICD-10-CM | POA: Diagnosis not present

## 2013-03-02 DIAGNOSIS — L821 Other seborrheic keratosis: Secondary | ICD-10-CM | POA: Diagnosis not present

## 2013-03-02 DIAGNOSIS — D2330 Other benign neoplasm of skin of unspecified part of face: Secondary | ICD-10-CM | POA: Diagnosis not present

## 2013-03-19 ENCOUNTER — Other Ambulatory Visit: Payer: Self-pay | Admitting: Adult Health

## 2013-04-01 ENCOUNTER — Other Ambulatory Visit: Payer: Self-pay | Admitting: Nurse Practitioner

## 2013-04-01 DIAGNOSIS — R195 Other fecal abnormalities: Secondary | ICD-10-CM | POA: Diagnosis not present

## 2013-04-01 DIAGNOSIS — R634 Abnormal weight loss: Secondary | ICD-10-CM | POA: Diagnosis not present

## 2013-04-05 ENCOUNTER — Ambulatory Visit (HOSPITAL_COMMUNITY)
Admission: RE | Admit: 2013-04-05 | Discharge: 2013-04-05 | Disposition: A | Discharge status: Home / Self Care | Payer: Medicare Other | Source: Ambulatory Visit | Attending: Nurse Practitioner | Admitting: Nurse Practitioner

## 2013-04-05 DIAGNOSIS — R634 Abnormal weight loss: Secondary | ICD-10-CM | POA: Insufficient documentation

## 2013-04-05 DIAGNOSIS — R1319 Other dysphagia: Secondary | ICD-10-CM | POA: Diagnosis not present

## 2013-04-05 DIAGNOSIS — R195 Other fecal abnormalities: Secondary | ICD-10-CM

## 2013-04-09 ENCOUNTER — Other Ambulatory Visit: Payer: Medicare Other

## 2013-04-29 DIAGNOSIS — R634 Abnormal weight loss: Secondary | ICD-10-CM | POA: Diagnosis not present

## 2013-04-29 DIAGNOSIS — R195 Other fecal abnormalities: Secondary | ICD-10-CM | POA: Diagnosis not present

## 2013-05-06 DIAGNOSIS — Z23 Encounter for immunization: Secondary | ICD-10-CM | POA: Diagnosis not present

## 2013-05-20 DIAGNOSIS — I4949 Other premature depolarization: Secondary | ICD-10-CM | POA: Diagnosis not present

## 2013-05-20 DIAGNOSIS — R0609 Other forms of dyspnea: Secondary | ICD-10-CM | POA: Diagnosis not present

## 2013-05-20 DIAGNOSIS — R55 Syncope and collapse: Secondary | ICD-10-CM | POA: Diagnosis not present

## 2013-05-20 DIAGNOSIS — E785 Hyperlipidemia, unspecified: Secondary | ICD-10-CM | POA: Diagnosis not present

## 2013-05-20 DIAGNOSIS — I4891 Unspecified atrial fibrillation: Secondary | ICD-10-CM | POA: Diagnosis not present

## 2013-05-20 DIAGNOSIS — Z7901 Long term (current) use of anticoagulants: Secondary | ICD-10-CM | POA: Diagnosis not present

## 2013-05-20 DIAGNOSIS — I059 Rheumatic mitral valve disease, unspecified: Secondary | ICD-10-CM | POA: Diagnosis not present

## 2013-05-20 DIAGNOSIS — I10 Essential (primary) hypertension: Secondary | ICD-10-CM | POA: Diagnosis not present

## 2013-05-25 DIAGNOSIS — F0280 Dementia in other diseases classified elsewhere without behavioral disturbance: Secondary | ICD-10-CM | POA: Diagnosis not present

## 2013-05-25 DIAGNOSIS — M76899 Other specified enthesopathies of unspecified lower limb, excluding foot: Secondary | ICD-10-CM | POA: Diagnosis not present

## 2013-05-25 DIAGNOSIS — Z23 Encounter for immunization: Secondary | ICD-10-CM | POA: Diagnosis not present

## 2013-05-25 DIAGNOSIS — M25469 Effusion, unspecified knee: Secondary | ICD-10-CM | POA: Diagnosis not present

## 2013-05-25 DIAGNOSIS — Z0000 Encounter for general adult medical examination without abnormal findings: Secondary | ICD-10-CM | POA: Diagnosis not present

## 2013-05-25 DIAGNOSIS — Z1331 Encounter for screening for depression: Secondary | ICD-10-CM | POA: Diagnosis not present

## 2013-06-07 DIAGNOSIS — N390 Urinary tract infection, site not specified: Secondary | ICD-10-CM | POA: Diagnosis not present

## 2013-06-07 DIAGNOSIS — B951 Streptococcus, group B, as the cause of diseases classified elsewhere: Secondary | ICD-10-CM | POA: Diagnosis present

## 2013-06-07 DIAGNOSIS — F411 Generalized anxiety disorder: Secondary | ICD-10-CM | POA: Diagnosis present

## 2013-06-07 DIAGNOSIS — J810 Acute pulmonary edema: Secondary | ICD-10-CM | POA: Diagnosis not present

## 2013-06-07 DIAGNOSIS — J9600 Acute respiratory failure, unspecified whether with hypoxia or hypercapnia: Secondary | ICD-10-CM | POA: Diagnosis not present

## 2013-06-07 DIAGNOSIS — F0280 Dementia in other diseases classified elsewhere without behavioral disturbance: Secondary | ICD-10-CM | POA: Diagnosis present

## 2013-06-07 DIAGNOSIS — T50995A Adverse effect of other drugs, medicaments and biological substances, initial encounter: Secondary | ICD-10-CM | POA: Diagnosis not present

## 2013-06-07 DIAGNOSIS — I10 Essential (primary) hypertension: Secondary | ICD-10-CM | POA: Diagnosis not present

## 2013-06-07 DIAGNOSIS — I319 Disease of pericardium, unspecified: Secondary | ICD-10-CM | POA: Diagnosis present

## 2013-06-07 DIAGNOSIS — R9431 Abnormal electrocardiogram [ECG] [EKG]: Secondary | ICD-10-CM | POA: Diagnosis not present

## 2013-06-07 DIAGNOSIS — I059 Rheumatic mitral valve disease, unspecified: Secondary | ICD-10-CM | POA: Diagnosis not present

## 2013-06-07 DIAGNOSIS — N5089 Other specified disorders of the male genital organs: Secondary | ICD-10-CM | POA: Diagnosis not present

## 2013-06-07 DIAGNOSIS — R092 Respiratory arrest: Secondary | ICD-10-CM | POA: Diagnosis not present

## 2013-06-07 DIAGNOSIS — E870 Hyperosmolality and hypernatremia: Secondary | ICD-10-CM | POA: Diagnosis not present

## 2013-06-07 DIAGNOSIS — E872 Acidosis: Secondary | ICD-10-CM | POA: Diagnosis not present

## 2013-06-07 DIAGNOSIS — E871 Hypo-osmolality and hyponatremia: Secondary | ICD-10-CM | POA: Diagnosis not present

## 2013-06-07 DIAGNOSIS — I5181 Takotsubo syndrome: Secondary | ICD-10-CM | POA: Diagnosis present

## 2013-06-07 DIAGNOSIS — D649 Anemia, unspecified: Secondary | ICD-10-CM | POA: Diagnosis not present

## 2013-06-07 DIAGNOSIS — I472 Ventricular tachycardia: Secondary | ICD-10-CM | POA: Diagnosis present

## 2013-06-07 DIAGNOSIS — J189 Pneumonia, unspecified organism: Secondary | ICD-10-CM | POA: Diagnosis not present

## 2013-06-07 DIAGNOSIS — I447 Left bundle-branch block, unspecified: Secondary | ICD-10-CM | POA: Diagnosis not present

## 2013-06-07 DIAGNOSIS — I2510 Atherosclerotic heart disease of native coronary artery without angina pectoris: Secondary | ICD-10-CM | POA: Diagnosis not present

## 2013-06-07 DIAGNOSIS — E039 Hypothyroidism, unspecified: Secondary | ICD-10-CM | POA: Diagnosis not present

## 2013-06-07 DIAGNOSIS — I509 Heart failure, unspecified: Secondary | ICD-10-CM | POA: Diagnosis present

## 2013-06-07 DIAGNOSIS — I4891 Unspecified atrial fibrillation: Secondary | ICD-10-CM | POA: Diagnosis not present

## 2013-06-07 DIAGNOSIS — E876 Hypokalemia: Secondary | ICD-10-CM | POA: Diagnosis not present

## 2013-06-07 DIAGNOSIS — I5021 Acute systolic (congestive) heart failure: Secondary | ICD-10-CM | POA: Diagnosis not present

## 2013-06-07 DIAGNOSIS — A498 Other bacterial infections of unspecified site: Secondary | ICD-10-CM | POA: Diagnosis present

## 2013-06-14 DIAGNOSIS — I059 Rheumatic mitral valve disease, unspecified: Secondary | ICD-10-CM | POA: Diagnosis not present

## 2013-06-14 DIAGNOSIS — Z7901 Long term (current) use of anticoagulants: Secondary | ICD-10-CM | POA: Diagnosis not present

## 2013-06-14 DIAGNOSIS — E785 Hyperlipidemia, unspecified: Secondary | ICD-10-CM | POA: Diagnosis not present

## 2013-06-14 DIAGNOSIS — I10 Essential (primary) hypertension: Secondary | ICD-10-CM | POA: Diagnosis not present

## 2013-06-14 DIAGNOSIS — I4949 Other premature depolarization: Secondary | ICD-10-CM | POA: Diagnosis not present

## 2013-06-14 DIAGNOSIS — I501 Left ventricular failure: Secondary | ICD-10-CM | POA: Diagnosis not present

## 2013-06-14 DIAGNOSIS — I4891 Unspecified atrial fibrillation: Secondary | ICD-10-CM | POA: Diagnosis not present

## 2013-06-14 DIAGNOSIS — Z79899 Other long term (current) drug therapy: Secondary | ICD-10-CM | POA: Diagnosis not present

## 2013-06-15 DIAGNOSIS — F411 Generalized anxiety disorder: Secondary | ICD-10-CM | POA: Diagnosis not present

## 2013-06-15 DIAGNOSIS — E871 Hypo-osmolality and hyponatremia: Secondary | ICD-10-CM | POA: Diagnosis not present

## 2013-06-15 DIAGNOSIS — F0280 Dementia in other diseases classified elsewhere without behavioral disturbance: Secondary | ICD-10-CM | POA: Diagnosis not present

## 2013-06-15 DIAGNOSIS — I4891 Unspecified atrial fibrillation: Secondary | ICD-10-CM | POA: Diagnosis not present

## 2013-06-15 DIAGNOSIS — I10 Essential (primary) hypertension: Secondary | ICD-10-CM | POA: Diagnosis not present

## 2013-07-11 IMAGING — CR DG HIP COMPLETE 2+V*R*
3 series · 3 of 3 positions shown · non-contrast
Comparison: 10/22/2009

CLINICAL DATA: Fall.  Right hip pain.  Hip replacement.

RIGHT HIP - COMPLETE 2+ VIEW

[t pelvis ap]
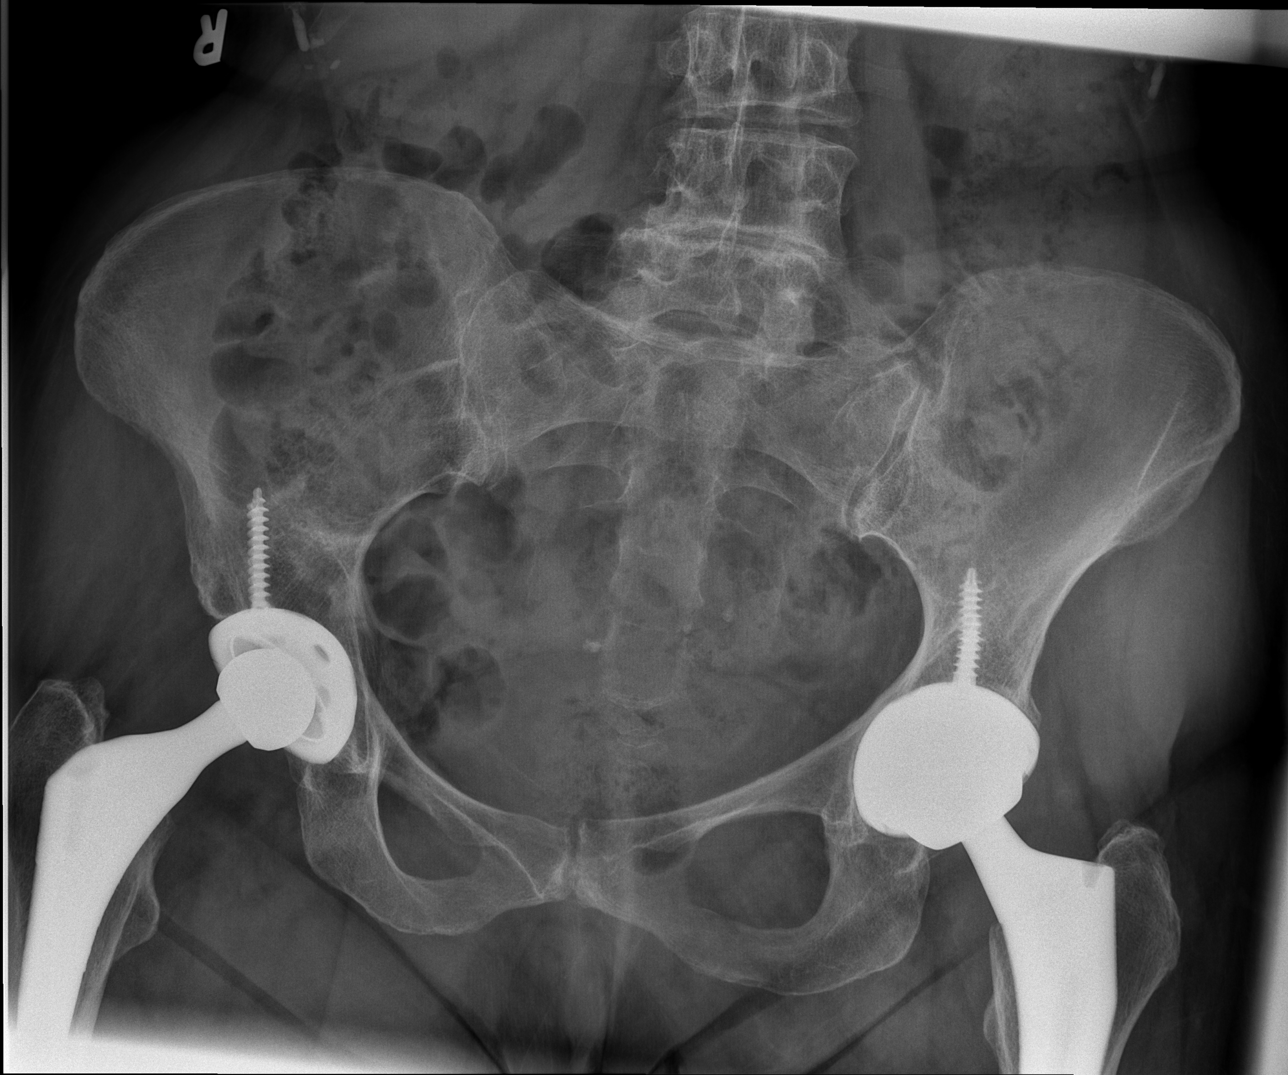

[t hip ap right]
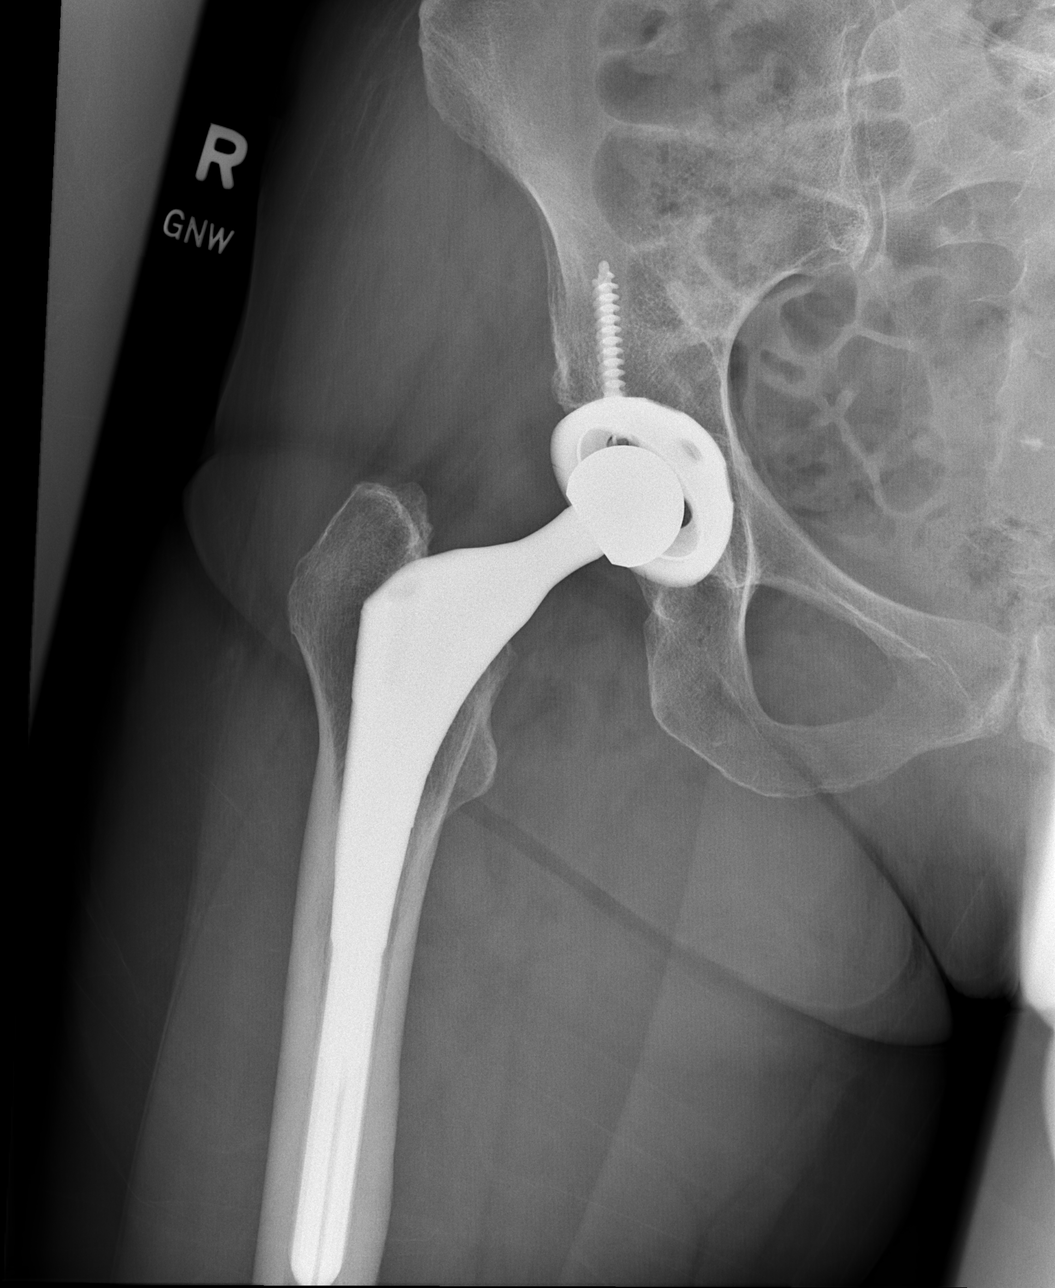

[t hip frog leg right]
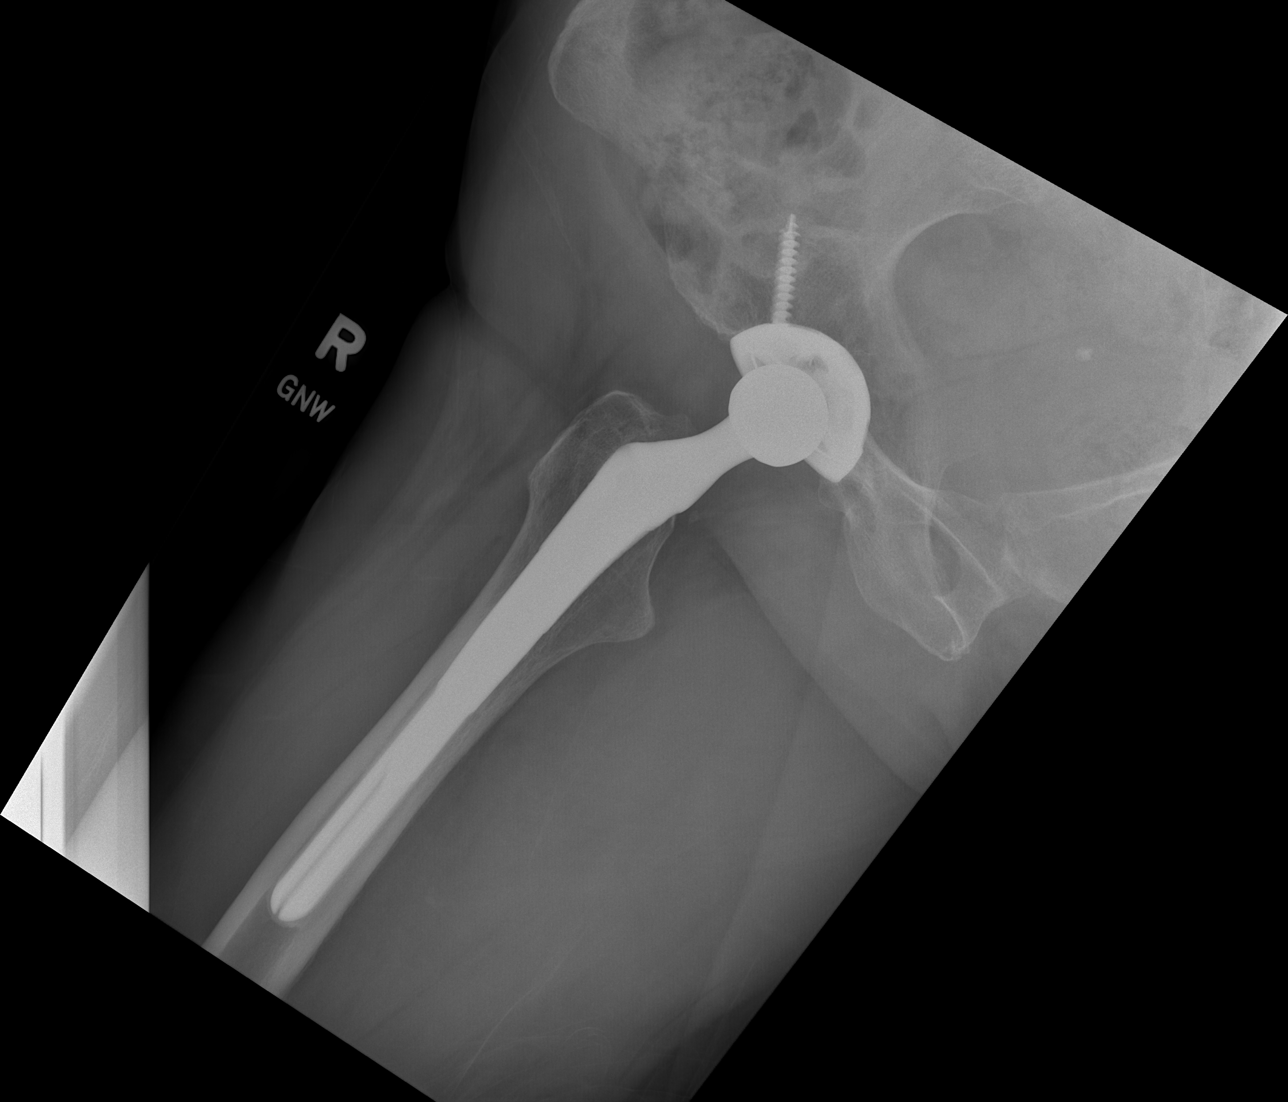

[3 of 3 positions shown; findings below may reference images not displayed]

FINDINGS: Bilateral hip replacement noted with screw fixation of
the acetabular shell.

No fracture, dislocation, or complicating feature observed.
Spurring and reduced intervertebral disc space noted at the L4-5.
IMPRESSION: 1.  No fracture or significant complicating feature related to the
right hip implant is observed.

## 2013-07-13 DIAGNOSIS — M25559 Pain in unspecified hip: Secondary | ICD-10-CM | POA: Diagnosis not present

## 2013-07-13 DIAGNOSIS — M549 Dorsalgia, unspecified: Secondary | ICD-10-CM | POA: Diagnosis not present

## 2013-08-06 DIAGNOSIS — M25559 Pain in unspecified hip: Secondary | ICD-10-CM | POA: Diagnosis not present

## 2013-08-06 DIAGNOSIS — F0280 Dementia in other diseases classified elsewhere without behavioral disturbance: Secondary | ICD-10-CM | POA: Diagnosis not present

## 2013-08-06 DIAGNOSIS — G479 Sleep disorder, unspecified: Secondary | ICD-10-CM | POA: Diagnosis not present

## 2013-08-06 DIAGNOSIS — I10 Essential (primary) hypertension: Secondary | ICD-10-CM | POA: Diagnosis not present

## 2013-08-06 DIAGNOSIS — M25469 Effusion, unspecified knee: Secondary | ICD-10-CM | POA: Diagnosis not present

## 2013-08-06 DIAGNOSIS — L821 Other seborrheic keratosis: Secondary | ICD-10-CM | POA: Diagnosis not present

## 2013-08-06 DIAGNOSIS — G309 Alzheimer's disease, unspecified: Secondary | ICD-10-CM | POA: Diagnosis not present

## 2013-08-10 DIAGNOSIS — R350 Frequency of micturition: Secondary | ICD-10-CM | POA: Diagnosis not present

## 2013-08-17 DIAGNOSIS — N390 Urinary tract infection, site not specified: Secondary | ICD-10-CM | POA: Diagnosis not present

## 2013-09-01 DIAGNOSIS — D1801 Hemangioma of skin and subcutaneous tissue: Secondary | ICD-10-CM | POA: Diagnosis not present

## 2013-09-01 DIAGNOSIS — L821 Other seborrheic keratosis: Secondary | ICD-10-CM | POA: Diagnosis not present

## 2013-09-08 DIAGNOSIS — I10 Essential (primary) hypertension: Secondary | ICD-10-CM | POA: Diagnosis not present

## 2013-09-08 DIAGNOSIS — N390 Urinary tract infection, site not specified: Secondary | ICD-10-CM | POA: Diagnosis not present

## 2013-09-08 DIAGNOSIS — M25469 Effusion, unspecified knee: Secondary | ICD-10-CM | POA: Diagnosis not present

## 2013-09-08 DIAGNOSIS — M25559 Pain in unspecified hip: Secondary | ICD-10-CM | POA: Diagnosis not present

## 2013-09-20 DIAGNOSIS — M1710 Unilateral primary osteoarthritis, unspecified knee: Secondary | ICD-10-CM | POA: Diagnosis not present

## 2013-09-20 DIAGNOSIS — M549 Dorsalgia, unspecified: Secondary | ICD-10-CM | POA: Diagnosis not present

## 2013-09-20 DIAGNOSIS — M25559 Pain in unspecified hip: Secondary | ICD-10-CM | POA: Diagnosis not present

## 2013-09-25 DIAGNOSIS — M25559 Pain in unspecified hip: Secondary | ICD-10-CM | POA: Diagnosis not present

## 2013-09-27 DIAGNOSIS — M25559 Pain in unspecified hip: Secondary | ICD-10-CM | POA: Diagnosis not present

## 2013-10-04 DIAGNOSIS — I4891 Unspecified atrial fibrillation: Secondary | ICD-10-CM | POA: Diagnosis not present

## 2013-10-12 DIAGNOSIS — R159 Full incontinence of feces: Secondary | ICD-10-CM | POA: Diagnosis not present

## 2013-10-12 DIAGNOSIS — R197 Diarrhea, unspecified: Secondary | ICD-10-CM | POA: Diagnosis not present

## 2013-10-14 DIAGNOSIS — M1710 Unilateral primary osteoarthritis, unspecified knee: Secondary | ICD-10-CM | POA: Diagnosis not present

## 2013-10-14 DIAGNOSIS — M25559 Pain in unspecified hip: Secondary | ICD-10-CM | POA: Diagnosis not present

## 2013-10-18 DIAGNOSIS — Z1231 Encounter for screening mammogram for malignant neoplasm of breast: Secondary | ICD-10-CM | POA: Diagnosis not present

## 2013-10-18 DIAGNOSIS — Z124 Encounter for screening for malignant neoplasm of cervix: Secondary | ICD-10-CM | POA: Diagnosis not present

## 2013-11-02 DIAGNOSIS — R197 Diarrhea, unspecified: Secondary | ICD-10-CM | POA: Diagnosis not present

## 2013-11-23 DIAGNOSIS — M25469 Effusion, unspecified knee: Secondary | ICD-10-CM | POA: Diagnosis not present

## 2013-11-23 DIAGNOSIS — F0280 Dementia in other diseases classified elsewhere without behavioral disturbance: Secondary | ICD-10-CM | POA: Diagnosis not present

## 2013-11-23 DIAGNOSIS — R197 Diarrhea, unspecified: Secondary | ICD-10-CM | POA: Diagnosis not present

## 2013-11-23 DIAGNOSIS — I4891 Unspecified atrial fibrillation: Secondary | ICD-10-CM | POA: Diagnosis not present

## 2013-11-23 DIAGNOSIS — I10 Essential (primary) hypertension: Secondary | ICD-10-CM | POA: Diagnosis not present

## 2013-11-23 DIAGNOSIS — G309 Alzheimer's disease, unspecified: Secondary | ICD-10-CM | POA: Diagnosis not present

## 2013-12-07 DIAGNOSIS — R634 Abnormal weight loss: Secondary | ICD-10-CM | POA: Diagnosis not present

## 2013-12-07 DIAGNOSIS — R197 Diarrhea, unspecified: Secondary | ICD-10-CM | POA: Diagnosis not present

## 2013-12-07 DIAGNOSIS — I498 Other specified cardiac arrhythmias: Secondary | ICD-10-CM | POA: Diagnosis not present

## 2013-12-08 DIAGNOSIS — Z79899 Other long term (current) drug therapy: Secondary | ICD-10-CM | POA: Diagnosis not present

## 2013-12-08 DIAGNOSIS — R197 Diarrhea, unspecified: Secondary | ICD-10-CM | POA: Diagnosis not present

## 2013-12-22 DIAGNOSIS — H2510 Age-related nuclear cataract, unspecified eye: Secondary | ICD-10-CM | POA: Diagnosis not present

## 2013-12-22 DIAGNOSIS — Z961 Presence of intraocular lens: Secondary | ICD-10-CM

## 2013-12-22 DIAGNOSIS — Z79899 Other long term (current) drug therapy: Secondary | ICD-10-CM | POA: Diagnosis not present

## 2013-12-22 DIAGNOSIS — H2640 Unspecified secondary cataract: Secondary | ICD-10-CM | POA: Diagnosis not present

## 2013-12-22 DIAGNOSIS — H26491 Other secondary cataract, right eye: Secondary | ICD-10-CM

## 2013-12-22 DIAGNOSIS — E7800 Pure hypercholesterolemia, unspecified: Secondary | ICD-10-CM | POA: Diagnosis not present

## 2013-12-22 HISTORY — DX: Presence of intraocular lens: Z96.1

## 2013-12-22 HISTORY — DX: Other secondary cataract, right eye: H26.491

## 2013-12-30 DIAGNOSIS — F0280 Dementia in other diseases classified elsewhere without behavioral disturbance: Secondary | ICD-10-CM | POA: Diagnosis not present

## 2013-12-30 DIAGNOSIS — E46 Unspecified protein-calorie malnutrition: Secondary | ICD-10-CM | POA: Diagnosis not present

## 2013-12-30 DIAGNOSIS — R197 Diarrhea, unspecified: Secondary | ICD-10-CM | POA: Diagnosis not present

## 2013-12-30 DIAGNOSIS — I10 Essential (primary) hypertension: Secondary | ICD-10-CM | POA: Diagnosis not present

## 2013-12-30 DIAGNOSIS — G309 Alzheimer's disease, unspecified: Secondary | ICD-10-CM | POA: Diagnosis not present

## 2013-12-30 DIAGNOSIS — Z79899 Other long term (current) drug therapy: Secondary | ICD-10-CM | POA: Diagnosis not present

## 2014-01-10 ENCOUNTER — Other Ambulatory Visit: Payer: Self-pay | Admitting: Adult Health

## 2014-01-12 DIAGNOSIS — I4891 Unspecified atrial fibrillation: Secondary | ICD-10-CM | POA: Diagnosis not present

## 2014-01-12 DIAGNOSIS — I10 Essential (primary) hypertension: Secondary | ICD-10-CM | POA: Diagnosis not present

## 2014-01-12 DIAGNOSIS — Z7901 Long term (current) use of anticoagulants: Secondary | ICD-10-CM | POA: Diagnosis not present

## 2014-01-12 DIAGNOSIS — E785 Hyperlipidemia, unspecified: Secondary | ICD-10-CM | POA: Diagnosis not present

## 2014-01-12 DIAGNOSIS — I059 Rheumatic mitral valve disease, unspecified: Secondary | ICD-10-CM | POA: Diagnosis not present

## 2014-01-12 DIAGNOSIS — I4949 Other premature depolarization: Secondary | ICD-10-CM | POA: Diagnosis not present

## 2014-01-12 DIAGNOSIS — Z79899 Other long term (current) drug therapy: Secondary | ICD-10-CM | POA: Diagnosis not present

## 2014-01-12 DIAGNOSIS — I501 Left ventricular failure: Secondary | ICD-10-CM | POA: Diagnosis not present

## 2014-01-20 DIAGNOSIS — F0280 Dementia in other diseases classified elsewhere without behavioral disturbance: Secondary | ICD-10-CM | POA: Diagnosis not present

## 2014-01-20 DIAGNOSIS — Z79899 Other long term (current) drug therapy: Secondary | ICD-10-CM | POA: Diagnosis not present

## 2014-01-20 DIAGNOSIS — G309 Alzheimer's disease, unspecified: Secondary | ICD-10-CM | POA: Diagnosis not present

## 2014-02-11 DIAGNOSIS — R634 Abnormal weight loss: Secondary | ICD-10-CM | POA: Diagnosis not present

## 2014-02-11 DIAGNOSIS — I10 Essential (primary) hypertension: Secondary | ICD-10-CM | POA: Diagnosis not present

## 2014-02-11 DIAGNOSIS — I4891 Unspecified atrial fibrillation: Secondary | ICD-10-CM | POA: Diagnosis not present

## 2014-02-11 DIAGNOSIS — Z23 Encounter for immunization: Secondary | ICD-10-CM | POA: Diagnosis not present

## 2014-02-11 DIAGNOSIS — G309 Alzheimer's disease, unspecified: Secondary | ICD-10-CM | POA: Diagnosis not present

## 2014-02-11 DIAGNOSIS — F0280 Dementia in other diseases classified elsewhere without behavioral disturbance: Secondary | ICD-10-CM | POA: Diagnosis not present

## 2014-02-11 DIAGNOSIS — E039 Hypothyroidism, unspecified: Secondary | ICD-10-CM | POA: Diagnosis not present

## 2014-03-08 DIAGNOSIS — L819 Disorder of pigmentation, unspecified: Secondary | ICD-10-CM | POA: Diagnosis not present

## 2014-03-08 DIAGNOSIS — L821 Other seborrheic keratosis: Secondary | ICD-10-CM | POA: Diagnosis not present

## 2014-03-08 DIAGNOSIS — D239 Other benign neoplasm of skin, unspecified: Secondary | ICD-10-CM | POA: Diagnosis not present

## 2014-03-08 DIAGNOSIS — D692 Other nonthrombocytopenic purpura: Secondary | ICD-10-CM | POA: Diagnosis not present

## 2014-03-08 DIAGNOSIS — L259 Unspecified contact dermatitis, unspecified cause: Secondary | ICD-10-CM | POA: Diagnosis not present

## 2014-03-08 DIAGNOSIS — L253 Unspecified contact dermatitis due to other chemical products: Secondary | ICD-10-CM | POA: Diagnosis not present

## 2014-03-09 DIAGNOSIS — I4891 Unspecified atrial fibrillation: Secondary | ICD-10-CM | POA: Diagnosis not present

## 2014-03-09 DIAGNOSIS — I10 Essential (primary) hypertension: Secondary | ICD-10-CM | POA: Diagnosis not present

## 2014-03-09 DIAGNOSIS — R002 Palpitations: Secondary | ICD-10-CM | POA: Diagnosis not present

## 2014-03-09 DIAGNOSIS — Z7901 Long term (current) use of anticoagulants: Secondary | ICD-10-CM | POA: Diagnosis not present

## 2014-03-09 DIAGNOSIS — I059 Rheumatic mitral valve disease, unspecified: Secondary | ICD-10-CM | POA: Diagnosis not present

## 2014-03-09 DIAGNOSIS — I4949 Other premature depolarization: Secondary | ICD-10-CM | POA: Diagnosis not present

## 2014-04-18 DIAGNOSIS — R600 Localized edema: Secondary | ICD-10-CM | POA: Diagnosis not present

## 2014-04-18 DIAGNOSIS — G301 Alzheimer's disease with late onset: Secondary | ICD-10-CM | POA: Diagnosis not present

## 2014-04-18 DIAGNOSIS — Z79899 Other long term (current) drug therapy: Secondary | ICD-10-CM | POA: Diagnosis not present

## 2014-04-18 DIAGNOSIS — I482 Chronic atrial fibrillation: Secondary | ICD-10-CM | POA: Diagnosis not present

## 2014-04-19 ENCOUNTER — Emergency Department (HOSPITAL_COMMUNITY): Payer: Medicare Other

## 2014-04-19 ENCOUNTER — Encounter (HOSPITAL_COMMUNITY): Payer: Self-pay | Admitting: Emergency Medicine

## 2014-04-19 ENCOUNTER — Inpatient Hospital Stay (HOSPITAL_COMMUNITY)
Admission: EM | Admit: 2014-04-19 | Discharge: 2014-04-22 | DRG: 292 | Disposition: A | Discharge status: Home Health | Payer: Medicare Other | Attending: Cardiology | Admitting: Cardiology

## 2014-04-19 ENCOUNTER — Other Ambulatory Visit (HOSPITAL_COMMUNITY): Payer: Self-pay | Admitting: Geriatric Medicine

## 2014-04-19 ENCOUNTER — Encounter (HOSPITAL_COMMUNITY): Payer: Medicare Other

## 2014-04-19 DIAGNOSIS — Z79899 Other long term (current) drug therapy: Secondary | ICD-10-CM

## 2014-04-19 DIAGNOSIS — I340 Nonrheumatic mitral (valve) insufficiency: Secondary | ICD-10-CM | POA: Diagnosis present

## 2014-04-19 DIAGNOSIS — I319 Disease of pericardium, unspecified: Secondary | ICD-10-CM | POA: Diagnosis not present

## 2014-04-19 DIAGNOSIS — I482 Chronic atrial fibrillation, unspecified: Secondary | ICD-10-CM

## 2014-04-19 DIAGNOSIS — I5021 Acute systolic (congestive) heart failure: Secondary | ICD-10-CM | POA: Diagnosis not present

## 2014-04-19 DIAGNOSIS — M1990 Unspecified osteoarthritis, unspecified site: Secondary | ICD-10-CM | POA: Diagnosis present

## 2014-04-19 DIAGNOSIS — N3289 Other specified disorders of bladder: Secondary | ICD-10-CM | POA: Diagnosis present

## 2014-04-19 DIAGNOSIS — Z96649 Presence of unspecified artificial hip joint: Secondary | ICD-10-CM | POA: Diagnosis present

## 2014-04-19 DIAGNOSIS — I5031 Acute diastolic (congestive) heart failure: Secondary | ICD-10-CM | POA: Diagnosis not present

## 2014-04-19 DIAGNOSIS — I272 Other secondary pulmonary hypertension: Secondary | ICD-10-CM | POA: Diagnosis present

## 2014-04-19 DIAGNOSIS — I5041 Acute combined systolic (congestive) and diastolic (congestive) heart failure: Secondary | ICD-10-CM | POA: Diagnosis present

## 2014-04-19 DIAGNOSIS — I10 Essential (primary) hypertension: Secondary | ICD-10-CM

## 2014-04-19 DIAGNOSIS — F0390 Unspecified dementia without behavioral disturbance: Secondary | ICD-10-CM | POA: Diagnosis present

## 2014-04-19 DIAGNOSIS — I4819 Other persistent atrial fibrillation: Secondary | ICD-10-CM

## 2014-04-19 DIAGNOSIS — Z7901 Long term (current) use of anticoagulants: Secondary | ICD-10-CM | POA: Diagnosis not present

## 2014-04-19 DIAGNOSIS — R791 Abnormal coagulation profile: Secondary | ICD-10-CM | POA: Diagnosis not present

## 2014-04-19 DIAGNOSIS — I429 Cardiomyopathy, unspecified: Secondary | ICD-10-CM | POA: Diagnosis present

## 2014-04-19 DIAGNOSIS — N179 Acute kidney failure, unspecified: Secondary | ICD-10-CM | POA: Diagnosis present

## 2014-04-19 DIAGNOSIS — I110 Hypertensive heart disease with heart failure: Secondary | ICD-10-CM | POA: Diagnosis present

## 2014-04-19 DIAGNOSIS — J90 Pleural effusion, not elsewhere classified: Secondary | ICD-10-CM | POA: Diagnosis not present

## 2014-04-19 DIAGNOSIS — I509 Heart failure, unspecified: Secondary | ICD-10-CM | POA: Insufficient documentation

## 2014-04-19 DIAGNOSIS — R0602 Shortness of breath: Secondary | ICD-10-CM | POA: Diagnosis not present

## 2014-04-19 DIAGNOSIS — R609 Edema, unspecified: Secondary | ICD-10-CM

## 2014-04-19 DIAGNOSIS — I481 Persistent atrial fibrillation: Secondary | ICD-10-CM | POA: Diagnosis not present

## 2014-04-19 DIAGNOSIS — E039 Hypothyroidism, unspecified: Secondary | ICD-10-CM | POA: Diagnosis present

## 2014-04-19 DIAGNOSIS — J811 Chronic pulmonary edema: Secondary | ICD-10-CM | POA: Diagnosis not present

## 2014-04-19 HISTORY — DX: Heart failure, unspecified: I50.9

## 2014-04-19 HISTORY — DX: Long term (current) use of anticoagulants: Z79.01

## 2014-04-19 LAB — I-STAT TROPONIN, ED: Troponin i, poc: 0 ng/mL (ref 0.00–0.08)

## 2014-04-19 LAB — URINALYSIS, ROUTINE W REFLEX MICROSCOPIC
BILIRUBIN URINE: NEGATIVE
Glucose, UA: NEGATIVE mg/dL
Ketones, ur: NEGATIVE mg/dL
LEUKOCYTES UA: NEGATIVE
NITRITE: NEGATIVE
PH: 5 (ref 5.0–8.0)
Protein, ur: NEGATIVE mg/dL
SPECIFIC GRAVITY, URINE: 1.01 (ref 1.005–1.030)
Urobilinogen, UA: 0.2 mg/dL (ref 0.0–1.0)

## 2014-04-19 LAB — COMPREHENSIVE METABOLIC PANEL
ALBUMIN: 3.3 g/dL — AB (ref 3.5–5.2)
ALK PHOS: 64 U/L (ref 39–117)
ALT: 62 U/L — ABNORMAL HIGH (ref 0–35)
AST: 68 U/L — ABNORMAL HIGH (ref 0–37)
Anion gap: 14 (ref 5–15)
BUN: 23 mg/dL (ref 6–23)
CALCIUM: 9.5 mg/dL (ref 8.4–10.5)
CO2: 21 mEq/L (ref 19–32)
CREATININE: 1.33 mg/dL — AB (ref 0.50–1.10)
Chloride: 96 mEq/L (ref 96–112)
GFR calc non Af Amer: 37 mL/min — ABNORMAL LOW (ref >90)
GFR, EST AFRICAN AMERICAN: 43 mL/min — AB (ref >90)
GLUCOSE: 92 mg/dL (ref 70–99)
POTASSIUM: 4.4 meq/L (ref 3.7–5.3)
Sodium: 131 mEq/L — ABNORMAL LOW (ref 137–147)
TOTAL PROTEIN: 6.4 g/dL (ref 6.0–8.3)
Total Bilirubin: 0.6 mg/dL (ref 0.3–1.2)

## 2014-04-19 LAB — CBC
HEMATOCRIT: 38.4 % (ref 36.0–46.0)
HEMOGLOBIN: 12.9 g/dL (ref 12.0–15.0)
MCH: 29.7 pg (ref 26.0–34.0)
MCHC: 33.6 g/dL (ref 30.0–36.0)
MCV: 88.5 fL (ref 78.0–100.0)
Platelets: 235 10*3/uL (ref 150–400)
RBC: 4.34 MIL/uL (ref 3.87–5.11)
RDW: 14.4 % (ref 11.5–15.5)
WBC: 6.3 10*3/uL (ref 4.0–10.5)

## 2014-04-19 LAB — URINE MICROSCOPIC-ADD ON

## 2014-04-19 LAB — TSH: TSH: 5.97 u[IU]/mL — ABNORMAL HIGH (ref 0.350–4.500)

## 2014-04-19 LAB — TROPONIN I

## 2014-04-19 LAB — PRO B NATRIURETIC PEPTIDE: Pro B Natriuretic peptide (BNP): 9058 pg/mL — ABNORMAL HIGH (ref 0–450)

## 2014-04-19 MED ORDER — VITAMIN D3 25 MCG (1000 UNIT) PO TABS
2000.0000 [IU] | ORAL_TABLET | Freq: Every day | ORAL | Status: DC
  Administered 2014-04-19 – 2014-04-22 (×4): 2000 [IU] via ORAL
  Filled 2014-04-19 (×5): qty 2

## 2014-04-19 MED ORDER — METOPROLOL TARTRATE 25 MG PO TABS: 25.0000 mg | ORAL_TABLET | Freq: Two times a day (BID) | ORAL | Status: DC

## 2014-04-19 MED ORDER — ACETAMINOPHEN 500 MG PO TABS
500.0000 mg | ORAL_TABLET | Freq: Two times a day (BID) | ORAL | Status: DC | PRN
  Administered 2014-04-20 – 2014-04-21 (×3): 500 mg via ORAL
  Filled 2014-04-19 (×4): qty 1

## 2014-04-19 MED ORDER — OXYBUTYNIN CHLORIDE 5 MG PO TABS
5.0000 mg | ORAL_TABLET | Freq: Two times a day (BID) | ORAL | Status: DC
  Administered 2014-04-19 – 2014-04-22 (×6): 5 mg via ORAL
  Filled 2014-04-19 (×7): qty 1

## 2014-04-19 MED ORDER — APIXABAN 5 MG PO TABS
5.0000 mg | ORAL_TABLET | Freq: Two times a day (BID) | ORAL | Status: DC
  Administered 2014-04-19 – 2014-04-22 (×6): 5 mg via ORAL
  Filled 2014-04-19 (×8): qty 1

## 2014-04-19 MED ORDER — FUROSEMIDE 10 MG/ML IJ SOLN
40.0000 mg | Freq: Two times a day (BID) | INTRAMUSCULAR | Status: DC
  Administered 2014-04-19 – 2014-04-22 (×6): 40 mg via INTRAVENOUS
  Filled 2014-04-19 (×7): qty 4

## 2014-04-19 MED ORDER — POTASSIUM CHLORIDE CRYS ER 20 MEQ PO TBCR
20.0000 meq | EXTENDED_RELEASE_TABLET | Freq: Two times a day (BID) | ORAL | Status: DC
  Administered 2014-04-19 – 2014-04-22 (×6): 20 meq via ORAL
  Filled 2014-04-19 (×8): qty 1

## 2014-04-19 MED ORDER — ALPRAZOLAM 0.5 MG PO TABS
0.2500 mg | ORAL_TABLET | Freq: Once | ORAL | Status: AC
  Administered 2014-04-19: 0.25 mg via ORAL
  Filled 2014-04-19: qty 1

## 2014-04-19 MED ORDER — LEVOTHYROXINE SODIUM 50 MCG PO TABS
50.0000 ug | ORAL_TABLET | Freq: Every day | ORAL | Status: DC
  Administered 2014-04-20 – 2014-04-22 (×3): 50 ug via ORAL
  Filled 2014-04-19 (×4): qty 1

## 2014-04-19 MED ORDER — FUROSEMIDE 10 MG/ML IJ SOLN
40.0000 mg | Freq: Once | INTRAMUSCULAR | Status: AC
  Administered 2014-04-19: 40 mg via INTRAVENOUS
  Filled 2014-04-19: qty 4

## 2014-04-19 MED ORDER — ALPRAZOLAM 0.25 MG PO TABS
0.1250 mg | ORAL_TABLET | Freq: Four times a day (QID) | ORAL | Status: DC | PRN
  Administered 2014-04-20 – 2014-04-22 (×5): 0.25 mg via ORAL
  Filled 2014-04-19 (×5): qty 1

## 2014-04-19 MED ORDER — ADULT MULTIVITAMIN W/MINERALS CH
1.0000 | ORAL_TABLET | Freq: Every day | ORAL | Status: DC
  Administered 2014-04-20 – 2014-04-22 (×3): 1 via ORAL
  Filled 2014-04-19 (×3): qty 1

## 2014-04-19 MED ORDER — FLORA-Q PO CAPS
1.0000 | ORAL_CAPSULE | Freq: Every day | ORAL | Status: DC
  Administered 2014-04-20 – 2014-04-22 (×3): 1 via ORAL
  Filled 2014-04-19 (×4): qty 1

## 2014-04-19 MED ORDER — MEMANTINE HCL ER 28 MG PO CP24
28.0000 mg | ORAL_CAPSULE | Freq: Every day | ORAL | Status: DC
  Administered 2014-04-19 – 2014-04-21 (×3): 28 mg via ORAL
  Filled 2014-04-19 (×5): qty 28

## 2014-04-19 MED ORDER — VITAMIN D 50 MCG (2000 UT) PO TABS: 2000.0000 [IU] | ORAL_TABLET | Freq: Every day | ORAL | Status: DC

## 2014-04-19 MED ORDER — MELATONIN 3 MG PO TABS: 3.0000 mg | ORAL_TABLET | Freq: Every day | ORAL | Status: DC

## 2014-04-19 MED ORDER — RIVASTIGMINE 9.5 MG/24HR TD PT24
9.5000 mg | MEDICATED_PATCH | Freq: Every day | TRANSDERMAL | Status: DC
  Administered 2014-04-19: 9.5 mg via TRANSDERMAL
  Filled 2014-04-19 (×2): qty 1

## 2014-04-19 MED ORDER — METOPROLOL TARTRATE 25 MG PO TABS
37.5000 mg | ORAL_TABLET | Freq: Two times a day (BID) | ORAL | Status: DC
  Administered 2014-04-19 – 2014-04-22 (×6): 37.5 mg via ORAL
  Filled 2014-04-19 (×8): qty 1

## 2014-04-19 NOTE — H&P (Signed)
History and Physical   Patient ID: [name] MRN: [id number], DOB/AGE: 02-11-1933 80 y.o. Date of Encounter: 04/19/2014  Primary Physician: Mathews Argyle, MD Primary Cardiologist: Wynonia Lawman  Chief Complaint:  Chest pain  HPI: [name] is a 80 y.o. female with a history of atrial fibrillation on Eliquis and metoprolol, she failed Tikosyn. She wore an event monitor in September, because of palpitations associated with SOB. The son states he was told there was nothing of concern on it.   Ms Cali's memory is poor. Her son is here and provides information. She was having pedal edema starting last week and saw her primary MD on 10/12. She was put on Lasix 20 mg daily, had it last pm and this am. Last pm and this am, she was feeling better, although there was no noticeable increase in her urine output. However, by 10 am, she felt very bad. She was not able to describe any particular problem. She does mention a feeling of chest discomfort. Her description is vague, and may be more consistent with palpitations than actual chest discomfort. She is not able to remember if she gets chest discomfort with ambulation. She is short of breath at rest, but cannot remember how long this has been the case.   Past Medical History  Diagnosis Date  . Hypertension   . Atrial fibrillation   . Dementia     mild  . Heart murmur   . Arthritis   . Acute congestive heart failure 04/19/2014  . Chronic anticoagulation     Surgical History:  Past Surgical History  Procedure Laterality Date  . Total hip arthroplasty Bilateral     Left in 2007, right in 2001  . Colonoscopy  2002    Remote history of polypectomy    I have reviewed the patient's current medications. Prior to Admission medications   Medication Sig Start Date End Date Taking? Authorizing Provider  acetaminophen (TYLENOL) 500 MG tablet Take 500 mg by mouth 2 (two) times daily as needed for moderate pain.   Yes  Historical Provider, MD  ALPRAZolam (XANAX) 0.25 MG tablet Take 0.125 mg by mouth 2 (two) times daily as needed for anxiety.   Yes Historical Provider, MD  apixaban (ELIQUIS) 5 MG TABS tablet Take 5 mg by mouth 2 (two) times daily.   Yes Historical Provider, MD  Calcium Carbonate-Vitamin D (CALCIUM + D PO) Take 1 tablet by mouth 2 (two) times daily.   Yes Historical Provider, MD  Cholecalciferol (VITAMIN D) 2000 UNITS tablet Take 2,000 Units by mouth daily.   Yes Historical Provider, MD  furosemide (LASIX) 20 MG tablet Take 20 mg by mouth daily.   Yes Historical Provider, MD  levothyroxine (SYNTHROID, LEVOTHROID) 50 MCG tablet Take 50 mcg by mouth daily before breakfast.   Yes Historical Provider, MD  losartan (COZAAR) 100 MG tablet Take 100 mg by mouth daily.   Yes Historical Provider, MD  Melatonin 3 MG TABS Take 3 mg by mouth at bedtime as needed (for sleep).   Yes Historical Provider, MD  Memantine HCl ER (NAMENDA XR) 28 MG CP24 Take 28 mg by mouth at bedtime.   Yes Historical Provider, MD  metoprolol tartrate (LOPRESSOR) 25 MG tablet Take 25 mg by mouth 2 (two) times daily.   Yes Historical Provider, MD  Multiple Vitamin (MULTIVITAMIN WITH MINERALS) TABS Take 1 tablet by mouth daily.   Yes Historical Provider, MD  Probiotic Product (PROBIOTIC DAILY PO) Take 1 tablet  by mouth daily.   Yes Historical Provider, MD  rivastigmine (EXELON) 9.5 mg/24hr Place 1 patch onto the skin daily.   Yes Historical Provider, MD   Scheduled Meds: Continuous Infusions: PRN Meds:.  Allergies:  Allergies  Allergen Reactions  . Dilaudid [Hydromorphone Hcl] Other (See Comments)    hallucinations  . Vicodin [Hydrocodone-Acetaminophen] Other (See Comments)    hallucinations   History   Social History  . Marital Status: Divorced    Spouse Name: N/A    Number of Children: N/A  . Years of Education: N/A   Occupational History  . Retired    Social History Main Topics  . Smoking status: Never Smoker   .  Smokeless tobacco: Never Used  . Alcohol Use: No  . Drug Use: No  . Sexual Activity:    Other Topics Concern  . Not on file   Social History Narrative   She carries the hemophilia gene, her mother and her son both have disease.    Family History  Problem Relation Age of Onset  . Pneumonia Mother 1   Family Status  Relation Status Death Age  . Father Deceased 41    Bleeding ulcers, History of hemophilia  . Mother Deceased 84    Pneumonia    Review of Systems:   Full 14-point review of systems otherwise negative except as noted above.  Physical Exam: Blood pressure 143/93, pulse 82, temperature 97.8 F (36.6 C), resp. rate 19, SpO2 95.00%. General: Well developed, well nourished,female in no acute distress. Head: Normocephalic, atraumatic, sclera non-icteric, no xanthomas, nares are without discharge. Dentition: Poor Neck: No carotid bruits. JVD elevated to jaw. No thyromegally Lungs: Good expansion bilaterally. without wheezes or rhonchi. Bibasilar Rales Heart: IRRegular rate and rhythm with S1 S2.  No S3 or S4.  No murmur, no rubs, or gallops appreciated. Abdomen: Soft, non-tender, non-distended with normoactive bowel sounds. No hepatomegaly. No rebound/guarding. No obvious abdominal masses. Msk:  Strength and tone appear weak for age. No joint deformities or effusions, no spine or costo-vertebral angle tenderness. Extremities: No clubbing or cyanosis. 1+ lower extremity edema.  Distal pedal pulses are 2+ in 4 extrem, but difficult to palpate in both feet secondary to edema Neuro: Alert and oriented X 2. Moves all extremities spontaneously. No focal deficits noted. Psych:  Responds to questions appropriately with a normal affect. Skin: No rashes or lesions noted  Labs:   Lab Results  Component Value Date   WBC 6.3 04/19/2014   HGB 12.9 04/19/2014   HCT 38.4 04/19/2014   MCV 88.5 04/19/2014   PLT 235 04/19/2014     Recent Labs Lab 04/19/14 1222  NA 131*  K 4.4    CL 96  CO2 21  BUN 23  CREATININE 1.33*  CALCIUM 9.5  PROT 6.4  BILITOT 0.6  ALKPHOS 64  ALT 62*  AST 68*  GLUCOSE 92    Recent Labs  04/19/14 1244  TROPIPOC 0.00   Pro B Natriuretic peptide (BNP)  Date/Time Value Ref Range Status  04/19/2014  1:47 PM 9058.0* 0 - 450 pg/mL Final    Radiology/Studies: Dg Chest 2 View 04/19/2014   CLINICAL DATA:  Heart palpitations.  Swelling .  EXAM: CHEST  2 VIEW  COMPARISON:  11/05/2012.  FINDINGS: Mediastinum and hilar structures normal. Cardiomegaly with pulmonary vascular prominence and bilateral interstitial prominence and bilateral pleural effusions noted. These findings are consistent with congestive heart failure. No acute bony abnormality.  IMPRESSION: Congestive heart failure with pulmonary edema  and bilateral pleural effusions.   Electronically Signed   By: Marcello Moores  Register   On: 04/19/2014 13:26   Echo: 08/02/2012 Study Conclusions - Left ventricle: The cavity size was normal. Wall thickness was increased in a pattern of moderate LVH. Systolic function was normal. Wall motion was normal; there were no regional wall motion abnormalities. Doppler parameters are consistent with abnormal left ventricular relaxation (grade 1 diastolic dysfunction). - Aortic valve: Mild regurgitation. - Mitral valve: Moderate regurgitation directed posteriorly. - Right atrium: The atrium was mildly dilated. - Pulmonary arteries: PA peak pressure: 57mm Hg (S).  ECG: 04/19/2014 Atrial fibrillation Vent. rate 99 BPM PR interval * ms QRS duration 150 ms QT/QTc 385/494 ms P-R-T axes -1 -85 92  ASSESSMENT AND PLAN:  Principal Problem:   Acute congestive heart failure - admit, diurese, insert Foley catheter, track intake/output and daily weights. Follow renal function carefully. Supplement potassium as needed. Check echocardiogram and follow up on results. Lasix 40 mg IV BID  Active Problems:   HTN (hypertension) - follow with diuresis, continue  home medications    Dementia - continue Exelon patch and other home meds. She will have problems with being in an unfamiliar environment, may need a sitter.    Chronic anticoagulation - continue Eliquis for now    Chest pain - If she has left ventricular dysfunction on echo, or significantly elevated cardiac enzymes, may need ischemic evaluation. Otherwise, would have her follow up with Dr. Wynonia Lawman and have him decide further evaluation.   Jonetta Speak, PA-C 04/19/2014 5:36 PM Beeper [phone]  Patient seen and examined. Agree with assessment and plan.  Ms. Lubinski is a 80 year old female, who is followed by Dr. Tollie Eth for cardiology care.  She has a history of atrial fibrillation, which now is permanent and is on Eliquis, and metoprolol.  Previously, she had been on Tikosyn, and remotely had been on warfarin.  She does have a history of dementia and has had issues in the past with sundowning while hospitalized.  Recently she has experienced increasing episodes of shortness of breath and leg swelling.  She has seen her primary physician yesterday and was given Lasix 20 mg without significant diuresis.  Because of not feeling any better despite diuretic initiation.  She presented to the emergency room for evaluation.  Presently, she denies chest pain.  Her ventricular rate is approximately 100 beats per minute in atrial fibrillation.  Her pro BNP is markedly elevated at 9058.  A remote echo in January 2014 demonstrated moderate left ventricular hypertrophy with normal systolic function, and grade 1 diastolic dysfunction.  There was evidence for mild aortic insufficiency and moderate mitral regurgitation directed posteriorly.  She had mild pulmonary hypertension with estimated PA pressure 36 mm.   We will admit her for acute on chronic heart failure, probably diastolic in etiology.  She will be started on IV furosemide, initially at 40 mg twice a day with close followup of renal  function.  An echo Doppler study will be obtained.  She is on Eliquis for anticoagulation.  A Foley catheter was placed and she complains of bladder spasm, and she'll be treated with low-dose Ditropan.  Following hospital stabilization, she will return to the care of Dr. Wynonia Lawman.   Troy Sine, MD, [facility] 04/19/2014  6:19 PM

## 2014-04-19 NOTE — ED Provider Notes (Signed)
CSN: [id number]     Arrival date & time 04/19/14  1208 History   First MD Initiated Contact with Patient 04/19/14 1255     Chief Complaint  Patient presents with  . Leg Swelling  . Chest Pain     (Consider location/radiation/quality/duration/timing/severity/associated sxs/prior Treatment) HPI [name] is a 80 y.o. female with a history of A. fib and dementia comes in for evaluation of leg swelling and fatigue. Patient is accompanied by her son who gives the history of present illness. Son states her cardiologist recently started her on 20 of furosemide, they gave the first dose last night and again this morning at 8:00. Son was concerned urine output was not as much as usual and her legs still appear to be swollen. Reports f/u Cardiology appt in 2 weeks. Denies fevers, chest pain, acute shortness of breath, numbness or weakness. Cardiologist is Dr. Wynonia Lawman  Past Medical History  Diagnosis Date  . Hypertension   . Atrial fibrillation   . Dementia     mild  . Heart murmur   . Arthritis    Past Surgical History  Procedure Laterality Date  . Total hip arthroplasty     No family history on file. History  Substance Use Topics  . Smoking status: Never Smoker   . Smokeless tobacco: Never Used  . Alcohol Use: No   OB History   Grav Para Term Preterm Abortions TAB SAB Ect Mult Living                 Review of Systems  Unable to perform ROS     Allergies  Dilaudid and Vicodin  Home Medications   Prior to Admission medications   Medication Sig Start Date End Date Taking? Authorizing Provider  acetaminophen (TYLENOL) 500 MG tablet Take 500 mg by mouth 2 (two) times daily as needed for moderate pain.   Yes Historical Provider, MD  ALPRAZolam (XANAX) 0.25 MG tablet Take 0.125 mg by mouth 2 (two) times daily as needed for anxiety.   Yes Historical Provider, MD  apixaban (ELIQUIS) 5 MG TABS tablet Take 5 mg by mouth 2 (two) times daily.   Yes Historical Provider, MD   Calcium Carbonate-Vitamin D (CALCIUM + D PO) Take 1 tablet by mouth 2 (two) times daily.   Yes Historical Provider, MD  Cholecalciferol (VITAMIN D) 2000 UNITS tablet Take 2,000 Units by mouth daily.   Yes Historical Provider, MD  furosemide (LASIX) 20 MG tablet Take 20 mg by mouth daily.   Yes Historical Provider, MD  levothyroxine (SYNTHROID, LEVOTHROID) 50 MCG tablet Take 50 mcg by mouth daily before breakfast.   Yes Historical Provider, MD  losartan (COZAAR) 100 MG tablet Take 100 mg by mouth daily.   Yes Historical Provider, MD  Melatonin 3 MG TABS Take 3 mg by mouth at bedtime as needed (for sleep).   Yes Historical Provider, MD  Memantine HCl ER (NAMENDA XR) 28 MG CP24 Take 28 mg by mouth at bedtime.   Yes Historical Provider, MD  metoprolol tartrate (LOPRESSOR) 25 MG tablet Take 25 mg by mouth 2 (two) times daily.   Yes Historical Provider, MD  Multiple Vitamin (MULTIVITAMIN WITH MINERALS) TABS Take 1 tablet by mouth daily.   Yes Historical Provider, MD  Probiotic Product (PROBIOTIC DAILY PO) Take 1 tablet by mouth daily.   Yes Historical Provider, MD  rivastigmine (EXELON) 9.5 mg/24hr Place 1 patch onto the skin daily.   Yes Historical Provider, MD   BP 138/105  Pulse 101  Temp(Src) 97.8 F (36.6 C)  Resp 16  SpO2 98% Physical Exam  Nursing note and vitals reviewed. Constitutional: She is oriented to person, place, and time. She appears well-developed and well-nourished.  HENT:  Head: Normocephalic and atraumatic.  Mouth/Throat: Oropharynx is clear and moist.  Eyes: Conjunctivae are normal. Pupils are equal, round, and reactive to light. Right eye exhibits no discharge. Left eye exhibits no discharge. No scleral icterus.  Neck: Neck supple.  Cardiovascular:  Irregularly irregular rhythm.  Pulmonary/Chest: Effort normal and breath sounds normal. No respiratory distress. She has no wheezes. She has no rales.  Abdominal: Soft. There is no tenderness.  Musculoskeletal: She  exhibits no tenderness.  2+ pitting edema bilaterally to the proximal tibia  Neurological: She is alert and oriented to person, place, and time.  Cranial Nerves II-XII grossly intact  Skin: Skin is warm and dry. No rash noted.  Psychiatric: She has a normal mood and affect.    ED Course  Procedures (including critical care time) Labs Review Labs Reviewed  COMPREHENSIVE METABOLIC PANEL - Abnormal; Notable for the following:    Sodium 131 (*)    Creatinine, Ser 1.33 (*)    Albumin 3.3 (*)    AST 68 (*)    ALT 62 (*)    GFR calc non Af Amer 37 (*)    GFR calc Af Amer 43 (*)    All other components within normal limits  CBC  URINALYSIS, ROUTINE W REFLEX MICROSCOPIC  PRO B NATRIURETIC PEPTIDE  I-STAT TROPOININ, ED    Imaging Review Dg Chest 2 View  04/19/2014   CLINICAL DATA:  Heart palpitations.  Swelling .  EXAM: CHEST  2 VIEW  COMPARISON:  11/05/2012.  FINDINGS: Mediastinum and hilar structures normal. Cardiomegaly with pulmonary vascular prominence and bilateral interstitial prominence and bilateral pleural effusions noted. These findings are consistent with congestive heart failure. No acute bony abnormality.  IMPRESSION: Congestive heart failure with pulmonary edema and bilateral pleural effusions.   Electronically Signed   By: Marcello Moores  Register   On: 04/19/2014 13:26     EKG Interpretation   Date/Time:  Tuesday April 19 2014 12:28:42 EDT Ventricular Rate:  80 PR Interval:    QRS Duration: 144 QT Interval:  412 QTC Calculation: 475 R Axis:   -83 Text Interpretation:  Atrial fibrillation Left axis deviation Right bundle  branch block Left ventricular hypertrophy with QRS widening Anterior  infarct , age undetermined Abnormal ECG Atrial fibrillation Artifact Left  ventricular hypertrophy Non-specific intra-ventricular conduction delay  Abnormal ekg Confirmed by Carmin Muskrat  MD ([id number]) on 04/19/2014  12:54:01 PM      MDM  Vitals stable - WNL -afebrile. Patient  not tachycardic on my exam Pt resting comfortably in ED.  BNP 9058, troponin negative, PE--not SOB, 2+ pitting edema to proximal tibia bilaterally. Chest x-ray shows congestive heart failure with pulmonary edema and bilateral pleural effusions. 40 Lasix given in ED. Pt tolerated well. No evidence of UTI. Cardiology recommends admission. Discussed potential for admission with patient and son. Both amenable to plan.     Prior to patient admission, I discussed and reviewed this case with Dr. Vanita Panda    Final diagnoses:  None       Verl Dicker, PA-C 04/22/14 [phone]

## 2014-04-19 NOTE — ED Provider Notes (Signed)
Discussed case with Lenna Gilford, PA-C. Transfer of care from [facility], PA-C at change in shift.   Plan: Cardiology to see.  Cardiology to admit patient for observation as per note. Patient to be admitted for acute on chronic heart failure.   Jamse Mead, PA-C 04/19/14 1909

## 2014-04-19 NOTE — ED Notes (Signed)
lower leg swelling since last week having palpitations has afib freq has increased

## 2014-04-19 NOTE — ED Notes (Addendum)
Pt ambulated to restroom without distress with RN assist.

## 2014-04-19 NOTE — ED Provider Notes (Signed)
Medical screening examination/treatment/procedure(s) were performed by non-physician practitioner and as supervising physician I was immediately available for consultation/collaboration.   EKG Interpretation   Date/Time:  Tuesday April 19 2014 12:28:42 EDT Ventricular Rate:  80 PR Interval:    QRS Duration: 144 QT Interval:  412 QTC Calculation: 475 R Axis:   -83 Text Interpretation:  Atrial fibrillation Left axis deviation Right bundle  branch block Left ventricular hypertrophy with QRS widening Anterior  infarct , age undetermined Abnormal ECG Atrial fibrillation Artifact Left  ventricular hypertrophy Non-specific intra-ventricular conduction delay  Abnormal ekg Confirmed by Carmin Muskrat  MD ([id number]) on 04/19/2014  12:54:01 PM        Malvin Johns, MD 04/19/14 1944

## 2014-04-19 NOTE — ED Notes (Signed)
Attempted report 

## 2014-04-20 DIAGNOSIS — I319 Disease of pericardium, unspecified: Secondary | ICD-10-CM

## 2014-04-20 DIAGNOSIS — I481 Persistent atrial fibrillation: Secondary | ICD-10-CM

## 2014-04-20 LAB — TROPONIN I
Troponin I: <0.3 ng/mL (ref <0.30)
Troponin I: <0.3 ng/mL (ref <0.30)

## 2014-04-20 LAB — BASIC METABOLIC PANEL
Anion gap: 15 (ref 5–15)
BUN: 26 mg/dL — AB (ref 6–23)
CO2: 24 mEq/L (ref 19–32)
Calcium: 9.1 mg/dL (ref 8.4–10.5)
Chloride: 96 mEq/L (ref 96–112)
Creatinine, Ser: 1.43 mg/dL — ABNORMAL HIGH (ref 0.50–1.10)
GFR calc Af Amer: 39 mL/min — ABNORMAL LOW (ref >90)
GFR calc non Af Amer: 34 mL/min — ABNORMAL LOW (ref >90)
GLUCOSE: 100 mg/dL — AB (ref 70–99)
POTASSIUM: 4.1 meq/L (ref 3.7–5.3)
Sodium: 135 mEq/L — ABNORMAL LOW (ref 137–147)

## 2014-04-20 LAB — T4, FREE: FREE T4: 1.71 ng/dL (ref 0.80–1.80)

## 2014-04-20 LAB — MAGNESIUM: Magnesium: 1.7 mg/dL (ref 1.5–2.5)

## 2014-04-20 MED ORDER — RIVASTIGMINE 9.5 MG/24HR TD PT24
9.5000 mg | MEDICATED_PATCH | Freq: Every day | TRANSDERMAL | Status: DC
  Administered 2014-04-20 – 2014-04-21 (×2): 9.5 mg via TRANSDERMAL
  Filled 2014-04-20 (×3): qty 1

## 2014-04-20 MED ORDER — ENSURE PUDDING PO PUDG
1.0000 | ORAL | Status: DC
  Administered 2014-04-20 – 2014-04-21 (×2): 1 via ORAL

## 2014-04-20 NOTE — Progress Notes (Signed)
Echocardiogram 2D Echocardiogram has been performed.  [name] 04/20/2014, 2:17 PM

## 2014-04-20 NOTE — Care Management Note (Addendum)
    Page 1 of 1   04/22/2014     11:51:03 AM CARE MANAGEMENT NOTE 04/22/2014  Patient:  [name], [name]   Account Number:  [id number]  Date Initiated:  04/20/2014  Documentation initiated by:  [facility]  Subjective/Objective Assessment:   80 yo female admitted for  Acute congestive heart failure.//Home with spouse; son at bedside.     Action/Plan:   admit, diurese, insert Foley catheter, track intake/output and daily weights. Follow renal function carefully. Supplement potassium as needed. Check echocardiogram and follow up on results. //Access for disposition needs.   Anticipated DC Date:  04/23/2014   Anticipated DC Plan:  [place]  CM consult      Choice offered to / List presented to:             Status of service:  In process, will continue to follow Medicare Important Message given?  YES (If response is "NO", the following Medicare IM given date fields will be blank) Date Medicare IM given:  04/22/2014 Medicare IM given by:  [facility] Date Additional Medicare IM given:   Additional Medicare IM given by:    Discharge Disposition:    Per UR Regulation:  Reviewed for med. necessity/level of care/duration of stay  If discussed at [place] of Stay Meetings, dates discussed:    Comments:

## 2014-04-20 NOTE — Progress Notes (Signed)
Patient Name: [name] Date of Encounter: 04/20/2014  Primary Cardiologist: Dr. Wynonia Lawman   Principal Problem:   Acute congestive heart failure Active Problems:   HTN (hypertension)   Dementia   Chronic anticoagulation    SUBJECTIVE  Per son, no CP, some SOB yesterday. Patient has severe memory problem and does not remember a lot of home symptom.  CURRENT MEDS . apixaban  5 mg Oral BID  . cholecalciferol  2,000 Units Oral Daily  . FLORA-Q  1 capsule Oral Daily  . furosemide  40 mg Intravenous BID  . levothyroxine  50 mcg Oral QAC breakfast  . Memantine HCl ER  28 mg Oral QHS  . metoprolol tartrate  37.5 mg Oral BID  . multivitamin with minerals  1 tablet Oral Daily  . oxybutynin  5 mg Oral BID  . potassium chloride  20 mEq Oral BID  . rivastigmine  9.5 mg Transdermal QHS    OBJECTIVE  Filed Vitals:   04/19/14 2010 04/20/14 0158 04/20/14 0525 04/20/14 1031  BP: 132/85 115/72 111/59 116/74  Pulse: 103 97 79 91  Temp: 98.1 F (36.7 C) 98 F (36.7 C) 98.1 F (36.7 C)   TempSrc: Oral Oral Oral   Resp: 18 18 18    Height:      Weight:   138 lb 10.3 oz (62.887 kg)   SpO2: 100% 95% 93% 98%    Intake/Output Summary (Last 24 hours) at 04/20/14 1230 Last data filed at 04/20/14 1113  Gross per 24 hour  Intake   1820 ml  Output    725 ml  Net   1095 ml   Filed Weights   04/19/14 1938 04/20/14 0525  Weight: 140 lb 14 oz (63.9 kg) 138 lb 10.3 oz (62.887 kg)    PHYSICAL EXAM  General: Pleasant, NAD. Neuro: Alert and oriented X 3. Moves all extremities spontaneously. Psych: Normal affect. HEENT:  Normal  Neck: Supple without bruits. Lungs:  Resp regular and unlabored, CTA, except with lower basilar rale Heart: irregular no s3, s4, or murmurs. Abdomen: Soft, non-tender, non-distended, BS + x 4.  Extremities: No clubbing, cyanosis. DP/PT/Radials 2+ and equal bilaterally. 1+ pretibial edema, 2+ pitting petal edema  Accessory Clinical  Findings  CBC  Recent Labs  04/19/14 1222  WBC 6.3  HGB 12.9  HCT 38.4  MCV 88.5  PLT 017   Basic Metabolic Panel  Recent Labs  04/19/14 1222 04/20/14 0227  NA 131* 135*  K 4.4 4.1  CL 96 96  CO2 21 24  GLUCOSE 92 100*  BUN 23 26*  CREATININE 1.33* 1.43*  CALCIUM 9.5 9.1  MG  --  1.7   Liver Function Tests  Recent Labs  04/19/14 1222  AST 68*  ALT 62*  ALKPHOS 64  BILITOT 0.6  PROT 6.4  ALBUMIN 3.3*   Cardiac Enzymes  Recent Labs  04/19/14 2147 04/20/14 0227 04/20/14 0815  TROPONINI <0.30 <0.30 <0.30   Thyroid Function Tests  Recent Labs  04/19/14 2147  TSH 5.970*    TELE NSR with HR 70-80s, no significant ventricular ectopy    ECG  A-fib with RBBB  Echocardiogram 08/02/2012  - Left ventricle: The cavity size was normal. Wall thickness was increased in a pattern of moderate LVH. Systolic function was normal. Wall motion was normal; there were no regional wall motion abnormalities. Doppler parameters are consistent with abnormal left ventricular relaxation (grade 1 diastolic dysfunction). - Aortic valve: Mild regurgitation. - Mitral valve: Moderate regurgitation  directed posteriorly. - Right atrium: The atrium was mildly dilated. - Pulmonary arteries: PA peak pressure: 88mm Hg (S).      Radiology/Studies  Dg Chest 2 View  04/19/2014   CLINICAL DATA:  Heart palpitations.  Swelling .  EXAM: CHEST  2 VIEW  COMPARISON:  11/05/2012.  FINDINGS: Mediastinum and hilar structures normal. Cardiomegaly with pulmonary vascular prominence and bilateral interstitial prominence and bilateral pleural effusions noted. These findings are consistent with congestive heart failure. No acute bony abnormality.  IMPRESSION: Congestive heart failure with pulmonary edema and bilateral pleural effusions.   Electronically Signed   By: [place]   On: 04/19/2014 13:26    ASSESSMENT AND PLAN  1. Acute on chronic distolic HF  - increasing pedal edema  (may be combination of CHF and hypothyroidism, no significant hypoalbuminemia, albumin 3.3, borderline low)  - pending echo to check LV function  - continue IV lasix, rale in left base  - may transition to PO lasix in 24 to 48 hrs  2. Chest pain  - in the setting of dyspnea with CHF  - trop neg, if no wall motion abnormality or significant LV dysfunction on echo, would plan for outpt workup by Dr. Wynonia Lawman  3. Dementia and memory loss 4. permanent a-fib on eliquis  - failed tikosyn 5. HTN 6. Hypothyroidism  - TSH high, check free T4  Ruben Im Pager: [phone]  Personally seen and examined. Agree with above. Continue with diuresis.  Candee Furbish, MD

## 2014-04-20 NOTE — Progress Notes (Signed)
Patient was up all night with the leg pain and cramping. Patient tried to get up with 1 assist and almost fell. After being placed back in bed she was given xanax and is now asleep. Will continue monitor.

## 2014-04-20 NOTE — Progress Notes (Signed)
Paged attending doctor to notify of patient stating that she has fluid in her ear and wanted to know if the doctor can order something for it. Awaiting return call at this time.

## 2014-04-20 NOTE — Progress Notes (Addendum)
INITIAL NUTRITION ASSESSMENT  DOCUMENTATION CODES Per approved criteria  -Not Applicable   INTERVENTION: Encourage PO intake Provide Ensure Pudding once daily RD to continue to monitor for PO adequacy  NUTRITION DIAGNOSIS: Unintentional weight loss related to heart failure and dementia as evidenced by 8% weight loss per patient's son.   Goal: Pt to meet >/= 90% of their estimated nutrition needs   Monitor:  PO intake, weight trend, labs, I/O's  Reason for Assessment: Malnutrition Screening Tool, score of 2  80 y.o. female  Admitting Dx: Acute congestive heart failure  ASSESSMENT: 80 y.o. female with a history of atrial fibrillation, HTN, and dementia. Recently she has experienced increasing episodes of shortness of breath and leg swelling. She has seen her primary physician and was given Lasix 20 mg without significant diuresis. Because of not feeling any better despite diuretic initiation, she presented to the emergency room.  Pt reports that her appetite is good and she was eating well PTA with 3 meals daily. Pt is unsure how much she usually weights- her son assisted in providing history and reports that patient usually weighs 130 lbs and recently unintentionally lost down to 119 lbs. Son confirms patient has a good appetite and eats well. After patient lost weight, her son tried offering Boost nutritional supplements and milk shakes but, patient didn't care for them much. Per nursing notes, pt has been eating 25-95% of meals since admission. RD encouraged general healthful PO intake. Encouraged patient to monitor her weight. If she continues to lose weight, recommended adding a snack, milk shake, or nutritional supplement daily.   Labs: low sodium, low GFR  Height: Ht Readings from Last 1 Encounters:  04/19/14 5' 6.93" (1.7 m)    Weight: Wt Readings from Last 1 Encounters:  04/20/14 138 lb 10.3 oz (62.887 kg)    Ideal Body Weight: 135 lbs  % Ideal Body Weight:  102%  Wt Readings from Last 10 Encounters:  04/20/14 138 lb 10.3 oz (62.887 kg)  08/01/12 140 lb 14 oz (63.9 kg)    Usual Body Weight: 130 lbs  % Usual Body Weight: 106%  BMI:  Body mass index is 21.76 kg/(m^2).  Estimated Nutritional Needs: Kcal: 1500-1700 Protein: 70-80 grams Fluid: 1.5-1.7 L/day  Skin: intact; +2 RLE and LLE edema  Diet Order: Sodium Restricted  EDUCATION NEEDS: -No education needs identified at this time   Intake/Output Summary (Last 24 hours) at 04/20/14 1617 Last data filed at 04/20/14 1437  Gross per 24 hour  Intake   1870 ml  Output   1775 ml  Net     95 ml    Last BM: 10/14  Labs:   Recent Labs Lab 04/19/14 1222 04/20/14 0227  NA 131* 135*  K 4.4 4.1  CL 96 96  CO2 21 24  BUN 23 26*  CREATININE 1.33* 1.43*  CALCIUM 9.5 9.1  MG  --  1.7  GLUCOSE 92 100*    CBG (last 3)  No results found for this basename: GLUCAP,  in the last 72 hours  Scheduled Meds: . apixaban  5 mg Oral BID  . cholecalciferol  2,000 Units Oral Daily  . FLORA-Q  1 capsule Oral Daily  . furosemide  40 mg Intravenous BID  . levothyroxine  50 mcg Oral QAC breakfast  . Memantine HCl ER  28 mg Oral QHS  . metoprolol tartrate  37.5 mg Oral BID  . multivitamin with minerals  1 tablet Oral Daily  . oxybutynin  5  mg Oral BID  . potassium chloride  20 mEq Oral BID  . rivastigmine  9.5 mg Transdermal QHS    Continuous Infusions:   Past Medical History  Diagnosis Date  . Hypertension   . Atrial fibrillation   . Dementia     mild  . Heart murmur   . Arthritis   . Acute congestive heart failure 04/19/2014  . Chronic anticoagulation     Past Surgical History  Procedure Laterality Date  . Total hip arthroplasty Bilateral     Left in 2007, right in 2001  . Colonoscopy  2002    Remote history of polypectomy    Pryor Ochoa RD, LDN Inpatient Clinical Dietitian Pager: [phone] After Hours Pager: [phone]

## 2014-04-20 NOTE — Progress Notes (Signed)
Patient complained of cramping and lower calf pain in bilateral legs. MD made aware and stated to do heating pads and tylenol. Will continue to monitor patient

## 2014-04-21 LAB — BASIC METABOLIC PANEL
ANION GAP: 15 (ref 5–15)
BUN: 21 mg/dL (ref 6–23)
CALCIUM: 9 mg/dL (ref 8.4–10.5)
CO2: 26 meq/L (ref 19–32)
CREATININE: 1.18 mg/dL — AB (ref 0.50–1.10)
Chloride: 96 mEq/L (ref 96–112)
GFR calc Af Amer: 49 mL/min — ABNORMAL LOW (ref >90)
GFR calc non Af Amer: 43 mL/min — ABNORMAL LOW (ref >90)
Glucose, Bld: 84 mg/dL (ref 70–99)
Potassium: 3.9 mEq/L (ref 3.7–5.3)
Sodium: 137 mEq/L (ref 137–147)

## 2014-04-21 MED ORDER — ACETAMINOPHEN 325 MG PO TABS
650.0000 mg | ORAL_TABLET | Freq: Four times a day (QID) | ORAL | Status: DC | PRN
  Administered 2014-04-22: 650 mg via ORAL
  Filled 2014-04-21: qty 2

## 2014-04-21 NOTE — Progress Notes (Signed)
Subjective:  Admitted with worsening CHF.  She has significant dementia and does not know why she is her.  Edema and breath is better  Objective:  Vital Signs in the last 24 hours: BP 120/62  Pulse 88  Temp(Src) 97.6 F (36.4 C) (Oral)  Resp 17  Ht 5' 6.93" (1.7 m)  Wt 57.199 kg (126 lb 1.6 oz)  BMI 19.79 kg/m2  SpO2 97%  Physical Exam: Pleasant but confused elderly WF in NAD Lungs:  Clear  Cardiac:  irregular rhythm, normal S1 and S2, no S3, 2/6 systolic murmur. Extremities:  trace edema present  Intake/Output from previous day: 10/14 0701 - 10/15 0700 In: 50 [P.O.:50] Out: 4625 [Urine:4625] Weight Filed Weights   04/19/14 1938 04/20/14 0525 04/21/14 0402  Weight: 63.9 kg (140 lb 14 oz) 62.887 kg (138 lb 10.3 oz) 57.199 kg (126 lb 1.6 oz)    Lab Results: Basic Metabolic Panel:  Recent Labs  04/20/14 0227 04/21/14 0702  NA 135* 137  K 4.1 3.9  CL 96 96  CO2 24 26  GLUCOSE 100* 84  BUN 26* 21  CREATININE 1.43* 1.18*    CBC:  Recent Labs  04/19/14 1222  WBC 6.3  HGB 12.9  HCT 38.4  MCV 88.5  PLT 235    BNP    Component Value Date/Time   PROBNP 9058.0* 04/19/2014 1347    PROTIME: Lab Results  Component Value Date   INR 1.47 08/04/2012   INR 2.10* 08/03/2012   INR 2.68* 08/02/2012    Telemetry:  atrial fib with controlled response  Assessment/Plan:  1. Acute systolic and  diastolic CHF  EF around 25% 2. Chronic a fib 3. Long term anticoagulation 4. Dementia  Rec:  Discussed with son.  She has been in afib since last December and has a very large left atrium.  Will diurese.  I do not think that attempts to maintain sinus will be helpful. Having a lot of sundowning and confusion.     Kerry Hough  MD [facility]  04/21/2014, 5:44 PM

## 2014-04-21 NOTE — Progress Notes (Signed)
Patient went bed around 2315 and awaken around midnight. Patient not able to remember surroundings and thought she was at home. Helped patient to remember that she was in the hospital and did not remember after 5secs. Patient wanted to get up and just "look outside" and therefore ambulated patient to the door and back to the bed. Xanax given to help calm patient and to help with insomnia. Currently patient is resting. Will continue to monitor patient to end of shift.

## 2014-04-21 NOTE — Plan of Care (Signed)
Problem: Phase I Progression Outcomes Goal: EF % per last Echo/documented,Core Reminder form on chart Outcome: Completed/Met Date Met:  04/21/14 EF performed on 04/20/2014 EF result 45-50% Goal: Voiding-avoid urinary catheter unless indicated Outcome: Progressing Patient still has a foley catheter for urine retention and diuresis

## 2014-04-21 NOTE — Progress Notes (Signed)
Paged on-call doctor for pain medication for patient continues to have leg cramps and the mustard packets and warm compress are not helping. Patient has used up the amount of tylenol she can have for the day but patient still has pain. Awaiting return page at this time.

## 2014-04-22 LAB — BASIC METABOLIC PANEL
Anion gap: 11 (ref 5–15)
BUN: 22 mg/dL (ref 6–23)
CALCIUM: 8.7 mg/dL (ref 8.4–10.5)
CO2: 28 meq/L (ref 19–32)
CREATININE: 1.19 mg/dL — AB (ref 0.50–1.10)
Chloride: 96 mEq/L (ref 96–112)
GFR calc Af Amer: 49 mL/min — ABNORMAL LOW (ref >90)
GFR calc non Af Amer: 42 mL/min — ABNORMAL LOW (ref >90)
Glucose, Bld: 92 mg/dL (ref 70–99)
Potassium: 4.3 mEq/L (ref 3.7–5.3)
Sodium: 135 mEq/L — ABNORMAL LOW (ref 137–147)

## 2014-04-22 MED ORDER — FUROSEMIDE 40 MG PO TABS: 40.0000 mg | ORAL_TABLET | Freq: Every day | ORAL | Status: DC

## 2014-04-22 MED ORDER — POTASSIUM CHLORIDE CRYS ER 20 MEQ PO TBCR: 20.0000 meq | EXTENDED_RELEASE_TABLET | Freq: Two times a day (BID) | ORAL | Status: DC

## 2014-04-22 MED ORDER — POTASSIUM CHLORIDE CRYS ER 20 MEQ PO TBCR: 20.0000 meq | EXTENDED_RELEASE_TABLET | Freq: Every day | ORAL | Status: DC

## 2014-04-22 NOTE — Discharge Summary (Signed)
Physician Discharge Summary  Patient ID: [name] MRN: [id number] DOB/AGE: [birth year] 80 y.o.  Admit date: 04/19/2014 Discharge date: 04/22/2014  Primary Physician: Dr. Lajean Manes  Primary Discharge Diagnosis:  1. Acute combined systolic and diastolic congestive heart failure-resolved  Secondary Discharge Diagnosis: 2. Mild cardiomyopathy 3. Moderate mitral regurgitation 4. Chronic atrial fibrillation 5. Long-term use of anticoagulation 6. Dementia 7. Acute renal failure-resolved 8. Hypertensive heart disease with diastolic dysfunction  Procedures:  Echocardiogram  Hospital Course: This 80 year old female has a history of moderate mitral regurgitation. She previously was in sinus rhythm but a year ago was hospitalized with congestive heart failure she subsequently improved following improvement in ejection fraction. She has a moderate amount of dementia and functions fairly well at home but becomes confused easily. She began to have peripheral edema last week and was placed on furosemide but was developing worsening shortness of breath and may have had some chest discomfort. She was edematous and was admitted to the hospital with congestive heart failure with elevated BNP. Chest x-ray was consistent with congestive heart failure.  She was placed on intravenous furosemide and diuresed from a weight of 140 pounds to 123 pounds on discharge. Output was 6 L negative. The hospital she had significant worsening of her dementia with sundowning in worsening confusion. After reviewing the old records it was felt in light of her marked left atrial enlargement the attempts and maintenance of sinus rhythm would not be beneficial as she had been in atrial fibrillation for almost a year. She was significantly improved and had no memory of what she came in to the hospital. Arrangements are made for her to receive home health care and she was discharged in improved condition. She is to have  close followup in the office. She is instructed to weigh daily and to limit salt consumption in the home. She'll be discharged on furosemide 40 mg daily and potassium 20 mEq daily. Follow up with me in one week.  Discharge Exam: Blood pressure 115/61, pulse 89, temperature 97.7 F (36.5 C), temperature source Oral, resp. rate 18, height 5' 6.93" (1.7 m), weight 55.838 kg (123 lb 1.6 oz), SpO2 96.00%. Weight: 55.838 kg (123 lb 1.6 oz) (Scal;e B) Irregular rhythm, normal Q5-Z5, 2/6 systolic murmur, trace edema, lungs clear  Labs: CBC:   Lab Results  Component Value Date   WBC 6.3 04/19/2014   HGB 12.9 04/19/2014   HCT 38.4 04/19/2014   MCV 88.5 04/19/2014   PLT 235 04/19/2014    CMP:  Recent Labs Lab 04/19/14 1222  04/22/14 0404  NA 131*  < > 135*  K 4.4  < > 4.3  CL 96  < > 96  CO2 21  < > 28  BUN 23  < > 22  CREATININE 1.33*  < > 1.19*  CALCIUM 9.5  < > 8.7  PROT 6.4  --   --   BILITOT 0.6  --   --   ALKPHOS 64  --   --   ALT 62*  --   --   AST 68*  --   --   GLUCOSE 92  < > 92  < > = values in this interval not displayed.  Cardiac Enzymes:  Recent Labs  04/19/14 2147 04/20/14 0227 04/20/14 0815  TROPONINI <0.30 <0.30 <0.30    BNP (last 3 results)  Recent Labs  04/19/14 1347  PROBNP 9058.0*    Thyroid: Lab Results  Component Value Date   TSH 5.970* 04/19/2014  Radiology: Congestive heart failure with cardiomegaly and bilateral pleural effusions  EKG: Atrial fibrillation, left bundle branch block  Discharge Medications:   Medication List         acetaminophen 500 MG tablet  Commonly known as:  TYLENOL  Take 500 mg by mouth 2 (two) times daily as needed for moderate pain.     ALPRAZolam 0.25 MG tablet  Commonly known as:  XANAX  Take 0.125 mg by mouth 2 (two) times daily as needed for anxiety.     apixaban 5 MG Tabs tablet  Commonly known as:  ELIQUIS  Take 5 mg by mouth 2 (two) times daily.     CALCIUM + D PO  Take 1 tablet by  mouth 2 (two) times daily.     furosemide 40 MG tablet  Commonly known as:  LASIX  Take 1 tablet (40 mg total) by mouth daily.     levothyroxine 50 MCG tablet  Commonly known as:  SYNTHROID, LEVOTHROID  Take 50 mcg by mouth daily before breakfast.     losartan 100 MG tablet  Commonly known as:  COZAAR  Take 100 mg by mouth daily.     Melatonin 3 MG Tabs  Take 3 mg by mouth at bedtime as needed (for sleep).     metoprolol tartrate 25 MG tablet  Commonly known as:  LOPRESSOR  Take 25 mg by mouth 2 (two) times daily.     multivitamin with minerals Tabs tablet  Take 1 tablet by mouth daily.     NAMENDA XR 28 MG Cp24  Generic drug:  Memantine HCl ER  Take 28 mg by mouth at bedtime.     potassium chloride SA 20 MEQ tablet  Commonly known as:  K-DUR,KLOR-CON  Take 1 tablet (20 mEq total) by mouth daily.     PROBIOTIC DAILY PO  Take 1 tablet by mouth daily.     rivastigmine 9.5 mg/24hr  Commonly known as:  EXELON  Place 1 patch onto the skin daily.     Vitamin D 2000 UNITS tablet  Take 2,000 Units by mouth daily.         Followup plans and appointments: Followup Dr. Wynonia Lawman in one week, she is also instructed to weigh daily and will have home health followup with the patient.  Time spent with patient to include physician time: 45 minutes  Signed: W. Doristine Church. MD [facility] 04/22/2014, 8:32 AM

## 2014-04-22 NOTE — Progress Notes (Signed)
Patient discharged home. Discharge instructions completed with patient ans son.

## 2014-04-23 NOTE — ED Provider Notes (Signed)
Medical screening examination/treatment/procedure(s) were conducted as a shared visit with non-physician practitioner(s) and myself.  I personally evaluated the patient during the encounter.  On my exam this patient was awake and alert, but had e/o worsening HF.  She received initial increase in Lasix in the ED with good urine output.  Given her RF, Hx, I discussed her case with cardiology and she was admitted for further E/M.    EKG Interpretation   Date/Time:  Tuesday April 19 2014 16:44:19 EDT Ventricular Rate:  99 PR Interval:    QRS Duration: 150 QT Interval:  385 QTC Calculation: 494 R Axis:   -85 Text Interpretation:  Atrial fibrillation IVCD, consider atypical RBBB LVH  with IVCD, LAD and secondary repol abnrm Inferior infarct, acute ED  PHYSICIAN INTERPRETATION AVAILABLE IN [place] Confirmed by TEST,  Record ([id number]) on 04/21/2014 7:05:17 AM        Carmin Muskrat, MD 04/23/14 [phone]

## 2014-04-28 DIAGNOSIS — I5041 Acute combined systolic (congestive) and diastolic (congestive) heart failure: Secondary | ICD-10-CM | POA: Diagnosis not present

## 2014-04-28 DIAGNOSIS — I482 Chronic atrial fibrillation: Secondary | ICD-10-CM | POA: Diagnosis not present

## 2014-04-28 DIAGNOSIS — Q233 Congenital mitral insufficiency: Secondary | ICD-10-CM | POA: Diagnosis not present

## 2014-04-28 DIAGNOSIS — F0390 Unspecified dementia without behavioral disturbance: Secondary | ICD-10-CM | POA: Diagnosis not present

## 2014-04-28 DIAGNOSIS — Z7901 Long term (current) use of anticoagulants: Secondary | ICD-10-CM | POA: Diagnosis not present

## 2014-04-28 DIAGNOSIS — I110 Hypertensive heart disease with heart failure: Secondary | ICD-10-CM | POA: Diagnosis not present

## 2014-05-02 DIAGNOSIS — R0602 Shortness of breath: Secondary | ICD-10-CM | POA: Diagnosis not present

## 2014-05-02 DIAGNOSIS — I340 Nonrheumatic mitral (valve) insufficiency: Secondary | ICD-10-CM | POA: Diagnosis not present

## 2014-05-02 DIAGNOSIS — I110 Hypertensive heart disease with heart failure: Secondary | ICD-10-CM | POA: Diagnosis not present

## 2014-05-02 DIAGNOSIS — I5041 Acute combined systolic (congestive) and diastolic (congestive) heart failure: Secondary | ICD-10-CM | POA: Diagnosis not present

## 2014-05-02 DIAGNOSIS — Z7901 Long term (current) use of anticoagulants: Secondary | ICD-10-CM | POA: Diagnosis not present

## 2014-05-02 DIAGNOSIS — Q233 Congenital mitral insufficiency: Secondary | ICD-10-CM | POA: Diagnosis not present

## 2014-05-02 DIAGNOSIS — I5032 Chronic diastolic (congestive) heart failure: Secondary | ICD-10-CM | POA: Diagnosis not present

## 2014-05-02 DIAGNOSIS — I482 Chronic atrial fibrillation: Secondary | ICD-10-CM | POA: Diagnosis not present

## 2014-05-02 DIAGNOSIS — F0390 Unspecified dementia without behavioral disturbance: Secondary | ICD-10-CM | POA: Diagnosis not present

## 2014-05-05 DIAGNOSIS — I5041 Acute combined systolic (congestive) and diastolic (congestive) heart failure: Secondary | ICD-10-CM | POA: Diagnosis not present

## 2014-05-05 DIAGNOSIS — I482 Chronic atrial fibrillation: Secondary | ICD-10-CM | POA: Diagnosis not present

## 2014-05-05 DIAGNOSIS — F0390 Unspecified dementia without behavioral disturbance: Secondary | ICD-10-CM | POA: Diagnosis not present

## 2014-05-05 DIAGNOSIS — Q233 Congenital mitral insufficiency: Secondary | ICD-10-CM | POA: Diagnosis not present

## 2014-05-05 DIAGNOSIS — I110 Hypertensive heart disease with heart failure: Secondary | ICD-10-CM | POA: Diagnosis not present

## 2014-05-05 DIAGNOSIS — Z7901 Long term (current) use of anticoagulants: Secondary | ICD-10-CM | POA: Diagnosis not present

## 2014-05-06 DIAGNOSIS — Z79899 Other long term (current) drug therapy: Secondary | ICD-10-CM | POA: Diagnosis not present

## 2014-05-06 DIAGNOSIS — G301 Alzheimer's disease with late onset: Secondary | ICD-10-CM | POA: Diagnosis not present

## 2014-05-06 DIAGNOSIS — I340 Nonrheumatic mitral (valve) insufficiency: Secondary | ICD-10-CM | POA: Diagnosis not present

## 2014-05-06 DIAGNOSIS — I5033 Acute on chronic diastolic (congestive) heart failure: Secondary | ICD-10-CM | POA: Diagnosis not present

## 2014-05-07 DIAGNOSIS — I5041 Acute combined systolic (congestive) and diastolic (congestive) heart failure: Secondary | ICD-10-CM | POA: Diagnosis not present

## 2014-05-07 DIAGNOSIS — I110 Hypertensive heart disease with heart failure: Secondary | ICD-10-CM | POA: Diagnosis not present

## 2014-05-07 DIAGNOSIS — Z7901 Long term (current) use of anticoagulants: Secondary | ICD-10-CM | POA: Diagnosis not present

## 2014-05-07 DIAGNOSIS — Q233 Congenital mitral insufficiency: Secondary | ICD-10-CM | POA: Diagnosis not present

## 2014-05-07 DIAGNOSIS — I482 Chronic atrial fibrillation: Secondary | ICD-10-CM | POA: Diagnosis not present

## 2014-05-07 DIAGNOSIS — F0390 Unspecified dementia without behavioral disturbance: Secondary | ICD-10-CM | POA: Diagnosis not present

## 2014-05-10 DIAGNOSIS — I5041 Acute combined systolic (congestive) and diastolic (congestive) heart failure: Secondary | ICD-10-CM | POA: Diagnosis not present

## 2014-05-10 DIAGNOSIS — I10 Essential (primary) hypertension: Secondary | ICD-10-CM | POA: Diagnosis not present

## 2014-05-10 DIAGNOSIS — F0390 Unspecified dementia without behavioral disturbance: Secondary | ICD-10-CM | POA: Diagnosis not present

## 2014-05-10 DIAGNOSIS — I482 Chronic atrial fibrillation: Secondary | ICD-10-CM | POA: Diagnosis not present

## 2014-05-10 DIAGNOSIS — I110 Hypertensive heart disease with heart failure: Secondary | ICD-10-CM | POA: Diagnosis not present

## 2014-05-10 DIAGNOSIS — Q233 Congenital mitral insufficiency: Secondary | ICD-10-CM | POA: Diagnosis not present

## 2014-05-10 DIAGNOSIS — Z7901 Long term (current) use of anticoagulants: Secondary | ICD-10-CM | POA: Diagnosis not present

## 2014-05-12 DIAGNOSIS — Q233 Congenital mitral insufficiency: Secondary | ICD-10-CM | POA: Diagnosis not present

## 2014-05-12 DIAGNOSIS — Z7901 Long term (current) use of anticoagulants: Secondary | ICD-10-CM | POA: Diagnosis not present

## 2014-05-12 DIAGNOSIS — I5041 Acute combined systolic (congestive) and diastolic (congestive) heart failure: Secondary | ICD-10-CM | POA: Diagnosis not present

## 2014-05-12 DIAGNOSIS — I482 Chronic atrial fibrillation: Secondary | ICD-10-CM | POA: Diagnosis not present

## 2014-05-12 DIAGNOSIS — I110 Hypertensive heart disease with heart failure: Secondary | ICD-10-CM | POA: Diagnosis not present

## 2014-05-12 DIAGNOSIS — F0390 Unspecified dementia without behavioral disturbance: Secondary | ICD-10-CM | POA: Diagnosis not present

## 2014-05-17 DIAGNOSIS — I5041 Acute combined systolic (congestive) and diastolic (congestive) heart failure: Secondary | ICD-10-CM | POA: Diagnosis not present

## 2014-05-17 DIAGNOSIS — I482 Chronic atrial fibrillation: Secondary | ICD-10-CM | POA: Diagnosis not present

## 2014-05-17 DIAGNOSIS — F0390 Unspecified dementia without behavioral disturbance: Secondary | ICD-10-CM | POA: Diagnosis not present

## 2014-05-17 DIAGNOSIS — Z7901 Long term (current) use of anticoagulants: Secondary | ICD-10-CM | POA: Diagnosis not present

## 2014-05-17 DIAGNOSIS — Q233 Congenital mitral insufficiency: Secondary | ICD-10-CM | POA: Diagnosis not present

## 2014-05-17 DIAGNOSIS — I110 Hypertensive heart disease with heart failure: Secondary | ICD-10-CM | POA: Diagnosis not present

## 2014-05-19 DIAGNOSIS — Z7901 Long term (current) use of anticoagulants: Secondary | ICD-10-CM | POA: Diagnosis not present

## 2014-05-19 DIAGNOSIS — F0390 Unspecified dementia without behavioral disturbance: Secondary | ICD-10-CM | POA: Diagnosis not present

## 2014-05-19 DIAGNOSIS — I5041 Acute combined systolic (congestive) and diastolic (congestive) heart failure: Secondary | ICD-10-CM | POA: Diagnosis not present

## 2014-05-19 DIAGNOSIS — Q233 Congenital mitral insufficiency: Secondary | ICD-10-CM | POA: Diagnosis not present

## 2014-05-19 DIAGNOSIS — I110 Hypertensive heart disease with heart failure: Secondary | ICD-10-CM | POA: Diagnosis not present

## 2014-05-19 DIAGNOSIS — I482 Chronic atrial fibrillation: Secondary | ICD-10-CM | POA: Diagnosis not present

## 2014-05-24 DIAGNOSIS — I340 Nonrheumatic mitral (valve) insufficiency: Secondary | ICD-10-CM | POA: Diagnosis not present

## 2014-05-24 DIAGNOSIS — I482 Chronic atrial fibrillation: Secondary | ICD-10-CM | POA: Diagnosis not present

## 2014-05-24 DIAGNOSIS — I5032 Chronic diastolic (congestive) heart failure: Secondary | ICD-10-CM | POA: Diagnosis not present

## 2014-05-24 DIAGNOSIS — I5041 Acute combined systolic (congestive) and diastolic (congestive) heart failure: Secondary | ICD-10-CM | POA: Diagnosis not present

## 2014-05-24 DIAGNOSIS — Z7901 Long term (current) use of anticoagulants: Secondary | ICD-10-CM | POA: Diagnosis not present

## 2014-05-24 DIAGNOSIS — F0390 Unspecified dementia without behavioral disturbance: Secondary | ICD-10-CM | POA: Diagnosis not present

## 2014-05-24 DIAGNOSIS — Q233 Congenital mitral insufficiency: Secondary | ICD-10-CM | POA: Diagnosis not present

## 2014-05-24 DIAGNOSIS — I110 Hypertensive heart disease with heart failure: Secondary | ICD-10-CM | POA: Diagnosis not present

## 2014-05-26 DIAGNOSIS — I5041 Acute combined systolic (congestive) and diastolic (congestive) heart failure: Secondary | ICD-10-CM | POA: Diagnosis not present

## 2014-05-26 DIAGNOSIS — F0390 Unspecified dementia without behavioral disturbance: Secondary | ICD-10-CM | POA: Diagnosis not present

## 2014-05-26 DIAGNOSIS — Q233 Congenital mitral insufficiency: Secondary | ICD-10-CM | POA: Diagnosis not present

## 2014-05-26 DIAGNOSIS — I110 Hypertensive heart disease with heart failure: Secondary | ICD-10-CM | POA: Diagnosis not present

## 2014-05-26 DIAGNOSIS — Z7901 Long term (current) use of anticoagulants: Secondary | ICD-10-CM | POA: Diagnosis not present

## 2014-05-26 DIAGNOSIS — I482 Chronic atrial fibrillation: Secondary | ICD-10-CM | POA: Diagnosis not present

## 2014-05-27 DIAGNOSIS — Z0000 Encounter for general adult medical examination without abnormal findings: Secondary | ICD-10-CM | POA: Diagnosis not present

## 2014-05-27 DIAGNOSIS — G301 Alzheimer's disease with late onset: Secondary | ICD-10-CM | POA: Diagnosis not present

## 2014-05-27 DIAGNOSIS — E871 Hypo-osmolality and hyponatremia: Secondary | ICD-10-CM | POA: Diagnosis not present

## 2014-05-27 DIAGNOSIS — Z1389 Encounter for screening for other disorder: Secondary | ICD-10-CM | POA: Diagnosis not present

## 2014-05-27 DIAGNOSIS — Z79899 Other long term (current) drug therapy: Secondary | ICD-10-CM | POA: Diagnosis not present

## 2014-05-27 DIAGNOSIS — I272 Other secondary pulmonary hypertension: Secondary | ICD-10-CM | POA: Diagnosis not present

## 2014-05-27 DIAGNOSIS — I509 Heart failure, unspecified: Secondary | ICD-10-CM | POA: Diagnosis not present

## 2014-05-27 DIAGNOSIS — K219 Gastro-esophageal reflux disease without esophagitis: Secondary | ICD-10-CM | POA: Diagnosis not present

## 2014-05-31 DIAGNOSIS — I482 Chronic atrial fibrillation: Secondary | ICD-10-CM | POA: Diagnosis not present

## 2014-05-31 DIAGNOSIS — I110 Hypertensive heart disease with heart failure: Secondary | ICD-10-CM | POA: Diagnosis not present

## 2014-05-31 DIAGNOSIS — Q233 Congenital mitral insufficiency: Secondary | ICD-10-CM | POA: Diagnosis not present

## 2014-05-31 DIAGNOSIS — F0390 Unspecified dementia without behavioral disturbance: Secondary | ICD-10-CM | POA: Diagnosis not present

## 2014-05-31 DIAGNOSIS — Z7901 Long term (current) use of anticoagulants: Secondary | ICD-10-CM | POA: Diagnosis not present

## 2014-05-31 DIAGNOSIS — I5041 Acute combined systolic (congestive) and diastolic (congestive) heart failure: Secondary | ICD-10-CM | POA: Diagnosis not present

## 2014-06-04 DIAGNOSIS — I110 Hypertensive heart disease with heart failure: Secondary | ICD-10-CM | POA: Diagnosis not present

## 2014-06-04 DIAGNOSIS — Q233 Congenital mitral insufficiency: Secondary | ICD-10-CM | POA: Diagnosis not present

## 2014-06-04 DIAGNOSIS — Z7901 Long term (current) use of anticoagulants: Secondary | ICD-10-CM | POA: Diagnosis not present

## 2014-06-04 DIAGNOSIS — F0390 Unspecified dementia without behavioral disturbance: Secondary | ICD-10-CM | POA: Diagnosis not present

## 2014-06-04 DIAGNOSIS — I482 Chronic atrial fibrillation: Secondary | ICD-10-CM | POA: Diagnosis not present

## 2014-06-04 DIAGNOSIS — I5041 Acute combined systolic (congestive) and diastolic (congestive) heart failure: Secondary | ICD-10-CM | POA: Diagnosis not present

## 2014-06-09 DIAGNOSIS — Q233 Congenital mitral insufficiency: Secondary | ICD-10-CM | POA: Diagnosis not present

## 2014-06-09 DIAGNOSIS — I110 Hypertensive heart disease with heart failure: Secondary | ICD-10-CM | POA: Diagnosis not present

## 2014-06-09 DIAGNOSIS — Z7901 Long term (current) use of anticoagulants: Secondary | ICD-10-CM | POA: Diagnosis not present

## 2014-06-09 DIAGNOSIS — F0390 Unspecified dementia without behavioral disturbance: Secondary | ICD-10-CM | POA: Diagnosis not present

## 2014-06-09 DIAGNOSIS — I5041 Acute combined systolic (congestive) and diastolic (congestive) heart failure: Secondary | ICD-10-CM | POA: Diagnosis not present

## 2014-06-09 DIAGNOSIS — I482 Chronic atrial fibrillation: Secondary | ICD-10-CM | POA: Diagnosis not present

## 2014-06-10 DIAGNOSIS — I482 Chronic atrial fibrillation: Secondary | ICD-10-CM | POA: Diagnosis not present

## 2014-06-10 DIAGNOSIS — I110 Hypertensive heart disease with heart failure: Secondary | ICD-10-CM | POA: Diagnosis not present

## 2014-06-10 DIAGNOSIS — Z7901 Long term (current) use of anticoagulants: Secondary | ICD-10-CM | POA: Diagnosis not present

## 2014-06-10 DIAGNOSIS — F0390 Unspecified dementia without behavioral disturbance: Secondary | ICD-10-CM | POA: Diagnosis not present

## 2014-06-10 DIAGNOSIS — Q233 Congenital mitral insufficiency: Secondary | ICD-10-CM | POA: Diagnosis not present

## 2014-06-10 DIAGNOSIS — I5041 Acute combined systolic (congestive) and diastolic (congestive) heart failure: Secondary | ICD-10-CM | POA: Diagnosis not present

## 2014-06-20 DIAGNOSIS — Z7901 Long term (current) use of anticoagulants: Secondary | ICD-10-CM | POA: Diagnosis not present

## 2014-06-20 DIAGNOSIS — I482 Chronic atrial fibrillation: Secondary | ICD-10-CM | POA: Diagnosis not present

## 2014-06-20 DIAGNOSIS — I5041 Acute combined systolic (congestive) and diastolic (congestive) heart failure: Secondary | ICD-10-CM | POA: Diagnosis not present

## 2014-06-20 DIAGNOSIS — I110 Hypertensive heart disease with heart failure: Secondary | ICD-10-CM | POA: Diagnosis not present

## 2014-06-20 DIAGNOSIS — Q233 Congenital mitral insufficiency: Secondary | ICD-10-CM | POA: Diagnosis not present

## 2014-06-20 DIAGNOSIS — F0390 Unspecified dementia without behavioral disturbance: Secondary | ICD-10-CM | POA: Diagnosis not present

## 2014-08-04 DIAGNOSIS — Z7901 Long term (current) use of anticoagulants: Secondary | ICD-10-CM | POA: Diagnosis not present

## 2014-08-04 DIAGNOSIS — I5032 Chronic diastolic (congestive) heart failure: Secondary | ICD-10-CM | POA: Diagnosis not present

## 2014-08-04 DIAGNOSIS — I482 Chronic atrial fibrillation: Secondary | ICD-10-CM | POA: Diagnosis not present

## 2014-11-29 DIAGNOSIS — Z01419 Encounter for gynecological examination (general) (routine) without abnormal findings: Secondary | ICD-10-CM | POA: Diagnosis not present

## 2014-11-29 DIAGNOSIS — Z1231 Encounter for screening mammogram for malignant neoplasm of breast: Secondary | ICD-10-CM | POA: Diagnosis not present

## 2014-11-29 DIAGNOSIS — Z6820 Body mass index (BMI) 20.0-20.9, adult: Secondary | ICD-10-CM | POA: Diagnosis not present

## 2015-01-23 DIAGNOSIS — I340 Nonrheumatic mitral (valve) insufficiency: Secondary | ICD-10-CM | POA: Diagnosis not present

## 2015-01-23 DIAGNOSIS — I5032 Chronic diastolic (congestive) heart failure: Secondary | ICD-10-CM | POA: Diagnosis not present

## 2015-01-23 DIAGNOSIS — Z7901 Long term (current) use of anticoagulants: Secondary | ICD-10-CM | POA: Diagnosis not present

## 2015-01-23 DIAGNOSIS — I482 Chronic atrial fibrillation: Secondary | ICD-10-CM | POA: Diagnosis not present

## 2015-02-10 DIAGNOSIS — Z1389 Encounter for screening for other disorder: Secondary | ICD-10-CM | POA: Diagnosis not present

## 2015-02-10 DIAGNOSIS — G4700 Insomnia, unspecified: Secondary | ICD-10-CM | POA: Diagnosis not present

## 2015-02-10 DIAGNOSIS — I272 Other secondary pulmonary hypertension: Secondary | ICD-10-CM | POA: Diagnosis not present

## 2015-02-10 DIAGNOSIS — I340 Nonrheumatic mitral (valve) insufficiency: Secondary | ICD-10-CM | POA: Diagnosis not present

## 2015-02-10 DIAGNOSIS — I351 Nonrheumatic aortic (valve) insufficiency: Secondary | ICD-10-CM | POA: Diagnosis not present

## 2015-02-10 DIAGNOSIS — E780 Pure hypercholesterolemia: Secondary | ICD-10-CM | POA: Diagnosis not present

## 2015-02-10 DIAGNOSIS — I509 Heart failure, unspecified: Secondary | ICD-10-CM | POA: Diagnosis not present

## 2015-02-10 DIAGNOSIS — Z0000 Encounter for general adult medical examination without abnormal findings: Secondary | ICD-10-CM | POA: Diagnosis not present

## 2015-02-10 DIAGNOSIS — G301 Alzheimer's disease with late onset: Secondary | ICD-10-CM | POA: Diagnosis not present

## 2015-02-10 DIAGNOSIS — E039 Hypothyroidism, unspecified: Secondary | ICD-10-CM | POA: Diagnosis not present

## 2015-02-17 DIAGNOSIS — H26491 Other secondary cataract, right eye: Secondary | ICD-10-CM | POA: Diagnosis not present

## 2015-02-17 DIAGNOSIS — H2512 Age-related nuclear cataract, left eye: Secondary | ICD-10-CM | POA: Diagnosis not present

## 2015-02-17 DIAGNOSIS — Z961 Presence of intraocular lens: Secondary | ICD-10-CM | POA: Diagnosis not present

## 2015-03-16 DIAGNOSIS — L821 Other seborrheic keratosis: Secondary | ICD-10-CM | POA: Diagnosis not present

## 2015-03-16 DIAGNOSIS — L570 Actinic keratosis: Secondary | ICD-10-CM | POA: Diagnosis not present

## 2015-03-16 DIAGNOSIS — L814 Other melanin hyperpigmentation: Secondary | ICD-10-CM | POA: Diagnosis not present

## 2015-03-16 DIAGNOSIS — D485 Neoplasm of uncertain behavior of skin: Secondary | ICD-10-CM | POA: Diagnosis not present

## 2015-03-16 DIAGNOSIS — D1801 Hemangioma of skin and subcutaneous tissue: Secondary | ICD-10-CM | POA: Diagnosis not present

## 2015-04-27 DIAGNOSIS — Z23 Encounter for immunization: Secondary | ICD-10-CM | POA: Diagnosis not present

## 2015-06-08 ENCOUNTER — Ambulatory Visit
Admission: RE | Admit: 2015-06-08 | Discharge: 2015-06-08 | Disposition: A | Discharge status: Home / Self Care | Payer: Medicare Other | Source: Ambulatory Visit | Attending: Cardiology | Admitting: Cardiology

## 2015-06-08 ENCOUNTER — Other Ambulatory Visit: Payer: Self-pay | Admitting: Cardiology

## 2015-06-08 DIAGNOSIS — I5032 Chronic diastolic (congestive) heart failure: Secondary | ICD-10-CM | POA: Diagnosis not present

## 2015-06-08 DIAGNOSIS — R062 Wheezing: Secondary | ICD-10-CM

## 2015-06-08 DIAGNOSIS — I340 Nonrheumatic mitral (valve) insufficiency: Secondary | ICD-10-CM | POA: Diagnosis not present

## 2015-06-08 DIAGNOSIS — R5383 Other fatigue: Secondary | ICD-10-CM | POA: Diagnosis not present

## 2015-06-08 DIAGNOSIS — R079 Chest pain, unspecified: Secondary | ICD-10-CM | POA: Diagnosis not present

## 2015-06-08 DIAGNOSIS — R05 Cough: Secondary | ICD-10-CM | POA: Diagnosis not present

## 2015-06-08 DIAGNOSIS — R0602 Shortness of breath: Secondary | ICD-10-CM | POA: Diagnosis not present

## 2015-06-09 DIAGNOSIS — R0602 Shortness of breath: Secondary | ICD-10-CM | POA: Diagnosis not present

## 2015-06-09 DIAGNOSIS — I480 Paroxysmal atrial fibrillation: Secondary | ICD-10-CM | POA: Diagnosis not present

## 2015-06-09 DIAGNOSIS — I340 Nonrheumatic mitral (valve) insufficiency: Secondary | ICD-10-CM | POA: Diagnosis not present

## 2015-06-09 DIAGNOSIS — I5032 Chronic diastolic (congestive) heart failure: Secondary | ICD-10-CM | POA: Diagnosis not present

## 2015-06-09 DIAGNOSIS — I482 Chronic atrial fibrillation: Secondary | ICD-10-CM | POA: Diagnosis not present

## 2015-06-09 DIAGNOSIS — R079 Chest pain, unspecified: Secondary | ICD-10-CM | POA: Diagnosis not present

## 2015-06-09 DIAGNOSIS — I493 Ventricular premature depolarization: Secondary | ICD-10-CM | POA: Diagnosis not present

## 2015-06-09 DIAGNOSIS — I10 Essential (primary) hypertension: Secondary | ICD-10-CM | POA: Diagnosis not present

## 2015-06-09 DIAGNOSIS — Z7901 Long term (current) use of anticoagulants: Secondary | ICD-10-CM | POA: Diagnosis not present

## 2015-06-09 DIAGNOSIS — R5383 Other fatigue: Secondary | ICD-10-CM | POA: Diagnosis not present

## 2015-06-23 DIAGNOSIS — R0602 Shortness of breath: Secondary | ICD-10-CM | POA: Diagnosis not present

## 2015-06-23 DIAGNOSIS — I5032 Chronic diastolic (congestive) heart failure: Secondary | ICD-10-CM | POA: Diagnosis not present

## 2015-06-23 DIAGNOSIS — Z7901 Long term (current) use of anticoagulants: Secondary | ICD-10-CM | POA: Diagnosis not present

## 2015-06-23 DIAGNOSIS — I482 Chronic atrial fibrillation: Secondary | ICD-10-CM | POA: Diagnosis not present

## 2015-07-29 ENCOUNTER — Inpatient Hospital Stay (HOSPITAL_COMMUNITY)
Admission: EM | Admit: 2015-07-29 | Discharge: 2015-07-29 | DRG: 310 | Disposition: A | Discharge status: Home / Self Care | Payer: Medicare Other | Attending: Cardiology | Admitting: Cardiology

## 2015-07-29 ENCOUNTER — Encounter (HOSPITAL_COMMUNITY): Payer: Self-pay | Admitting: Emergency Medicine

## 2015-07-29 ENCOUNTER — Emergency Department (HOSPITAL_COMMUNITY): Payer: Medicare Other

## 2015-07-29 ENCOUNTER — Telehealth: Payer: Self-pay | Admitting: Physician Assistant

## 2015-07-29 DIAGNOSIS — I441 Atrioventricular block, second degree: Secondary | ICD-10-CM | POA: Diagnosis not present

## 2015-07-29 DIAGNOSIS — Z79899 Other long term (current) drug therapy: Secondary | ICD-10-CM

## 2015-07-29 DIAGNOSIS — I509 Heart failure, unspecified: Secondary | ICD-10-CM | POA: Diagnosis present

## 2015-07-29 DIAGNOSIS — R001 Bradycardia, unspecified: Principal | ICD-10-CM | POA: Diagnosis present

## 2015-07-29 DIAGNOSIS — I5032 Chronic diastolic (congestive) heart failure: Secondary | ICD-10-CM | POA: Insufficient documentation

## 2015-07-29 DIAGNOSIS — F0280 Dementia in other diseases classified elsewhere without behavioral disturbance: Secondary | ICD-10-CM | POA: Diagnosis present

## 2015-07-29 DIAGNOSIS — R0602 Shortness of breath: Secondary | ICD-10-CM | POA: Diagnosis not present

## 2015-07-29 DIAGNOSIS — F419 Anxiety disorder, unspecified: Secondary | ICD-10-CM | POA: Diagnosis present

## 2015-07-29 DIAGNOSIS — I110 Hypertensive heart disease with heart failure: Secondary | ICD-10-CM | POA: Diagnosis present

## 2015-07-29 DIAGNOSIS — G309 Alzheimer's disease, unspecified: Secondary | ICD-10-CM | POA: Diagnosis present

## 2015-07-29 DIAGNOSIS — Z7901 Long term (current) use of anticoagulants: Secondary | ICD-10-CM

## 2015-07-29 DIAGNOSIS — I482 Chronic atrial fibrillation: Secondary | ICD-10-CM | POA: Diagnosis present

## 2015-07-29 DIAGNOSIS — Z96643 Presence of artificial hip joint, bilateral: Secondary | ICD-10-CM | POA: Diagnosis present

## 2015-07-29 DIAGNOSIS — M1990 Unspecified osteoarthritis, unspecified site: Secondary | ICD-10-CM | POA: Diagnosis present

## 2015-07-29 DIAGNOSIS — I4891 Unspecified atrial fibrillation: Secondary | ICD-10-CM

## 2015-07-29 HISTORY — DX: Bradycardia, unspecified: R00.1

## 2015-07-29 LAB — I-STAT CHEM 8, ED
BUN: 15 mg/dL (ref 6–20)
CREATININE: 0.9 mg/dL (ref 0.44–1.00)
Calcium, Ion: 1.2 mmol/L (ref 1.13–1.30)
Chloride: 101 mmol/L (ref 101–111)
GLUCOSE: 89 mg/dL (ref 65–99)
HCT: 42 % (ref 36.0–46.0)
HEMOGLOBIN: 14.3 g/dL (ref 12.0–15.0)
Potassium: 4 mmol/L (ref 3.5–5.1)
Sodium: 139 mmol/L (ref 135–145)
TCO2: 23 mmol/L (ref 0–100)

## 2015-07-29 LAB — BASIC METABOLIC PANEL
ANION GAP: 10 (ref 5–15)
BUN: 14 mg/dL (ref 6–20)
CO2: 26 mmol/L (ref 22–32)
Calcium: 9.5 mg/dL (ref 8.9–10.3)
Chloride: 103 mmol/L (ref 101–111)
Creatinine, Ser: 0.97 mg/dL (ref 0.44–1.00)
GFR calc Af Amer: >60 mL/min (ref >60)
GFR calc non Af Amer: 53 mL/min — ABNORMAL LOW (ref >60)
Glucose, Bld: 93 mg/dL (ref 65–99)
POTASSIUM: 4.3 mmol/L (ref 3.5–5.1)
Sodium: 139 mmol/L (ref 135–145)

## 2015-07-29 LAB — CBC WITH DIFFERENTIAL/PLATELET
Basophils Absolute: 0 10*3/uL (ref 0.0–0.1)
Basophils Relative: 1 %
Eosinophils Absolute: 0.1 10*3/uL (ref 0.0–0.7)
Eosinophils Relative: 2 %
HEMATOCRIT: 39.4 % (ref 36.0–46.0)
Hemoglobin: 12.9 g/dL (ref 12.0–15.0)
LYMPHS PCT: 27 %
Lymphs Abs: 1.8 10*3/uL (ref 0.7–4.0)
MCH: 28.2 pg (ref 26.0–34.0)
MCHC: 32.7 g/dL (ref 30.0–36.0)
MCV: 86.2 fL (ref 78.0–100.0)
MONOS PCT: 6 %
Monocytes Absolute: 0.4 10*3/uL (ref 0.1–1.0)
NEUTROS ABS: 4.3 10*3/uL (ref 1.7–7.7)
Neutrophils Relative %: 64 %
Platelets: 211 10*3/uL (ref 150–400)
RBC: 4.57 MIL/uL (ref 3.87–5.11)
RDW: 15.2 % (ref 11.5–15.5)
WBC: 6.6 10*3/uL (ref 4.0–10.5)

## 2015-07-29 LAB — MAGNESIUM: Magnesium: 2.2 mg/dL (ref 1.7–2.4)

## 2015-07-29 LAB — I-STAT TROPONIN, ED: Troponin i, poc: 0.01 ng/mL (ref 0.00–0.08)

## 2015-07-29 LAB — TSH: TSH: 2.276 u[IU]/mL (ref 0.350–4.500)

## 2015-07-29 LAB — T4, FREE: Free T4: 1.37 ng/dL — ABNORMAL HIGH (ref 0.61–1.12)

## 2015-07-29 NOTE — ED Notes (Signed)
Pt noted to be bradycardic today; pt sts hx of same; no complaints per family; HR noted to be 30bpm

## 2015-07-29 NOTE — Telephone Encounter (Signed)
Received call from Dr. Sallyanne Kuster. He saw this patient in the ER for bradycardia and has recommended permanent pacemaker implantation. Given the patient's history of dementia it was felt she would best be served by outpatient f/u to return for this procedure. He would like this scheduled for Wednesday 08/02/15. Please arrange. (It is the weekend so I do not have the ability to do so.) I am forwarding this message to [place], as well as [employer] and [employer].  Of note, the patient will be seeing Dr. Wynonia Lawman in f/u on Monday 07/31/15.  Dayna Dunn PA-C

## 2015-07-29 NOTE — ED Notes (Signed)
Cards at bedside

## 2015-07-29 NOTE — Consult Note (Signed)
Reason for Consult: Bradycardia  Requesting Physician: Regenia Skeeter  Cardiologist: Wynonia Lawman  HPI: This is a 82 y.o. female with a past medical history significant for permanent atrial fibrillation and mild-moderate Alzheimer's dementia. She was brought to ED by her son due to very low pulse recordings, but she did not describe dizziness, fatigue and has not experienced syncope. There was some concern for bradycardia ass an outpatient, but an event monitor performed by Dr. Wynonia Lawman did not show any.Marland Kitchen Here in the ED she has atrial fibrillation with slow ventricular response (40-60), occasionally with further bradycardia and idioventricular escape beats, HR as slow as 28-30 bpm. I was talking to her during such episodes and she was asymptomatic, unaware of any problem. She was able to walk to the bathroom and back and heart rate briefly increased to 55 bpm.  She does not take cardiac meds that are negative chronotropes. She has been on exelon and namenda for at least 2 years. No other reversible cause of bradycardia/worsening AV block is identified and the problem is probably age related.  She has had episodes of severe sundowning and confusion, especially during previous hospitalizations.  PMHx:  Past Medical History  Diagnosis Date  . Hypertension   . Atrial fibrillation ([place])   . Dementia     mild  . Heart murmur   . Arthritis   . Acute congestive heart failure ([place]) 04/19/2014  . Chronic anticoagulation    Past Surgical History  Procedure Laterality Date  . Total hip arthroplasty Bilateral     Left in 2007, right in 2001  . Colonoscopy  2002    Remote history of polypectomy    FAMHx: Family History  Problem Relation Age of Onset  . Pneumonia Mother 61    SOCHx:  reports that she has never smoked. She has never used smokeless tobacco. She reports that she does not drink alcohol or use illicit drugs.  ALLERGIES: Allergies  Allergen Reactions  . Dilaudid [Hydromorphone  Hcl] Other (See Comments)    hallucinations  . Vicodin [Hydrocodone-Acetaminophen] Other (See Comments)    hallucinations    ROS: Review of systems not obtained due to patient factors. Her son fills in the history  HOME MEDICATIONS: No current facility-administered medications on file prior to encounter.   Current Outpatient Prescriptions on File Prior to Encounter  Medication Sig Dispense Refill  . acetaminophen (TYLENOL) 500 MG tablet Take 500 mg by mouth 2 (two) times daily as needed for moderate pain.    Marland Kitchen ALPRAZolam (XANAX) 0.25 MG tablet Take 0.125 mg by mouth 2 (two) times daily as needed for anxiety.    Marland Kitchen apixaban (ELIQUIS) 5 MG TABS tablet Take 5 mg by mouth 2 (two) times daily.    . Calcium Carbonate-Vitamin D (CALCIUM + D PO) Take 1 tablet by mouth 2 (two) times daily.    . Cholecalciferol (VITAMIN D) 2000 UNITS tablet Take 2,000 Units by mouth daily.    . furosemide (LASIX) 40 MG tablet Take 1 tablet (40 mg total) by mouth daily. 60 tablet 12  . losartan (COZAAR) 100 MG tablet Take 100 mg by mouth daily.    . Memantine HCl ER (NAMENDA XR) 28 MG CP24 Take 28 mg by mouth at bedtime.    . Multiple Vitamin (MULTIVITAMIN WITH MINERALS) TABS Take 1 tablet by mouth daily.    . rivastigmine (EXELON) 9.5 mg/24hr Place 1 patch onto the skin daily.       VITALS: Blood pressure 151/62, pulse  42, temperature 97.7 F (36.5 C), temperature source Oral, resp. rate 12, SpO2 98 %.  PHYSICAL EXAM:  General: Alert, oriented x3, no distress Head: no evidence of trauma, PERRL, EOMI, no exophtalmos or lid lag, no myxedema, no xanthelasma; normal ears, nose and oropharynx Neck: normal jugular venous pulsations and no hepatojugular reflux; brisk carotid pulses without delay and no carotid bruits Chest: clear to auscultation, no signs of consolidation by percussion or palpation, normal fremitus, symmetrical and full respiratory excursions Cardiovascular: normal position and quality of the  apical impulse, slow irregular rhythm, normal first heart sound and  second heart sound, no rubs or gallops, intermittent 1/6 systolic murmur Abdomen: no tenderness or distention, no masses by palpation, no abnormal pulsatility or arterial bruits, normal bowel sounds, no hepatosplenomegaly Extremities: no clubbing, cyanosis;  no edema; 2+ radial, ulnar and brachial pulses bilaterally; 2+ right femoral, posterior tibial and dorsalis pedis pulses; 2+ left femoral, posterior tibial and dorsalis pedis pulses; no subclavian or femoral bruits Neurological: grossly nonfocal   LABS  CBC  Recent Labs  07/29/15 1130 07/29/15 1134  WBC 6.6  --   NEUTROABS 4.3  --   HGB 12.9 14.3  HCT 39.4 42.0  MCV 86.2  --   PLT 211  --    Basic Metabolic Panel  Recent Labs  07/29/15 1130 07/29/15 1134  NA 139 139  K 4.3 4.0  CL 103 101  CO2 26  --   GLUCOSE 93 89  BUN 14 15  CREATININE 0.97 0.90  CALCIUM 9.5  --   MG 2.2  --   Thyroid Function Tests  Recent Labs  07/29/15 1130  TSH 2.276      IMAGING: Dg Chest Portable 1 View  07/29/2015  CLINICAL DATA:  Shortness of breath and bradycardia EXAM: PORTABLE CHEST - 1 VIEW COMPARISON:  06/08/2015 FINDINGS: The heart size and mediastinal contours are within normal limits. Both lungs are clear. The visualized skeletal structures are unremarkable. IMPRESSION: No active disease. Electronically Signed   By: Inez Catalina M.D.   On: 07/29/2015 12:00    ECG: atrial fibrillation with slow response, occ. idioventricular escape  TELEMETRY: atrial fibrillation with slow response, occ. idioventricular escape  IMPRESSION: 1. Advanced second degree AV block - she will need a single chamber pacemaker. Since she is asymptomatic, this is not an emergency. It may be 72 hours until this is done. In the mean time, hospital admission is probably more perilous than home care, in view of her history of sundowning. Discussed with patient, son and Dr. Wynonia Lawman. Plan  DC from ED and elective outpatient single chamber pacemaker early next week. Discussed risks/benefits/complications. She has an appt with Dr. Wynonia Lawman Monday afternoon. 2.   Atrial fibrillation, CHADSVasc at least 4. Continue Eliquis, plan to hold 24-48 hours before procedure. 3.   Hypertensive heart disease with diastolic dysfunction, no signs of acute HF.   Time Spent Directly with Patient: 60 minutes  Sanda Klein, MD, [facility] [phone] office [phone] pager   07/29/2015, 1:26 PM

## 2015-07-29 NOTE — Discharge Instructions (Signed)
Your heart rate is very low. If you develop dizziness, lightheadedness, or pass out, return to the ER and call 911 immediately. Otherwise follow-up with the cardiologist this coming week for a pacemaker.

## 2015-07-29 NOTE — ED Notes (Signed)
Pads placed on pt rt severe bradycardia. Pt remains asymptomatic as of now.

## 2015-07-29 NOTE — ED Provider Notes (Addendum)
CSN: [id number]     Arrival date & time 07/29/15  1053 History   First MD Initiated Contact with Patient 07/29/15 1110     Chief Complaint  Patient presents with  . Bradycardia     (Consider location/radiation/quality/duration/timing/severity/associated sxs/prior Treatment) HPI  82 year old female with a history of atrial fibrillation and early Alzheimer's presents with bradycardia. History taken from the son. He has noticed her heart rate in the 30s and 40s over the last couple days. This is lower than her typical 60s and above. Has not had any dizziness, lightheadedness, or syncope. No complaints of chest pain although she states that intermittently she feels like she is about to stop breathing. Son relates that this is a chronic issue and she has a lot of anxiety, especially when going to the doctor. No leg swelling noted by family. She was taken off of her metoprolol about one month ago by Dr. Wynonia Lawman. No other medication changes.  Past Medical History  Diagnosis Date  . Hypertension   . Atrial fibrillation ([place])   . Dementia     mild  . Heart murmur   . Arthritis   . Acute congestive heart failure ([place]) 04/19/2014  . Chronic anticoagulation    Past Surgical History  Procedure Laterality Date  . Total hip arthroplasty Bilateral     Left in 2007, right in 2001  . Colonoscopy  2002    Remote history of polypectomy   Family History  Problem Relation Age of Onset  . Pneumonia Mother 67   Social History  Substance Use Topics  . Smoking status: Never Smoker   . Smokeless tobacco: Never Used  . Alcohol Use: No   OB History    No data available     Review of Systems  Respiratory: Positive for shortness of breath.   Cardiovascular: Negative for chest pain and leg swelling.  Neurological: Negative for dizziness, syncope and light-headedness.  All other systems reviewed and are negative.     Allergies  Dilaudid and Vicodin  Home Medications   Prior to Admission  medications   Medication Sig Start Date End Date Taking? Authorizing Provider  acetaminophen (TYLENOL) 500 MG tablet Take 500 mg by mouth 2 (two) times daily as needed for moderate pain.    Historical Provider, MD  ALPRAZolam Duanne Moron) 0.25 MG tablet Take 0.125 mg by mouth 2 (two) times daily as needed for anxiety.    Historical Provider, MD  apixaban (ELIQUIS) 5 MG TABS tablet Take 5 mg by mouth 2 (two) times daily.    Historical Provider, MD  Calcium Carbonate-Vitamin D (CALCIUM + D PO) Take 1 tablet by mouth 2 (two) times daily.    Historical Provider, MD  Cholecalciferol (VITAMIN D) 2000 UNITS tablet Take 2,000 Units by mouth daily.    Historical Provider, MD  furosemide (LASIX) 40 MG tablet Take 1 tablet (40 mg total) by mouth daily. 04/22/14   Jacolyn Reedy, MD  levothyroxine (SYNTHROID, LEVOTHROID) 50 MCG tablet Take 50 mcg by mouth daily before breakfast.    Historical Provider, MD  losartan (COZAAR) 100 MG tablet Take 100 mg by mouth daily.    Historical Provider, MD  Melatonin 3 MG TABS Take 3 mg by mouth at bedtime as needed (for sleep).    Historical Provider, MD  Memantine HCl ER (NAMENDA XR) 28 MG CP24 Take 28 mg by mouth at bedtime.    Historical Provider, MD  metoprolol tartrate (LOPRESSOR) 25 MG tablet Take 25 mg by mouth  2 (two) times daily.    Historical Provider, MD  Multiple Vitamin (MULTIVITAMIN WITH MINERALS) TABS Take 1 tablet by mouth daily.    Historical Provider, MD  potassium chloride SA (K-DUR,KLOR-CON) 20 MEQ tablet Take 1 tablet (20 mEq total) by mouth daily. 04/22/14   Jacolyn Reedy, MD  Probiotic Product (PROBIOTIC DAILY PO) Take 1 tablet by mouth daily.    Historical Provider, MD  rivastigmine (EXELON) 9.5 mg/24hr Place 1 patch onto the skin daily.    Historical Provider, MD   BP 120/75 mmHg  Pulse 30  Temp(Src) 97.7 F (36.5 C) (Oral)  Resp 18  SpO2 100% Physical Exam  Constitutional: She is oriented to person, place, and time. She appears  well-developed and well-nourished.  HENT:  Head: Normocephalic and atraumatic.  Right Ear: External ear normal.  Left Ear: External ear normal.  Nose: Nose normal.  Eyes: Right eye exhibits no discharge. Left eye exhibits no discharge.  Cardiovascular: Regular rhythm and normal heart sounds.  Bradycardia present.   Pulses:      Radial pulses are 2+ on the right side, and 2+ on the left side.  Pulmonary/Chest: Effort normal and breath sounds normal.  Abdominal: Soft. There is no tenderness.  Neurological: She is alert and oriented to person, place, and time.  Skin: Skin is warm and dry.  Nursing note and vitals reviewed.   ED Course  Procedures (including critical care time) Labs Review Labs Reviewed  BASIC METABOLIC PANEL - Abnormal; Notable for the following:    GFR calc non Af Amer 53 (*)    All other components within normal limits  T4, FREE - Abnormal; Notable for the following:    Free T4 1.37 (*)    All other components within normal limits  CBC WITH DIFFERENTIAL/PLATELET  MAGNESIUM  TSH  I-STAT CHEM 8, ED  I-STAT TROPOININ, ED    Imaging Review Dg Chest Portable 1 View  07/29/2015  CLINICAL DATA:  Shortness of breath and bradycardia EXAM: PORTABLE CHEST - 1 VIEW COMPARISON:  06/08/2015 FINDINGS: The heart size and mediastinal contours are within normal limits. Both lungs are clear. The visualized skeletal structures are unremarkable. IMPRESSION: No active disease. Electronically Signed   By: Inez Catalina M.D.   On: 07/29/2015 12:00   I have personally reviewed and evaluated these images and lab results as part of my medical decision-making.   EKG Interpretation   Date/Time:  Saturday July 29 2015 11:02:10 EST Ventricular Rate:  33 PR Interval:    QRS Duration: 122 QT Interval:  606 QTC Calculation: 448 R Axis:   -46 Text Interpretation:  Atrial fibrillation with slow ventricular response  with premature ventricular or aberrantly conducted complexes Left  axis  deviation Anterior infarct , age undetermined ST \\T \ T wave abnormality,  consider lateral ischemia or digitalis effect Abnormal ECG rate is  significantly slower compared to Oct 2015 Confirmed by Edword Cu  MD, Nathanael Krist  ([id number]) on 07/29/2015 11:09:01 AM      MDM   Final diagnoses:  Bradycardia by electrocardiogram    Patient is currently asymptomatic. She has some dyspnea that seems somewhat anxiety related. Clear lungs and no hypoxia or increased work of breathing. Son states this is normal, she is very claustrophobic and the doctor. Her heart rate is in the low 30s, occasionally high 20s. Given she is not symptomatic, no acute treatment indicated. Cardiology, Dr. Sallyanne Kuster, has seen patient and recommends discharge with pacemaker implantation this upcoming week. She is completely asymptomatic  and was able to ambulate well in ED. She has severe sundowning in previous hospital admissions and so cards and son are hoping to avoid this. Given strict return precautions for symptomatic bradycardia.   Sherwood Gambler, MD 07/29/15 [place], MD 07/29/15 1328

## 2015-07-29 NOTE — ED Notes (Signed)
Pt is in stable condition upon d/c and is escorted from ED via wheelchair. 

## 2015-07-31 ENCOUNTER — Encounter: Payer: Self-pay | Admitting: Cardiology

## 2015-07-31 DIAGNOSIS — R5383 Other fatigue: Secondary | ICD-10-CM | POA: Diagnosis not present

## 2015-07-31 DIAGNOSIS — R0602 Shortness of breath: Secondary | ICD-10-CM | POA: Diagnosis not present

## 2015-07-31 DIAGNOSIS — I495 Sick sinus syndrome: Secondary | ICD-10-CM | POA: Diagnosis not present

## 2015-07-31 DIAGNOSIS — I482 Chronic atrial fibrillation: Secondary | ICD-10-CM | POA: Diagnosis not present

## 2015-07-31 NOTE — Progress Notes (Unsigned)
Patient ID: [name], female   DOB: 22-Jul-1932, 82 y.o.   MRN: [id number]  [name], [name]  Date of visit:  07/31/2015 DOB:  03-21-1933    Age:  82 yrs. Medical record number:  [id number]     Account number:  [id number] Primary Care Provider: Lajean Manes Franklin ____________________________ CURRENT DIAGNOSES  1. Sick sinus syndrome  2. Chronic diastolic heart failure  3. Dyspnea  4. Fatigue  5. Nonrheumatic mitral (valve) insufficiency  6. Long term (current) use of anticoagulants  7. Essential (primary) hypertension  8. Hyperlipidemia, unspecified  9. Chronic atrial fibrillation  10. Ventricular premature depolarization  11. Chest pain  12. Long Term Use of Other Medicine ____________________________ ALLERGIES  Hydrocodone, Confusion ____________________________ MEDICATIONS  1. Caltrate 600 600 mg (1,500 mg) tablet, B.I.D.  2. multivitamin tablet, 1 p.o. daily  3. Vitamin D-3 2,000 unit Tablet, 1 p.o. daily  4. alprazolam 0.25 mg tablet, 1/2 tab prn  5. nitroglycerin 0.4 mg sublingual tablet, PRN  6. Exelon Patch 9.5 mg/24 hr transdermal, q 24 hrs  7. Namenda XR 28 mg capsule sprinkle,extended release, QHS  8. losartan 100 mg tablet, 1 p.o. daily  9. Tylenol Extra Strength 500 mg tablet, BID  10. lansoprazole 30 mg capsule,delayed release, 1 p.o. daily  11. levothyroxine 75 mcg tablet, 1 p.o. daily  12. amlodipine 2.5 mg tablet, 1 p.o. daily  13. furosemide 40 mg tablet, 1 p.o. daily and extra as needed  14. Eliquis 5 mg tablet, BID  15. trazodone 50 mg tablet, 1/2 tab qHS  16. melatonin 10 mg tablet, QHS ____________________________ CHIEF COMPLAINTS  Er this weekend low pulse and echo  Told needs pacer ____________________________ HISTORY OF PRESENT ILLNESS This 82 year old female who has severe dementia and stays at home with the help of caretakers. She is a widow and has chronic atrial fibrillation. There has been concern about bradycardia recently but a recent  event monitor did not show significant bradycardia. She had slow pulse noted when they were checking vital signs such as a pulse oximetry as well as blood pressure and she was seen in the emergency room this weekend with heart rates as low as 28 documented. She was seen by Dr. Sallyanne Kuster to discuss permanent pacemaker implantation with her family and due to the fact that she gets severe sundowning it was opted to discharge her from the emergency room and have her come back in for elective pacemaker implantation. She is really not symptomatic and has not had syncope that I can tell. She does have significant malaise and fatigue. Getting an accurate history in her is  very difficult due to her dementia. She in fact today has no recollection of having been in the emergency room this weekend. She has a history of moderate mitral regurgitation. She also has some diastolic congestive heart failure. Her echocardiogram in July of 2016 showed mild aortic regurgitation, moderate mitral regurgitation, a low normal ejection fraction of 50-55% and a left bundle-branch block pattern on her EKG. She also has marked left atrial enlargement.  ____________________________ PAST HISTORY  Past Medical Illnesses:  hypertension, osteoarthritis, hyperlipidemia, history of shingles, dementia;  Cardiovascular Illnesses:  atrial fibrillation February 2007, mitral regurgitation, CHF diastolic;  Surgical Procedures:  mole removed from back, hip replacement-bil;  NYHA Classification:  II;  Canadian Angina Classification:  Class 0: Asymptomatic;  Cardiology Procedures-Invasive:  cardiac cath (left);  Cardiology Procedures-Noninvasive:  TEE March 2007, adenosine cardiolite March 2007, echocardiogram May 2014, echocardiogram July 2015, event monitor  September 2015, echocardiogram July 2016, event monitor December 2016;  LVEF of 55% documented via echocardiogram on 01/23/2015,   ____________________________ CARDIO-PULMONARY TEST DATES EKG Date:   06/08/2015;  Holter/Event Monitor Date: 06/09/2015;  Nuclear Study Date:  09/25/2005;  Echocardiography Date: 01/23/2015;  Chest Xray Date: 11/05/2012;   ____________________________ FAMILY HISTORY Father -- Father dead, Hemophilia Mother -- Mother dead, 69 Sister -- Sister alive and well ____________________________ SOCIAL HISTORY Alcohol Use:  socially;  Smoking:  used to smoke but quit 1996;  Diet:  regular diet;  Lifestyle:  divorced, 1 son and 1 daughter;  Exercise:  exercise is limited due to physical disability;  Occupation:  retired and [employer];  Residence:  lives alone;   ____________________________ REVIEW OF SYSTEMS General:  malaise and fatigue  Integumentary:history of solar keratosis Eyes: wears eye glasses/contact lenses Respiratory: dyspnea Cardiovascular:  please review HPI Abdominal: denies dyspepsia, GI bleeding, constipation, or diarrhea Genitourinary-Female: no dysuria, urgency, frequency, or nocturia  Musculoskeletal:  arthritis of the left knee, arthritis of the hips Neurological:  memory loss Psychiatric:  claustrophobia  ____________________________ PHYSICAL EXAMINATION VITAL SIGNS  Blood Pressure:  126/70 Sitting, Left arm, regular cuff  , 134/70 Standing, Left arm and regular cuff   Pulse:  48/min. Weight:  135.50 lbs. Height:  67"BMI: 21  Constitutional:  pleasant white female, in no acute distress Skin:  ecchymosis present Head:  normocephalic, normal hair pattern, no masses or tenderness ENT:  ears, nose and throat reveal no gross abnormalities.  Dentition good. Neck:  supple, no masses, thyromegaly, JVD. Carotid pulses are full and equal bilaterally without bruits. Chest:  normal symmetry, clear to auscultation. Cardiac:  irregular rhythm, normal S1 and S2, grade 2/6 systolic murmur Peripheral Pulses:  the femoral,dorsalis pedis, and posterior tibial pulses are full and equal bilaterally with no bruits auscultated. Extremities & Back:  mild  bilateral venous insufficiency changes present, 1+ edema Neurological:  unsteady gait, doesn'Franklin know president, not oriented to date ____________________________ MOST RECENT LIPID PANEL 12/15/06  CHOL TOTL 143 mg/dl, LDL 82 NM and HDL 47 mg/dl ____________________________ IMPRESSIONS/PLAN  1. Sick sinus syndrome 2. Chronic atrial fibrillation with slow ventricular response at times 3. Moderate mitral regurgitation 4. Chronic left bundle branch block 5. Severe dementia  Recommendations:  She will go into the hospital for permanent pacemaker insertion. She is a no CODE BLUE per her family. Consent was gained through her son who is agreeable to the procedure and understands there are small risks associated with it. She will hold her Eliquis at least 24 hours prior to the procedure. ____________________________ Cardiology Physician:  Kerry Hough MD [facility]

## 2015-08-01 ENCOUNTER — Telehealth: Payer: Self-pay | Admitting: Cardiovascular Disease

## 2015-08-01 NOTE — Telephone Encounter (Signed)
Follow up      Please call to give info regarding procedure

## 2015-08-01 NOTE — Telephone Encounter (Signed)
New Message    Pt son calling for rn to schedule surgery

## 2015-08-01 NOTE — Telephone Encounter (Signed)
Left message for son to call  Chanetta Marshall, NP 08/01/2015 3:15 PM

## 2015-08-01 NOTE — Telephone Encounter (Signed)
Scheduled with Dr. Curt Bears Thursday. [facility] coordinating and will call son

## 2015-08-01 NOTE — Telephone Encounter (Signed)
Discussed in brief w/ Dr. Sallyanne Kuster - routed at his OK.

## 2015-08-02 ENCOUNTER — Telehealth: Payer: Self-pay | Admitting: Cardiology

## 2015-08-02 NOTE — Telephone Encounter (Signed)
Spoke with patient's son. Reviewed instructions for procedure tomorrow. He is aware that pacemaker implantation may not change quality of life as we are unable to determine symptoms. Risks, benefits of procedure reviewed. Post implant care also reviewed.   Chanetta Marshall, NP 08/02/2015 9:35 AM

## 2015-08-02 NOTE — Telephone Encounter (Signed)
New message     Patient son calling has some question regarding medication and xanax .

## 2015-08-02 NOTE — Telephone Encounter (Signed)
Informed son - ok for patient to take all her medications in the morning, including xanax, except Eliquis.   Instructed not to take Eliquis. He verbalized understanding.

## 2015-08-02 NOTE — Telephone Encounter (Signed)
Returned call, cell number provided rings with no answer, to message saying voice mail box has not been set up. I left message on listed home line for individual to call.

## 2015-08-02 NOTE — Telephone Encounter (Signed)
Follow Up  Pt states that he has not received a call back to discuss this matter on the number provided. Please call [phone] to discuss scheduling the procedure.

## 2015-08-03 ENCOUNTER — Encounter (HOSPITAL_COMMUNITY)
Admission: RE | Disposition: A | Discharge status: Home / Self Care | Payer: Self-pay | Source: Ambulatory Visit | Attending: Cardiology

## 2015-08-03 ENCOUNTER — Encounter (HOSPITAL_COMMUNITY): Payer: Self-pay | Admitting: Cardiology

## 2015-08-03 ENCOUNTER — Ambulatory Visit (HOSPITAL_COMMUNITY)
Admission: RE | Admit: 2015-08-03 | Discharge: 2015-08-04 | Disposition: A | Discharge status: Home / Self Care | Payer: Medicare Other | Source: Ambulatory Visit | Attending: Cardiology | Admitting: Cardiology

## 2015-08-03 DIAGNOSIS — Z7901 Long term (current) use of anticoagulants: Secondary | ICD-10-CM | POA: Diagnosis not present

## 2015-08-03 DIAGNOSIS — I119 Hypertensive heart disease without heart failure: Secondary | ICD-10-CM | POA: Diagnosis not present

## 2015-08-03 DIAGNOSIS — G309 Alzheimer's disease, unspecified: Secondary | ICD-10-CM | POA: Insufficient documentation

## 2015-08-03 DIAGNOSIS — Z95818 Presence of other cardiac implants and grafts: Secondary | ICD-10-CM

## 2015-08-03 DIAGNOSIS — Z96643 Presence of artificial hip joint, bilateral: Secondary | ICD-10-CM | POA: Diagnosis not present

## 2015-08-03 DIAGNOSIS — I441 Atrioventricular block, second degree: Secondary | ICD-10-CM | POA: Diagnosis present

## 2015-08-03 DIAGNOSIS — I482 Chronic atrial fibrillation: Secondary | ICD-10-CM | POA: Insufficient documentation

## 2015-08-03 DIAGNOSIS — F0280 Dementia in other diseases classified elsewhere without behavioral disturbance: Secondary | ICD-10-CM | POA: Insufficient documentation

## 2015-08-03 DIAGNOSIS — Z79899 Other long term (current) drug therapy: Secondary | ICD-10-CM | POA: Insufficient documentation

## 2015-08-03 HISTORY — DX: Atrioventricular block, second degree: I44.1

## 2015-08-03 HISTORY — PX: EP IMPLANTABLE DEVICE: SHX172B

## 2015-08-03 LAB — BASIC METABOLIC PANEL
ANION GAP: 11 (ref 5–15)
BUN: 12 mg/dL (ref 6–20)
CHLORIDE: 103 mmol/L (ref 101–111)
CO2: 26 mmol/L (ref 22–32)
Calcium: 9.1 mg/dL (ref 8.9–10.3)
Creatinine, Ser: 1.02 mg/dL — ABNORMAL HIGH (ref 0.44–1.00)
GFR calc Af Amer: 58 mL/min — ABNORMAL LOW (ref >60)
GFR calc non Af Amer: 50 mL/min — ABNORMAL LOW (ref >60)
GLUCOSE: 100 mg/dL — AB (ref 65–99)
POTASSIUM: 3.4 mmol/L — AB (ref 3.5–5.1)
Sodium: 140 mmol/L (ref 135–145)

## 2015-08-03 LAB — MAGNESIUM: Magnesium: 2.1 mg/dL (ref 1.7–2.4)

## 2015-08-03 LAB — SURGICAL PCR SCREEN
MRSA, PCR: NEGATIVE
STAPHYLOCOCCUS AUREUS: NEGATIVE

## 2015-08-03 SURGERY — PACEMAKER IMPLANT
Anesthesia: LOCAL

## 2015-08-03 MED ORDER — APIXABAN 5 MG PO TABS
5.0000 mg | ORAL_TABLET | Freq: Two times a day (BID) | ORAL | Status: DC
  Administered 2015-08-03 – 2015-08-04 (×2): 5 mg via ORAL
  Filled 2015-08-03 (×2): qty 1

## 2015-08-03 MED ORDER — HEPARIN (PORCINE) IN NACL 2-0.9 UNIT/ML-% IJ SOLN
INTRAMUSCULAR | Status: DC | PRN
  Administered 2015-08-03: 13:00:00

## 2015-08-03 MED ORDER — PANTOPRAZOLE SODIUM 20 MG PO TBEC
20.0000 mg | DELAYED_RELEASE_TABLET | Freq: Every day | ORAL | Status: DC
  Administered 2015-08-03 – 2015-08-04 (×2): 20 mg via ORAL
  Filled 2015-08-03 (×2): qty 1

## 2015-08-03 MED ORDER — LEVOTHYROXINE SODIUM 75 MCG PO TABS
75.0000 ug | ORAL_TABLET | Freq: Every day | ORAL | Status: DC
  Administered 2015-08-04: 75 ug via ORAL
  Filled 2015-08-03: qty 1

## 2015-08-03 MED ORDER — LIDOCAINE HCL (PF) 1 % IJ SOLN
INTRAMUSCULAR | Status: DC | PRN
  Administered 2015-08-03: 30 mL

## 2015-08-03 MED ORDER — MUPIROCIN 2 % EX OINT
TOPICAL_OINTMENT | Freq: Two times a day (BID) | CUTANEOUS | Status: DC
  Administered 2015-08-03 (×2): via NASAL
  Filled 2015-08-03: qty 22

## 2015-08-03 MED ORDER — VITAMIN D 1000 UNITS PO TABS
2000.0000 [IU] | ORAL_TABLET | Freq: Every day | ORAL | Status: DC
  Administered 2015-08-04: 2000 [IU] via ORAL
  Filled 2015-08-03: qty 2

## 2015-08-03 MED ORDER — CHLORHEXIDINE GLUCONATE 4 % EX LIQD: 60.0000 mL | Freq: Once | CUTANEOUS | Status: DC

## 2015-08-03 MED ORDER — SODIUM CHLORIDE 0.9 % IR SOLN
Status: DC | PRN
  Administered 2015-08-03: 13:00:00

## 2015-08-03 MED ORDER — MELATONIN 3 MG PO TABS
1.5000 mg | ORAL_TABLET | Freq: Every day | ORAL | Status: DC
  Administered 2015-08-03: 1.5 mg via ORAL
  Filled 2015-08-03 (×2): qty 0.5

## 2015-08-03 MED ORDER — ACETAMINOPHEN 500 MG PO TABS
500.0000 mg | ORAL_TABLET | Freq: Two times a day (BID) | ORAL | Status: DC | PRN
  Administered 2015-08-03: 500 mg via ORAL
  Filled 2015-08-03: qty 1

## 2015-08-03 MED ORDER — MEMANTINE HCL ER 28 MG PO CP24
28.0000 mg | ORAL_CAPSULE | Freq: Every day | ORAL | Status: DC
  Administered 2015-08-03: 28 mg via ORAL
  Filled 2015-08-03 (×2): qty 1

## 2015-08-03 MED ORDER — SODIUM CHLORIDE 0.9 % IR SOLN
Status: AC
  Filled 2015-08-03: qty 2

## 2015-08-03 MED ORDER — VITAMIN D 50 MCG (2000 UT) PO TABS: 2000.0000 [IU] | ORAL_TABLET | Freq: Every day | ORAL | Status: DC

## 2015-08-03 MED ORDER — CEFAZOLIN SODIUM-DEXTROSE 2-3 GM-% IV SOLR
2.0000 g | INTRAVENOUS | Status: AC
  Administered 2015-08-03: 2 g via INTRAVENOUS

## 2015-08-03 MED ORDER — HEPARIN (PORCINE) IN NACL 2-0.9 UNIT/ML-% IJ SOLN
INTRAMUSCULAR | Status: AC
  Filled 2015-08-03: qty 500

## 2015-08-03 MED ORDER — LIDOCAINE HCL (PF) 1 % IJ SOLN
INTRAMUSCULAR | Status: AC
  Filled 2015-08-03: qty 60

## 2015-08-03 MED ORDER — FUROSEMIDE 40 MG PO TABS
40.0000 mg | ORAL_TABLET | Freq: Every day | ORAL | Status: DC
  Administered 2015-08-03 – 2015-08-04 (×2): 40 mg via ORAL
  Filled 2015-08-03 (×2): qty 1

## 2015-08-03 MED ORDER — SODIUM CHLORIDE 0.9 % IV SOLN
INTRAVENOUS | Status: DC
  Administered 2015-08-03: 11:00:00 via INTRAVENOUS

## 2015-08-03 MED ORDER — CEFAZOLIN SODIUM 1-5 GM-% IV SOLN
1.0000 g | Freq: Four times a day (QID) | INTRAVENOUS | Status: AC
  Administered 2015-08-03 – 2015-08-04 (×3): 1 g via INTRAVENOUS
  Filled 2015-08-03 (×3): qty 50

## 2015-08-03 MED ORDER — SODIUM CHLORIDE 0.9 % IR SOLN
80.0000 mg | Status: DC
  Filled 2015-08-03: qty 2

## 2015-08-03 MED ORDER — LOSARTAN POTASSIUM 50 MG PO TABS
100.0000 mg | ORAL_TABLET | Freq: Every day | ORAL | Status: DC
  Administered 2015-08-03 – 2015-08-04 (×2): 100 mg via ORAL
  Filled 2015-08-03 (×2): qty 2

## 2015-08-03 MED ORDER — TRAZODONE HCL 50 MG PO TABS
25.0000 mg | ORAL_TABLET | Freq: Every day | ORAL | Status: DC
  Administered 2015-08-03: 25 mg via ORAL
  Filled 2015-08-03: qty 1

## 2015-08-03 MED ORDER — MELATONIN 10 MG PO TABS: 1.0000 | ORAL_TABLET | Freq: Every day | ORAL | Status: DC

## 2015-08-03 MED ORDER — ALPRAZOLAM 0.25 MG PO TABS
0.1250 mg | ORAL_TABLET | Freq: Two times a day (BID) | ORAL | Status: DC | PRN
  Administered 2015-08-03: 0.125 mg via ORAL
  Filled 2015-08-03: qty 1

## 2015-08-03 MED ORDER — MUPIROCIN 2 % EX OINT
TOPICAL_OINTMENT | CUTANEOUS | Status: AC
  Filled 2015-08-03: qty 22

## 2015-08-03 MED ORDER — AMLODIPINE BESYLATE 2.5 MG PO TABS
2.5000 mg | ORAL_TABLET | Freq: Every day | ORAL | Status: DC
  Administered 2015-08-03 – 2015-08-04 (×2): 2.5 mg via ORAL
  Filled 2015-08-03 (×2): qty 1

## 2015-08-03 MED ORDER — MAGNESIUM SULFATE 2 GM/50ML IV SOLN
2.0000 g | Freq: Once | INTRAVENOUS | Status: AC
  Administered 2015-08-03: 2 g via INTRAVENOUS
  Filled 2015-08-03: qty 50

## 2015-08-03 MED ORDER — ONDANSETRON HCL 4 MG/2ML IJ SOLN: 4.0000 mg | Freq: Four times a day (QID) | INTRAMUSCULAR | Status: DC | PRN

## 2015-08-03 SURGICAL SUPPLY — 7 items
CABLE SURGICAL S-101-97-12 (CABLE) ×2 IMPLANT
LEAD CAPSURE NOVUS 5076-52CM (Lead) ×1 IMPLANT
PAD DEFIB LIFELINK (PAD) ×1 IMPLANT
PPM SENSIA SR SESR01 (Pacemaker) ×1 IMPLANT
SHEATH CLASSIC 7F (SHEATH) ×1 IMPLANT
TRAY PACEMAKER INSERTION (PACKS) ×1 IMPLANT
WIRE HI TORQ VERSACORE-J 145CM (WIRE) ×1 IMPLANT

## 2015-08-03 NOTE — Discharge Instructions (Signed)
° ° °  Supplemental Discharge Instructions for  Pacemaker/Defibrillator Patients  Activity No heavy lifting or vigorous activity with your left/right arm for 6 to 8 weeks.  Do not raise your left/right arm above your head for one week.  Gradually raise your affected arm as drawn below.           __    08/07/15                      08/08/15                     08/09/15                     08/10/15   WOUND CARE - Keep the wound area clean and dry.  Do not get this area wet for one week. No showers for one week; you may shower on 08/10/15    . - The tape/steri-strips on your wound will fall off; do not pull them off.  No bandage is needed on the site.  DO  NOT apply any creams, oils, or ointments to the wound area. - If you notice any drainage or discharge from the wound, any swelling or bruising at the site, or you develop a fever > 101? F after you are discharged home, call the office at once.  Special Instructions - You are still able to use cellular telephones; use the ear opposite the side where you have your pacemaker/defibrillator.  Avoid carrying your cellular phone near your device. - When traveling through airports, show security personnel your identification card to avoid being screened in the metal detectors.  Ask the security personnel to use the hand wand. - Avoid arc welding equipment, MRI testing (magnetic resonance imaging), TENS units (transcutaneous nerve stimulators).  Call the office for questions about other devices. - Avoid electrical appliances that are in poor condition or are not properly grounded. - Microwave ovens are safe to be near or to operate.

## 2015-08-03 NOTE — Discharge Summary (Signed)
ELECTROPHYSIOLOGY PROCEDURE DISCHARGE SUMMARY    Patient ID: [name],  MRN: [id number], DOB/AGE: Oct 30, 1932 82 y.o.  Admit date: 08/03/2015 Discharge date: 08/04/2015  Primary Care Physician: Mathews Argyle, MD Primary Cardiologist: Wynonia Lawman Electrophysiologist: Curt Bears  Primary Discharge Diagnosis:  Symptomatic bradycardia status post pacemaker implantation this admission  Secondary Discharge Diagnosis:  1.  Permanent atrial fibrillation 2.  Dementia 3.  Hypertension   Allergies  Allergen Reactions  . Dilaudid [Hydromorphone Hcl] Other (See Comments)    hallucinations  . Vicodin [Hydrocodone-Acetaminophen] Other (See Comments)    hallucinations     Procedures This Admission:  1.  Implantation of a MDT single chamber PPM on 08/03/15 by Dr Curt Bears.  The patient received a MDT model number [id number] with model number [id number] right ventricular lead. There were no immediate post procedure complications. 2.  CXR on 08/04/15 demonstrated no pneumothorax status post device implantation.   Brief HPI: [name] is a 82 y.o. female was referred to electrophysiology for consideration of PPM implantation.  Past medical history includes permanent atrial fibrillation, hypertension, dementia.  She has been found to have bradycardia with heart rates in the 30's as well as increased unsteadiness on her feet.  Risks, benefits, and alternatives to PPM implantation were reviewed with the patient who wished to proceed.   Hospital Course:  The patient was admitted and underwent implantation of a MDT single chamber pacemaker with details as outlined above.  She  was monitored on telemetry overnight which demonstrated atrial fibrillation with ventricular pacing.  Left chest was without hematoma or ecchymosis.  The device was interrogated and found to be functioning normally.  CXR was obtained and demonstrated no pneumothorax status post device implantation.  Wound care, arm  mobility, and restrictions were reviewed with the patient.  The patient was examined by Dr Curt Bears and considered stable for discharge to home.   Eliquis dose decreased to 2.5mg  bid with age >34 and weight <60kg.    Physical Exam: Filed Vitals:   08/03/15 1705 08/03/15 1756 08/04/15 0512 08/04/15 0800  BP: 183/66 173/75 164/67   Pulse: 61     Temp:  99.1 F (37.3 C) 97.4 F (36.3 C)   TempSrc:  Oral Axillary   Resp: 18 17 15    Height:  5\' 6"  (1.676 m)    Weight:  131 lb 9.8 oz (59.7 kg) 128 lb 6.4 oz (58.242 kg)   SpO2: 98% 96% 98% 97%    Labs:   Lab Results  Component Value Date   WBC 6.6 07/29/2015   HGB 14.3 07/29/2015   HCT 42.0 07/29/2015   MCV 86.2 07/29/2015   PLT 211 07/29/2015     Recent Labs Lab 08/03/15 2202  NA 140  K 3.4*  CL 103  CO2 26  BUN 12  CREATININE 1.02*  CALCIUM 9.1  GLUCOSE 100*    Discharge Medications:    Medication List    TAKE these medications        acetaminophen 500 MG tablet  Commonly known as:  TYLENOL  Take 500 mg by mouth 2 (two) times daily as needed for moderate pain.     ALPRAZolam 0.25 MG tablet  Commonly known as:  XANAX  Take 0.125 mg by mouth 2 (two) times daily as needed for anxiety.     amLODipine 2.5 MG tablet  Commonly known as:  NORVASC  Take 1 tablet by mouth daily.     apixaban 2.5 MG Tabs tablet  Commonly  known as:  ELIQUIS  Take 1 tablet (2.5 mg total) by mouth 2 (two) times daily.     CALCIUM + D PO  Take 1 tablet by mouth 2 (two) times daily.     furosemide 40 MG tablet  Commonly known as:  LASIX  Take 1 tablet (40 mg total) by mouth daily.     lansoprazole 30 MG capsule  Commonly known as:  PREVACID  Take 30 mg by mouth daily at 12 noon.     levothyroxine 75 MCG tablet  Commonly known as:  SYNTHROID, LEVOTHROID  Take 75 mcg by mouth daily before breakfast.     losartan 100 MG tablet  Commonly known as:  COZAAR  Take 100 mg by mouth daily.     Melatonin 10 MG Tabs  Take 1 tablet  by mouth at bedtime.     multivitamin with minerals Tabs tablet  Take 1 tablet by mouth daily.     NAMENDA XR 28 MG Cp24 24 hr capsule  Generic drug:  memantine  Take 28 mg by mouth at bedtime.     rivastigmine 9.5 mg/24hr  Commonly known as:  EXELON  Place 1 patch onto the skin daily.     traZODone 50 MG tablet  Commonly known as:  DESYREL  Take 25 mg by mouth at bedtime.     Vitamin D 2000 units tablet  Take 2,000 Units by mouth daily.        Disposition:  Discharge Instructions    Diet - low sodium heart healthy    Complete by:  As directed      Increase activity slowly    Complete by:  As directed           Follow-up Information    Follow up with [facility] On 08/21/2015.   Specialty:  Cardiology   Why:  at [facility] for wound check    Contact information:   [address], [place] [phone]      Follow up with Adara Kittle Meredith Leeds, MD On 11/06/2015.   Specialty:  Cardiology   Why:  at [facility] information:   [place] [address] [phone]       Duration of Discharge Encounter: Greater than 30 minutes including physician time.  Signed, Chanetta Marshall, NP 08/04/2015 10:15 AM   I have seen and examined this patient with Chanetta Marshall.  Agree with above, note added to reflect my findings.  On exam, regular rhythm, no murmurs, lungs clear.  Had heart block and pacemaker placed.  Feeling well this AM with stable lead parameters on interrogation and CXR without abnormality.  Senya Hinzman have her follow up in device clinic in 10 days.    Hellon Vaccarella M. Audwin Semper MD 08/04/2015 1:14 PM

## 2015-08-03 NOTE — H&P (Signed)
Reason for Consult: Bradycardia  Requesting Physician: Regenia Skeeter  Cardiologist: Wynonia Lawman  HPI: This is a 82 y.o. female with a past medical history significant for permanent atrial fibrillation and mild-moderate Alzheimer's dementia. She was brought to ED by her son due to very low pulse recordings, but she did not describe dizziness, fatigue and has not experienced syncope. There was some concern for bradycardia ass an outpatient, but an event monitor performed by Dr. Wynonia Lawman did not show any.Marland Kitchen Here in the ED she has atrial fibrillation with slow ventricular response (40-60), occasionally with further bradycardia and idioventricular escape beats, HR as slow as 28-30 bpm. I was talking to her during such episodes and she was asymptomatic, unaware of any problem. She was able to walk to the bathroom and back and heart rate briefly increased to 55 bpm.  She does not take cardiac meds that are negative chronotropes. She has been on exelon and namenda for at least 2 years. No other reversible cause of bradycardia/worsening AV block is identified and the problem is probably age related.  She has had episodes of severe sundowning and confusion, especially during previous hospitalizations.  PMHx:  Past Medical History  Diagnosis Date  . Hypertension   . Atrial fibrillation ([place])   . Dementia     mild  . Heart murmur   . Arthritis   . Acute congestive heart failure ([place]) 04/19/2014  . Chronic anticoagulation    Past Surgical History  Procedure Laterality Date  . Total hip arthroplasty Bilateral     Left in 2007, right in 2001  . Colonoscopy  2002    Remote history of polypectomy    FAMHx: Family History  Problem Relation Age of Onset  . Pneumonia Mother 68    SOCHx:  reports that she has never smoked. She has never used smokeless tobacco. She reports that she does not drink alcohol or use illicit drugs.  ALLERGIES: Allergies   Allergen Reactions  . Dilaudid [Hydromorphone Hcl] Other (See Comments)    hallucinations  . Vicodin [Hydrocodone-Acetaminophen] Other (See Comments)    hallucinations    ROS: Review of systems not obtained due to patient factors. Her son fills in the history  HOME MEDICATIONS: No current facility-administered medications on file prior to encounter.   Current Outpatient Prescriptions on File Prior to Encounter  Medication Sig Dispense Refill  . acetaminophen (TYLENOL) 500 MG tablet Take 500 mg by mouth 2 (two) times daily as needed for moderate pain.    Marland Kitchen ALPRAZolam (XANAX) 0.25 MG tablet Take 0.125 mg by mouth 2 (two) times daily as needed for anxiety.    Marland Kitchen apixaban (ELIQUIS) 5 MG TABS tablet Take 5 mg by mouth 2 (two) times daily.    . Calcium Carbonate-Vitamin D (CALCIUM + D PO) Take 1 tablet by mouth 2 (two) times daily.    . Cholecalciferol (VITAMIN D) 2000 UNITS tablet Take 2,000 Units by mouth daily.    . furosemide (LASIX) 40 MG tablet Take 1 tablet (40 mg total) by mouth daily. 60 tablet 12  . losartan (COZAAR) 100 MG tablet Take 100 mg by mouth daily.    . Memantine HCl ER (NAMENDA XR) 28 MG CP24 Take 28 mg by mouth at bedtime.    . Multiple Vitamin (MULTIVITAMIN WITH MINERALS) TABS Take 1 tablet by mouth daily.    . rivastigmine (EXELON) 9.5 mg/24hr Place 1 patch onto the skin daily.       VITALS: Blood pressure 151/62, pulse 42, temperature 97.7 F (  36.5 C), temperature source Oral, resp. rate 12, SpO2 98 %.  PHYSICAL EXAM:  General: Alert, oriented x3, no distress Head: no evidence of trauma, PERRL, EOMI, no exophtalmos or lid lag, no myxedema, no xanthelasma; normal ears, nose and oropharynx Neck: normal jugular venous pulsations and no hepatojugular reflux; brisk carotid pulses without delay and no carotid bruits Chest: clear to auscultation, no signs of consolidation by percussion or  palpation, normal fremitus, symmetrical and full respiratory excursions Cardiovascular: normal position and quality of the apical impulse, slow irregular rhythm, normal first heart sound and second heart sound, no rubs or gallops, intermittent 1/6 systolic murmur Abdomen: no tenderness or distention, no masses by palpation, no abnormal pulsatility or arterial bruits, normal bowel sounds, no hepatosplenomegaly Extremities: no clubbing, cyanosis; no edema; 2+ radial, ulnar and brachial pulses bilaterally; 2+ right femoral, posterior tibial and dorsalis pedis pulses; 2+ left femoral, posterior tibial and dorsalis pedis pulses; no subclavian or femoral bruits Neurological: grossly nonfocal   LABS  CBC  Recent Labs (last 2 labs)      Recent Labs  07/29/15 1130 07/29/15 1134  WBC 6.6 --   NEUTROABS 4.3 --   HGB 12.9 14.3  HCT 39.4 42.0  MCV 86.2 --   PLT 211 --      Basic Metabolic Panel  Recent Labs (last 2 labs)      Recent Labs  07/29/15 1130 07/29/15 1134  NA 139 139  K 4.3 4.0  CL 103 101  CO2 26 --   GLUCOSE 93 89  BUN 14 15  CREATININE 0.97 0.90  CALCIUM 9.5 --   MG 2.2 --     Thyroid Function Tests  Recent Labs (last 2 labs)      Recent Labs  07/29/15 1130  TSH 2.276        IMAGING:  Imaging Results (Last 48 hours)    Dg Chest Portable 1 View  07/29/2015 CLINICAL DATA: Shortness of breath and bradycardia EXAM: PORTABLE CHEST - 1 VIEW COMPARISON: 06/08/2015 FINDINGS: The heart size and mediastinal contours are within normal limits. Both lungs are clear. The visualized skeletal structures are unremarkable. IMPRESSION: No active disease. Electronically Signed By: Inez Catalina M.D. On: 07/29/2015 12:00     ECG: atrial fibrillation with slow response, occ. idioventricular escape  TELEMETRY: atrial fibrillation with slow response, occ. idioventricular escape  IMPRESSION: 1. Advanced  second degree AV block - she [place] need a single chamber pacemaker. Since she is asymptomatic, this is not an emergency. It may be 72 hours until this is done. In the mean time, hospital admission is probably more perilous than home care, in view of her history of sundowning. Discussed with patient, son and Dr. Wynonia Lawman. Plan DC from ED and elective outpatient single chamber pacemaker early next week. Discussed risks/benefits/complications. She has an appt with Dr. Wynonia Lawman Monday afternoon. 2. Atrial fibrillation, CHADSVasc at least 4. Continue Eliquis, plan to hold 24-48 hours before procedure. 3. Hypertensive heart disease with diastolic dysfunction, no signs of acute HF.   Time Spent Directly with Patient: 60 minutes  Sanda Klein, MD, [facility] [phone] office [phone] pager   07/29/2015, 1:26 PM       Patient with advanced second degree AV block.  Plan for pacemaker today.  Discussed the risks and benefits of the procedure with the patient.  Risks include bleeding, infection, tamponade, pneumothorax.  Patient understands these risks and wishes to proceed.

## 2015-08-04 ENCOUNTER — Ambulatory Visit (HOSPITAL_COMMUNITY): Payer: Medicare Other

## 2015-08-04 DIAGNOSIS — I482 Chronic atrial fibrillation: Secondary | ICD-10-CM | POA: Diagnosis not present

## 2015-08-04 DIAGNOSIS — Z7901 Long term (current) use of anticoagulants: Secondary | ICD-10-CM | POA: Diagnosis not present

## 2015-08-04 DIAGNOSIS — F0280 Dementia in other diseases classified elsewhere without behavioral disturbance: Secondary | ICD-10-CM | POA: Diagnosis not present

## 2015-08-04 DIAGNOSIS — I119 Hypertensive heart disease without heart failure: Secondary | ICD-10-CM | POA: Diagnosis not present

## 2015-08-04 DIAGNOSIS — G309 Alzheimer's disease, unspecified: Secondary | ICD-10-CM | POA: Diagnosis not present

## 2015-08-04 DIAGNOSIS — Z950 Presence of cardiac pacemaker: Secondary | ICD-10-CM | POA: Diagnosis not present

## 2015-08-04 DIAGNOSIS — I441 Atrioventricular block, second degree: Secondary | ICD-10-CM | POA: Diagnosis not present

## 2015-08-04 MED ORDER — APIXABAN 2.5 MG PO TABS: 2.5000 mg | ORAL_TABLET | Freq: Two times a day (BID) | ORAL | Status: DC

## 2015-08-04 NOTE — Progress Notes (Signed)
Patient being discharged home per MD order. All discharge instructions given to patient, patients son and daughter. Paper copy of all instructions given to patient.

## 2015-08-04 NOTE — Plan of Care (Signed)
Problem: Safety: Goal: Ability to remain free from injury will improve Outcome: Progressing Patient has a history of dementia; bed alarm is in use to ensure patient safety. Patient's family has remained at her bedside to help ensure her safety. Patient's left arm is in a sling due to recent pacemaker placement.  Problem: Pain Managment: Goal: General experience of comfort will improve Outcome: Progressing Patient is experiencing some discomfort at the pacemaker insertion site which has been relieved with Tylenol.   Problem: Physical Regulation: Goal: Ability to maintain clinical measurements within normal limits will improve Outcome: Progressing Patients vital signs are within normal limits. Patient's heart rate is stable after pacemaker insertion; patient is V-paced on the monitor. Goal: Will remain free from infection Outcome: Progressing Patient is receiving prophylactic ancef after pacemaker insertion.  Problem: Activity: Goal: Risk for activity intolerance will decrease Outcome: Completed/Met Date Met:  08/04/15 Patient is able to ambulate in her room with staff assistance to ensure safety. She has not experienced activity intolerance.      

## 2015-08-07 ENCOUNTER — Encounter: Payer: Self-pay | Admitting: Cardiology

## 2015-08-10 ENCOUNTER — Encounter: Payer: Self-pay | Admitting: Cardiology

## 2015-08-11 ENCOUNTER — Encounter: Payer: Self-pay | Admitting: *Deleted

## 2015-08-11 DIAGNOSIS — G301 Alzheimer's disease with late onset: Secondary | ICD-10-CM | POA: Diagnosis not present

## 2015-08-11 DIAGNOSIS — I10 Essential (primary) hypertension: Secondary | ICD-10-CM | POA: Diagnosis not present

## 2015-08-11 NOTE — Telephone Encounter (Signed)
Called patient's son informing him that fax was sent to [place] for [employer], allowing mom to return.

## 2015-08-21 ENCOUNTER — Ambulatory Visit (INDEPENDENT_AMBULATORY_CARE_PROVIDER_SITE_OTHER): Payer: Medicare Other | Admitting: *Deleted

## 2015-08-21 ENCOUNTER — Encounter: Payer: Self-pay | Admitting: Cardiology

## 2015-08-21 DIAGNOSIS — Z950 Presence of cardiac pacemaker: Secondary | ICD-10-CM

## 2015-08-21 LAB — CUP PACEART INCLINIC DEVICE CHECK
Battery Impedance: 100 Ohm
Date Time Interrogation Session: 20170213151948
Implantable Lead Implant Date: 20170126
Implantable Lead Location: 753860
Implantable Lead Model: 5076
Lead Channel Pacing Threshold Amplitude: 0.75 V
Lead Channel Pacing Threshold Pulse Width: 0.4 ms
Lead Channel Pacing Threshold Pulse Width: 0.4 ms
Lead Channel Setting Pacing Amplitude: 3.5 V
Lead Channel Setting Pacing Pulse Width: 0.4 ms
MDC IDC MSMT BATTERY REMAINING LONGEVITY: 105 mo
MDC IDC MSMT BATTERY VOLTAGE: 2.78 V
MDC IDC MSMT LEADCHNL RA IMPEDANCE VALUE: 0 Ohm
MDC IDC MSMT LEADCHNL RV IMPEDANCE VALUE: 602 Ohm
MDC IDC MSMT LEADCHNL RV PACING THRESHOLD AMPLITUDE: 0.75 V
MDC IDC SET LEADCHNL RV SENSING SENSITIVITY: 2.8 mV
MDC IDC STAT BRADY RV PERCENT PACED: 99 %

## 2015-08-21 NOTE — Progress Notes (Signed)
Wound check appointment. Steri-strips removed. Wound without redness or edema. Incision edges approximated, wound well healed. Normal device function. Threshold and impedance consistent with implant measurements. Device programmed at 3.5V for extra safety margin until 3 month visit. Histogram distribution appropriate for patient and level of activity. No high ventricular rates noted. Patient educated about wound care, arm mobility, lifting restrictions. ROV with WC 11/06/15 then transfer care to Dr. Wynonia Lawman per pt request.

## 2015-11-06 ENCOUNTER — Encounter: Payer: Self-pay | Admitting: Cardiology

## 2015-11-06 ENCOUNTER — Ambulatory Visit (INDEPENDENT_AMBULATORY_CARE_PROVIDER_SITE_OTHER): Payer: Medicare Other | Admitting: Cardiology

## 2015-11-06 VITALS — BP 130/78 | HR 62 | Ht 67.0 in | Wt 140.0 lb

## 2015-11-06 DIAGNOSIS — R001 Bradycardia, unspecified: Secondary | ICD-10-CM

## 2015-11-06 DIAGNOSIS — Z950 Presence of cardiac pacemaker: Secondary | ICD-10-CM

## 2015-11-06 LAB — CUP PACEART INCLINIC DEVICE CHECK
Battery Impedance: 131 Ohm
Brady Statistic RV Percent Paced: 99 %
Implantable Lead Implant Date: 20170126
Implantable Lead Location: 753860
Lead Channel Impedance Value: 0 Ohm
Lead Channel Pacing Threshold Amplitude: 1 V
Lead Channel Pacing Threshold Amplitude: 1 V
Lead Channel Pacing Threshold Pulse Width: 0.4 ms
Lead Channel Setting Pacing Amplitude: 2.5 V
MDC IDC MSMT BATTERY REMAINING LONGEVITY: 111 mo
MDC IDC MSMT BATTERY VOLTAGE: 2.78 V
MDC IDC MSMT LEADCHNL RV IMPEDANCE VALUE: 572 Ohm
MDC IDC MSMT LEADCHNL RV PACING THRESHOLD PULSEWIDTH: 0.4 ms
MDC IDC SESS DTM: 20170501161035
MDC IDC SET LEADCHNL RV PACING PULSEWIDTH: 0.4 ms
MDC IDC SET LEADCHNL RV SENSING SENSITIVITY: 2.8 mV

## 2015-11-06 MED ORDER — METOPROLOL TARTRATE 25 MG PO TABS: 25.0000 mg | ORAL_TABLET | Freq: Two times a day (BID) | ORAL | Status: DC

## 2015-11-06 NOTE — Patient Instructions (Addendum)
Medication Instructions:  Your physician has recommended you make the following change in your medication:  1) START Metoprolol Tartrate 25 mg twice daily  Labwork: None ordered  Testing/Procedures: None ordered  Follow-Up: Remote monitoring is used to monitor your Pacemaker of ICD from home. This monitoring reduces the number of office visits required to check your device to one time per year. It allows [place] to keep an eye on the functioning of your device to ensure it is working properly. You are scheduled for a device check from home on 02/05/16. You may send your transmission at any time that day. If you have a wireless device, the transmission will be sent automatically. After your physician reviews your transmission, you will receive a postcard with your next transmission date.  Your physician wants you to follow-up in: 9 months with Dr. Curt Bears. You will receive a reminder letter in the mail two months in advance. If you don't receive a letter, please call our office to schedule the follow-up appointment.  If you need a refill on your cardiac medications before your next appointment, please call your pharmacy.  Thank you for choosing [facility]!!   Trinidad Curet, RN [phone]   Any Other Special Instructions Will Be Listed Below (If Applicable).  Metoprolol tablets What is this medicine? METOPROLOL (me TOE proe lole) is a beta-blocker. Beta-blockers reduce the workload on the heart and help it to beat more regularly. This medicine is used to treat high blood pressure and to prevent chest pain. It is also used to after a heart attack and to prevent an additional heart attack from occurring. This medicine may be used for other purposes; ask your health care provider or pharmacist if you have questions. What should I tell my health care provider before I take this medicine? They need to know if you have any of these conditions: -diabetes -heart or vessel disease like slow  heart rate, worsening heart failure, heart block, sick sinus syndrome or Raynaud's disease -kidney disease -liver disease -lung or breathing disease, like asthma or emphysema -pheochromocytoma -thyroid disease -an unusual or allergic reaction to metoprolol, other beta-blockers, medicines, foods, dyes, or preservatives -pregnant or trying to get pregnant -breast-feeding How should I use this medicine? Take this medicine by mouth with a drink of water. Follow the directions on the prescription label. Take this medicine immediately after meals. Take your doses at regular intervals. Do not take more medicine than directed. Do not stop taking this medicine suddenly. This could lead to serious heart-related effects. Talk to your pediatrician regarding the use of this medicine in children. Special care may be needed. Overdosage: If you think you have taken too much of this medicine contact a poison control center or emergency room at once. NOTE: This medicine is only for you. Do not share this medicine with others. What if I miss a dose? If you miss a dose, take it as soon as you can. If it is almost time for your next dose, take only that dose. Do not take double or extra doses. What may interact with this medicine? This medicine may interact with the following medications: -certain medicines for blood pressure, heart disease, irregular heart beat -certain medicines for depression like monoamine oxidase (MAO) inhibitors, fluoxetine, or paroxetine -clonidine -dobutamine -epinephrine -isoproterenol -reserpine This list may not describe all possible interactions. Give your health care provider a list of all the medicines, herbs, non-prescription drugs, or dietary supplements you use. Also tell them if you smoke, drink  alcohol, or use illegal drugs. Some items may interact with your medicine. What should I watch for while using this medicine? Visit your doctor or health care professional for regular  check ups. Contact your doctor right away if your symptoms worsen. Check your blood pressure and pulse rate regularly. Ask your health care professional what your blood pressure and pulse rate should be, and when you should contact them. You may get drowsy or dizzy. Do not drive, use machinery, or do anything that needs mental alertness until you know how this medicine affects you. Do not sit or stand up quickly, especially if you are an older patient. This reduces the risk of dizzy or fainting spells. Contact your doctor if these symptoms continue. Alcohol may interfere with the effect of this medicine. Avoid alcoholic drinks. What side effects may I notice from receiving this medicine? Side effects that you should report to your doctor or health care professional as soon as possible: -allergic reactions like skin rash, itching or hives -cold or numb hands or feet -depression -difficulty breathing -faint -fever with sore throat -irregular heartbeat, chest pain -rapid weight gain -swollen legs or ankles Side effects that usually do not require medical attention (report to your doctor or health care professional if they continue or are bothersome): -anxiety or nervousness -change in sex drive or performance -dry skin -headache -nightmares or trouble sleeping -short term memory loss -stomach upset or diarrhea -unusually tired This list may not describe all possible side effects. Call your doctor for medical advice about side effects. You may report side effects to FDA at 1-800-FDA-1088. Where should I keep my medicine? Keep out of the reach of children. Store at room temperature between 15 and 30 degrees C (59 and 86 degrees F). Throw away any unused medicine after the expiration date. NOTE: This sheet is a summary. It may not cover all possible information. If you have questions about this medicine, talk to your doctor, pharmacist, or health care provider.    2016, Elsevier/Gold Standard.  (2013-02-26 14:40:36)

## 2015-11-06 NOTE — Progress Notes (Signed)
Electrophysiology Office Note   Date:  11/06/2015   ID:  [name] Franklin, DOB May 15, 1933, MRN [id number]  PCP:  [name] Argyle, MD  Primary Electrophysiologist:  [name] Haw, MD    No chief complaint on file.    History of Present Illness: [name] Franklin is a 82 y.o. female who presents today for electrophysiology evaluation.   She has a history of atrial fibrillation.  She was found to be bradycardic and unsteady on her feet and was brought to the hospital in January.  She received a dual chamber pacemaker at that time.     Today, she denies symptoms of palpitations, chest pain, shortness of breath, orthopnea, PND, lower extremity edema, claudication, dizziness, presyncope, syncope, bleeding, or neurologic sequela. The patient is tolerating medications without difficulties and is otherwise without complaint today.   She has been feeling quite a bit improved since her device was implanted. She did have one episode of tachycardia recorded by her device that possibly ventricular in origin.  It was a 20 beat run.   Past Medical History  Diagnosis Date  . Hypertension   . Atrial fibrillation ([place])   . Dementia     mild  . Heart murmur   . Arthritis   . Acute congestive heart failure ([place]) 04/19/2014  . Chronic anticoagulation    Past Surgical History  Procedure Laterality Date  . Total hip arthroplasty Bilateral     Left in 2007, right in 2001  . Colonoscopy  2002    Remote history of polypectomy  . Ep implantable device N/A 08/03/2015    Procedure: Pacemaker Implant;  Surgeon: Crystal Cue Meredith Leeds, MD;  Location: [facility];  Service: Cardiovascular;  Laterality: N/A;     Current Outpatient Prescriptions  Medication Sig Dispense Refill  . acetaminophen (TYLENOL) 500 MG tablet Take 500 mg by mouth 2 (two) times daily as needed for moderate pain.    Marland Kitchen ALPRAZolam (XANAX) 0.25 MG tablet Take 0.125 mg by mouth 2 (two) times daily as needed for anxiety.     Marland Kitchen amLODipine (NORVASC) 2.5 MG tablet Take 5 mg by mouth daily.   12  . apixaban (ELIQUIS) 2.5 MG TABS tablet Take 1 tablet (2.5 mg total) by mouth 2 (two) times daily. 60 tablet 3  . Calcium Carbonate-Vitamin D (CALCIUM + D PO) Take 1 tablet by mouth 2 (two) times daily.    . Cholecalciferol (VITAMIN D) 2000 UNITS tablet Take 2,000 Units by mouth daily.    . furosemide (LASIX) 40 MG tablet Take 1 tablet (40 mg total) by mouth daily. 60 tablet 12  . lansoprazole (PREVACID) 30 MG capsule Take 30 mg by mouth daily at 12 noon.    Marland Kitchen levothyroxine (SYNTHROID, LEVOTHROID) 75 MCG tablet Take 75 mcg by mouth daily before breakfast.    . losartan (COZAAR) 100 MG tablet Take 100 mg by mouth daily.    . Melatonin 10 MG TABS Take 1 tablet by mouth at bedtime.    . Memantine HCl ER (NAMENDA XR) 28 MG CP24 Take 28 mg by mouth at bedtime. Reported on 08/21/2015    . Multiple Vitamin (MULTIVITAMIN WITH MINERALS) TABS Take 1 tablet by mouth daily.    . rivastigmine (EXELON) 9.5 mg/24hr Place 1 patch onto the skin daily.    . traZODone (DESYREL) 50 MG tablet Take 25 mg by mouth at bedtime.     No current facility-administered medications for this visit.    Allergies:   Dilaudid  and Vicodin   Social History:  The patient  reports that she has never smoked. She has never used smokeless tobacco. She reports that she does not drink alcohol or use illicit drugs.   Family History:  The patient's family history includes Pneumonia (age of onset: 24) in her mother.    ROS:  Please see the history of present illness.   Otherwise, review of systems is positive for none.   All other systems are reviewed and negative.    PHYSICAL EXAM: VS:  There were no vitals taken for this visit. , BMI There is no weight on file to calculate BMI. GEN: Well nourished, well developed, in no acute distress HEENT: normal Neck: no JVD, carotid bruits, or masses Cardiac: RRR; no murmurs, rubs, or gallops,no edema  Respiratory:   clear to auscultation bilaterally, normal work of breathing GI: soft, nontender, nondistended, + BS MS: no deformity or atrophy Skin: warm and dry,  device pocket is well healed Neuro:  Strength and sensation are intact Psych: euthymic mood, full affect  EKG:  EKG is ordered today. The ekg ordered today shows atrial fibrillation, V paced   Device interrogation is reviewed today in detail.  See PaceArt for details.   Recent Labs: 07/29/2015: Hemoglobin 14.3; Platelets 211; TSH 2.276 08/03/2015: BUN 12; Creatinine, Ser 1.02*; Magnesium 2.1; Potassium 3.4*; Sodium 140    Lipid Panel  No results found for: CHOL, TRIG, HDL, CHOLHDL, VLDL, LDLCALC, LDLDIRECT   Wt Readings from Last 3 Encounters:  08/04/15 128 lb 6.4 oz (58.242 kg)  04/22/14 123 lb 1.6 oz (55.838 kg)  08/01/12 140 lb 14 oz (63.9 kg)      Other studies Reviewed: Additional studies/ records that were reviewed today include: TTE 2015  Review of the above records today demonstrates:  - Left ventricle: The cavity size was normal. Systolic function was mildly reduced. The estimated ejection fraction was in the range of 45% to 50%. Wall motion was normal; there were no regional wall motion abnormalities. - Ventricular septum: Septal motion showed paradox. - Aortic valve: Mild diffuse thickening and calcification, consistent with sclerosis. There was mild regurgitation. - Mitral valve: There was moderate regurgitation. - Left atrium: The atrium was severely dilated. - Pulmonic valve: There was mild regurgitation. - Pulmonary arteries: PA peak pressure: 35 mm Hg (S). - Pericardium, extracardiac: A small, free-flowing pericardial effusion was identified. The fluid had no internal echoes.There was no evidence of hemodynamic compromise. There was a left pleural effusion.   ASSESSMENT AND PLAN:  1.  Atrial fibrillation: Had pacemaker placed for significant bradycardia and symptoms during atrial  fibrillation.  Anticoagulated with Eliquis.   She did have a short run of tachycardia which could be ventricular in origin. [name] Franklin add 25 and metoprolol twice daily. She is also I per tensive at time prior to taking her medications. I have asked her to take her Norvasc in the evening insulin in the morning.  This patients CHA2DS2-VASc Score and unadjusted Ischemic Stroke Rate (% per year) is equal to 4.8 % stroke rate/year from a score of 4  Above score calculated as 1 point each if present [CHF, HTN, DM, Vascular=MI/PAD/Aortic Plaque, Age if 65-74, or Female] Above score calculated as 2 points each if present [Age > 75, or Stroke/TIA/TE]  2.  Advanced second degree AV block:   Single-chamber RV pacemaker placed. Functioning appropriately. No changes necessary today.     Current medicines are reviewed at length with the patient today.   The  patient no concerns regarding her medicines.  The following changes were made today:  Add metoprolol 25 mg BID, take Norvasc in the PM  Labs/ tests ordered today include:  No orders of the defined types were placed in this encounter.     Disposition:   FU with Emalene Welte 9 months  Signed, Calix Heinbaugh Meredith Leeds, MD  11/06/2015 8:06 AM     [facility] [address] [place] [address] [phone] (office) [phone] (fax)

## 2015-11-08 DIAGNOSIS — I10 Essential (primary) hypertension: Secondary | ICD-10-CM | POA: Diagnosis not present

## 2015-11-08 DIAGNOSIS — I272 Other secondary pulmonary hypertension: Secondary | ICD-10-CM | POA: Diagnosis not present

## 2015-11-08 DIAGNOSIS — I509 Heart failure, unspecified: Secondary | ICD-10-CM | POA: Diagnosis not present

## 2015-11-08 DIAGNOSIS — I480 Paroxysmal atrial fibrillation: Secondary | ICD-10-CM | POA: Diagnosis not present

## 2015-11-08 DIAGNOSIS — G301 Alzheimer's disease with late onset: Secondary | ICD-10-CM | POA: Diagnosis not present

## 2015-11-18 ENCOUNTER — Other Ambulatory Visit: Payer: Self-pay | Admitting: Nurse Practitioner

## 2016-02-05 ENCOUNTER — Ambulatory Visit (INDEPENDENT_AMBULATORY_CARE_PROVIDER_SITE_OTHER): Payer: Medicare Other | Admitting: *Deleted

## 2016-02-05 DIAGNOSIS — R001 Bradycardia, unspecified: Secondary | ICD-10-CM | POA: Diagnosis not present

## 2016-02-05 DIAGNOSIS — Z950 Presence of cardiac pacemaker: Secondary | ICD-10-CM

## 2016-02-05 NOTE — Progress Notes (Signed)
Remote pacemaker transmission.   

## 2016-02-09 LAB — CUP PACEART REMOTE DEVICE CHECK
Battery Impedance: 155 Ohm
Brady Statistic RV Percent Paced: 99 %
Lead Channel Impedance Value: 0 Ohm
Lead Channel Impedance Value: 564 Ohm
Lead Channel Setting Sensing Sensitivity: 2 mV
MDC IDC LEAD IMPLANT DT: 20170126
MDC IDC LEAD LOCATION: 753860
MDC IDC MSMT BATTERY REMAINING LONGEVITY: 108 mo
MDC IDC MSMT BATTERY VOLTAGE: 2.78 V
MDC IDC MSMT LEADCHNL RV PACING THRESHOLD AMPLITUDE: 0.75 V
MDC IDC MSMT LEADCHNL RV PACING THRESHOLD PULSEWIDTH: 0.4 ms
MDC IDC SESS DTM: 20170731115633
MDC IDC SET LEADCHNL RV PACING AMPLITUDE: 2.5 V
MDC IDC SET LEADCHNL RV PACING PULSEWIDTH: 0.4 ms

## 2016-02-13 ENCOUNTER — Encounter: Payer: Self-pay | Admitting: Cardiology

## 2016-05-06 ENCOUNTER — Telehealth: Payer: Self-pay | Admitting: Cardiology

## 2016-05-06 ENCOUNTER — Ambulatory Visit (INDEPENDENT_AMBULATORY_CARE_PROVIDER_SITE_OTHER): Payer: Medicare Other | Admitting: *Deleted

## 2016-05-06 DIAGNOSIS — R001 Bradycardia, unspecified: Secondary | ICD-10-CM

## 2016-05-06 NOTE — Telephone Encounter (Signed)
Called pts son and let him know that his mothers transmission did not come through. Informed him that she would need to send in a manuel transmission. Pts son stated that he was not there with her and he did not think that the person who was with her would be able to send in the transmission. Pts son stated that he would go tomorrow to help her send the transmission in. Phone number given to device clinic for him to call.

## 2016-05-06 NOTE — Telephone Encounter (Signed)
Pt son is calling because mom wasn't at home all day to do the remote check wondering if we received accurate readings

## 2016-05-06 NOTE — Telephone Encounter (Signed)
Confirmed remote transmission w/ pt husband.   

## 2016-05-07 NOTE — Progress Notes (Signed)
Remote pacemaker transmission.   

## 2016-05-08 ENCOUNTER — Ambulatory Visit
Admission: RE | Admit: 2016-05-08 | Discharge: 2016-05-08 | Disposition: A | Discharge status: Home / Self Care | Payer: Medicare Other | Source: Ambulatory Visit | Attending: Nurse Practitioner | Admitting: Nurse Practitioner

## 2016-05-08 ENCOUNTER — Other Ambulatory Visit: Payer: Self-pay | Admitting: Nurse Practitioner

## 2016-05-08 DIAGNOSIS — M25561 Pain in right knee: Secondary | ICD-10-CM

## 2016-05-08 DIAGNOSIS — M25552 Pain in left hip: Secondary | ICD-10-CM

## 2016-05-08 DIAGNOSIS — M25562 Pain in left knee: Secondary | ICD-10-CM

## 2016-05-08 DIAGNOSIS — R269 Unspecified abnormalities of gait and mobility: Secondary | ICD-10-CM | POA: Diagnosis not present

## 2016-05-08 DIAGNOSIS — M25551 Pain in right hip: Secondary | ICD-10-CM

## 2016-05-08 DIAGNOSIS — M170 Bilateral primary osteoarthritis of knee: Secondary | ICD-10-CM | POA: Diagnosis not present

## 2016-05-10 ENCOUNTER — Encounter: Payer: Self-pay | Admitting: Cardiology

## 2016-05-13 DIAGNOSIS — Z0000 Encounter for general adult medical examination without abnormal findings: Secondary | ICD-10-CM | POA: Diagnosis not present

## 2016-05-13 DIAGNOSIS — Z23 Encounter for immunization: Secondary | ICD-10-CM | POA: Diagnosis not present

## 2016-05-13 DIAGNOSIS — Z79899 Other long term (current) drug therapy: Secondary | ICD-10-CM | POA: Diagnosis not present

## 2016-05-13 DIAGNOSIS — I509 Heart failure, unspecified: Secondary | ICD-10-CM | POA: Diagnosis not present

## 2016-05-13 DIAGNOSIS — Z1389 Encounter for screening for other disorder: Secondary | ICD-10-CM | POA: Diagnosis not present

## 2016-05-13 DIAGNOSIS — E039 Hypothyroidism, unspecified: Secondary | ICD-10-CM | POA: Diagnosis not present

## 2016-05-13 DIAGNOSIS — G301 Alzheimer's disease with late onset: Secondary | ICD-10-CM | POA: Diagnosis not present

## 2016-05-13 DIAGNOSIS — I480 Paroxysmal atrial fibrillation: Secondary | ICD-10-CM | POA: Diagnosis not present

## 2016-05-13 DIAGNOSIS — M810 Age-related osteoporosis without current pathological fracture: Secondary | ICD-10-CM | POA: Diagnosis not present

## 2016-05-13 DIAGNOSIS — I10 Essential (primary) hypertension: Secondary | ICD-10-CM | POA: Diagnosis not present

## 2016-05-20 DIAGNOSIS — H2512 Age-related nuclear cataract, left eye: Secondary | ICD-10-CM | POA: Diagnosis not present

## 2016-05-20 DIAGNOSIS — Z961 Presence of intraocular lens: Secondary | ICD-10-CM | POA: Diagnosis not present

## 2016-05-21 LAB — CUP PACEART REMOTE DEVICE CHECK
Implantable Lead Implant Date: 20170126
Implantable Pulse Generator Implant Date: 20170126
Lead Channel Impedance Value: 523 Ohm
Lead Channel Pacing Threshold Amplitude: 0.75 V
Lead Channel Pacing Threshold Pulse Width: 0.4 ms
MDC IDC LEAD LOCATION: 753860
MDC IDC MSMT BATTERY IMPEDANCE: 179 Ohm
MDC IDC MSMT BATTERY REMAINING LONGEVITY: 104 mo
MDC IDC MSMT BATTERY VOLTAGE: 2.78 V
MDC IDC MSMT LEADCHNL RA IMPEDANCE VALUE: 0 Ohm
MDC IDC SESS DTM: 20171031090319
MDC IDC SET LEADCHNL RV PACING AMPLITUDE: 2.5 V
MDC IDC SET LEADCHNL RV PACING PULSEWIDTH: 0.4 ms
MDC IDC SET LEADCHNL RV SENSING SENSITIVITY: 2 mV
MDC IDC STAT BRADY RV PERCENT PACED: 94 %

## 2016-05-24 ENCOUNTER — Encounter: Payer: Self-pay | Admitting: Cardiology

## 2016-05-29 ENCOUNTER — Encounter: Payer: Self-pay | Admitting: Cardiology

## 2016-06-10 ENCOUNTER — Encounter: Payer: Medicare Other | Admitting: Cardiology

## 2016-06-21 ENCOUNTER — Encounter: Payer: Self-pay | Admitting: *Deleted

## 2016-07-15 ENCOUNTER — Ambulatory Visit (INDEPENDENT_AMBULATORY_CARE_PROVIDER_SITE_OTHER): Payer: Medicare Other | Admitting: Cardiology

## 2016-07-15 ENCOUNTER — Encounter: Payer: Self-pay | Admitting: Cardiology

## 2016-07-15 VITALS — BP 138/90 | HR 62 | Ht 67.0 in | Wt 159.4 lb

## 2016-07-15 DIAGNOSIS — I441 Atrioventricular block, second degree: Secondary | ICD-10-CM

## 2016-07-15 DIAGNOSIS — I4821 Permanent atrial fibrillation: Secondary | ICD-10-CM

## 2016-07-15 DIAGNOSIS — I482 Chronic atrial fibrillation: Secondary | ICD-10-CM

## 2016-07-15 LAB — CUP PACEART INCLINIC DEVICE CHECK
Date Time Interrogation Session: 20180108154724
Implantable Lead Location: 753860
Implantable Pulse Generator Implant Date: 20170126
Lead Channel Impedance Value: 0 Ohm
Lead Channel Impedance Value: 551 Ohm
Lead Channel Pacing Threshold Pulse Width: 0.4 ms
MDC IDC LEAD IMPLANT DT: 20170126
MDC IDC MSMT BATTERY IMPEDANCE: 203 Ohm
MDC IDC MSMT BATTERY REMAINING LONGEVITY: 103 mo
MDC IDC MSMT BATTERY VOLTAGE: 2.77 V
MDC IDC MSMT LEADCHNL RV PACING THRESHOLD AMPLITUDE: 0.75 V
MDC IDC SET LEADCHNL RV PACING AMPLITUDE: 2.5 V
MDC IDC SET LEADCHNL RV PACING PULSEWIDTH: 0.4 ms
MDC IDC SET LEADCHNL RV SENSING SENSITIVITY: 2 mV
MDC IDC STAT BRADY RV PERCENT PACED: 95 %

## 2016-07-15 NOTE — Progress Notes (Signed)
Electrophysiology Office Note   Date:  07/15/2016   ID:  AMUNIQUE KOKES, DOB [birth year], MRN [id number]  PCP:  Mathews Argyle, MD  Primary Electrophysiologist:  Dalores Weger Meredith Leeds, MD    Chief Complaint  Patient presents with  . Atrial Fibrillation     History of Present Illness: [name] is a 83 y.o. female who presents today for electrophysiology evaluation.   She has a history of atrial fibrillation.  She was found to be bradycardic and unsteady on her feet and was brought to the hospital in January.  She received a dual chamber pacemaker 08/03/15.     Today, she denies symptoms of palpitations, chest pain, shortness of breath, orthopnea, PND, lower extremity edema, claudication, dizziness, presyncope, syncope, bleeding, or neurologic sequela. The patient is tolerating medications without difficulties and is otherwise without complaint today.      Past Medical History:  Diagnosis Date  . 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 Acute congestive heart failure ([place]) 04/19/2014  . Arthritis   . Atrial fibrillation ([place])   . Atrial fibrillation with slow ventricular response ([place])   . Bradycardia 07/29/2015  . Chronic anticoagulation   . Dementia    mild  . Diastolic CHF, chronic ([place])   . Heart murmur   . Hypertension   . Hypertensive heart disease   . Mitral regurgitation   . Nuclear cataract 08/14/2011  . PCO (posterior capsular opacification), right 12/22/2013  . Pseudophakia 12/22/2013  . Second degree AV block    Past Surgical History:  Procedure Laterality Date  . COLONOSCOPY  2002   Remote history of polypectomy  . EP IMPLANTABLE DEVICE N/A 08/03/2015   Procedure: Pacemaker Implant;  Surgeon: Kenzie Flakes Meredith Leeds, MD;  Location: [facility];  Service: Cardiovascular;  Laterality: N/A;  . TOTAL HIP ARTHROPLASTY Bilateral    Left in 2007, right in 2001     Current Outpatient Prescriptions  Medication Sig Dispense Refill  .  acetaminophen (TYLENOL) 500 MG tablet Take 500 mg by mouth 2 (two) times daily as needed for moderate pain.    Marland Kitchen ALPRAZolam (XANAX) 0.25 MG tablet Take 0.125 mg by mouth 2 (two) times daily as needed for anxiety.    Marland Kitchen amLODipine (NORVASC) 2.5 MG tablet Take 5 mg by mouth daily.   12  . Calcium Carbonate-Vitamin D (CALCIUM + D PO) Take 1 tablet by mouth 2 (two) times daily.    . Cholecalciferol (VITAMIN D) 2000 UNITS tablet Take 2,000 Units by mouth daily.    Marland Kitchen ELIQUIS 2.5 MG TABS tablet TAKE 1 TABLET(2.5 MG) BY MOUTH TWICE DAILY 180 tablet 3  . furosemide (LASIX) 40 MG tablet Take 1 tablet (40 mg total) by mouth daily. 60 tablet 12  . lansoprazole (PREVACID) 30 MG capsule Take 30 mg by mouth daily at 12 noon.    Marland Kitchen levothyroxine (SYNTHROID, LEVOTHROID) 75 MCG tablet Take 75 mcg by mouth daily before breakfast.    . losartan (COZAAR) 100 MG tablet Take 100 mg by mouth daily.    . Melatonin 10 MG TABS Take 1 tablet by mouth at bedtime.    . Memantine HCl ER (NAMENDA XR) 28 MG CP24 Take 28 mg by mouth at bedtime. Reported on 08/21/2015    . metoprolol tartrate (LOPRESSOR) 25 MG tablet Take 1 tablet (25 mg total) by mouth 2 (two) times daily. 60 tablet 9  . Multiple Vitamin (MULTIVITAMIN WITH MINERALS) TABS Take 1 tablet by mouth daily.    Marland Kitchen  rivastigmine (EXELON) 9.5 mg/24hr Place 1 patch onto the skin daily.    . traZODone (DESYREL) 50 MG tablet Take 25 mg by mouth at bedtime.     No current facility-administered medications for this visit.     Allergies:   Dilaudid [hydromorphone hcl] and Vicodin [hydrocodone-acetaminophen]   Social History:  The patient  reports that she has never smoked. She has never used smokeless tobacco. She reports that she does not drink alcohol or use drugs.   Family History:  The patient's family history includes Hemophilia in her father, mother, and son; Pneumonia (age of onset: 46) in her mother; Ulcers in her father.    ROS:  Please see the history of present  illness.   Otherwise, review of systems is positive for Leg swelling, anxiety.   All other systems are reviewed and negative.    PHYSICAL EXAM: VS:  BP 138/90   Pulse 62   Ht 5\' 7"  (1.702 m)   Wt 159 lb 6.4 oz (72.3 kg)   BMI 24.97 kg/m  , BMI Body mass index is 24.97 kg/m. GEN: Well nourished, well developed, in no acute distress  HEENT: normal  Neck: no JVD, carotid bruits, or masses Cardiac: RRR; no murmurs, rubs, or gallops,no edema  Respiratory:  clear to auscultation bilaterally, normal work of breathing GI: soft, nontender, nondistended, + BS MS: no deformity or atrophy  Skin: warm and dry,  device pocket is well healed Neuro:  Strength and sensation are intact Psych: euthymic mood, full affect  EKG:  EKG is not ordered today. Personal review of the ekg ordered 11/06/15 today shows atrial fibrillation, V paced   Device interrogation is reviewed today in detail.  See PaceArt for details.   Recent Labs: 07/29/2015: Hemoglobin 14.3; Platelets 211; TSH 2.276 08/03/2015: BUN 12; Creatinine, Ser 1.02; Magnesium 2.1; Potassium 3.4; Sodium 140    Lipid Panel  No results found for: CHOL, TRIG, HDL, CHOLHDL, VLDL, LDLCALC, LDLDIRECT   Wt Readings from Last 3 Encounters:  07/15/16 159 lb 6.4 oz (72.3 kg)  11/06/15 140 lb (63.5 kg)  08/04/15 128 lb 6.4 oz (58.2 kg)      Other studies Reviewed: Additional studies/ records that were reviewed today include: TTE 2015  Review of the above records today demonstrates:  - Left ventricle: The cavity size was normal. Systolic function was mildly reduced. The estimated ejection fraction was in the range of 45% to 50%. Wall motion was normal; there were no regional wall motion abnormalities. - Ventricular septum: Septal motion showed paradox. - Aortic valve: Mild diffuse thickening and calcification, consistent with sclerosis. There was mild regurgitation. - Mitral valve: There was moderate regurgitation. - Left atrium: The  atrium was severely dilated. - Pulmonic valve: There was mild regurgitation. - Pulmonary arteries: PA peak pressure: 35 mm Hg (S). - Pericardium, extracardiac: A small, free-flowing pericardial effusion was identified. The fluid had no internal echoes.There was no evidence of hemodynamic compromise. There was a left pleural effusion.   ASSESSMENT AND PLAN:  1.  Atrial fibrillation: Had pacemaker placed for significant bradycardia and symptoms during atrial fibrillation.  Anticoagulated with Eliquis.   Really not having many symptoms from her atrial fibrillation. We'll continue current management.  This patients CHA2DS2-VASc Score and unadjusted Ischemic Stroke Rate (% per year) is equal to 4.8 % stroke rate/year from a score of 4  Above score calculated as 1 point each if present [CHF, HTN, DM, Vascular=MI/PAD/Aortic Plaque, Age if 65-74, or Female] Above score calculated  as 2 points each if present [Age > 75, or Stroke/TIA/TE]  2.  Advanced second degree AV block:   Single-chamber RV pacemaker placed. Functioning appropriately. No changes necessary today.     Current medicines are reviewed at length with the patient today.   The patient no concerns regarding her medicines.  The following changes were made today:  Add metoprolol 25 mg BID, take Norvasc in the PM  Labs/ tests ordered today include:  No orders of the defined types were placed in this encounter.    Disposition:   FU with Ichelle Harral 9 months  Signed, Joeanthony Seeling Meredith Leeds, MD  07/15/2016 11:56 AM     [facility] [address] [place] [address] [phone] (office) [phone] (fax)

## 2016-07-15 NOTE — Patient Instructions (Signed)
Medication Instructions:    Your physician recommends that you continue on your current medications as directed. Please refer to the Current Medication list given to you today.  --- If you need a refill on your cardiac medications before your next appointment, please call your pharmacy. ---  Labwork:  None ordered  Testing/Procedures:  None ordered  Follow-Up: Remote monitoring is used to monitor your Pacemaker of ICD from home. This monitoring reduces the number of office visits required to check your device to one time per year. It allows [place] to keep an eye on the functioning of your device to ensure it is working properly. You are scheduled for a device check from home on 10/14/2016. You may send your transmission at any time that day. If you have a wireless device, the transmission will be sent automatically. After your physician reviews your transmission, you will receive a postcard with your next transmission date.   Your physician wants you to follow-up in: 1 year with Dr. Curt Bears.  You will receive a reminder letter in the mail two months in advance. If you don't receive a letter, please call our office to schedule the follow-up appointment.  Thank you for choosing [facility]!!   Trinidad Curet, RN [phone]

## 2016-07-25 ENCOUNTER — Other Ambulatory Visit: Payer: Self-pay | Admitting: Cardiology

## 2016-09-16 ENCOUNTER — Other Ambulatory Visit: Payer: Self-pay | Admitting: Cardiology

## 2016-10-14 ENCOUNTER — Ambulatory Visit (INDEPENDENT_AMBULATORY_CARE_PROVIDER_SITE_OTHER): Payer: Medicare Other | Admitting: *Deleted

## 2016-10-14 DIAGNOSIS — I441 Atrioventricular block, second degree: Secondary | ICD-10-CM | POA: Diagnosis not present

## 2016-10-15 NOTE — Progress Notes (Signed)
Remote pacemaker transmission.   

## 2016-10-16 ENCOUNTER — Encounter: Payer: Self-pay | Admitting: Cardiology

## 2016-10-17 LAB — CUP PACEART REMOTE DEVICE CHECK
Battery Impedance: 227 Ohm
Battery Remaining Longevity: 98 mo
Brady Statistic RV Percent Paced: 100 %
Date Time Interrogation Session: 20180409114928
Implantable Lead Implant Date: 20170126
Implantable Lead Model: 5076
Implantable Pulse Generator Implant Date: 20170126
Lead Channel Impedance Value: 501 Ohm
Lead Channel Pacing Threshold Amplitude: 0.75 V
Lead Channel Pacing Threshold Pulse Width: 0.4 ms
MDC IDC LEAD LOCATION: 753860
MDC IDC MSMT BATTERY VOLTAGE: 2.77 V
MDC IDC MSMT LEADCHNL RA IMPEDANCE VALUE: 0 Ohm
MDC IDC SET LEADCHNL RV PACING AMPLITUDE: 2.5 V
MDC IDC SET LEADCHNL RV PACING PULSEWIDTH: 0.4 ms
MDC IDC SET LEADCHNL RV SENSING SENSITIVITY: 2 mV

## 2016-11-12 DIAGNOSIS — H6123 Impacted cerumen, bilateral: Secondary | ICD-10-CM | POA: Diagnosis not present

## 2016-11-12 DIAGNOSIS — K219 Gastro-esophageal reflux disease without esophagitis: Secondary | ICD-10-CM | POA: Diagnosis not present

## 2016-11-12 DIAGNOSIS — Z79899 Other long term (current) drug therapy: Secondary | ICD-10-CM | POA: Diagnosis not present

## 2016-11-12 DIAGNOSIS — I480 Paroxysmal atrial fibrillation: Secondary | ICD-10-CM | POA: Diagnosis not present

## 2016-11-12 DIAGNOSIS — G301 Alzheimer's disease with late onset: Secondary | ICD-10-CM | POA: Diagnosis not present

## 2016-11-12 DIAGNOSIS — I509 Heart failure, unspecified: Secondary | ICD-10-CM | POA: Diagnosis not present

## 2016-11-12 DIAGNOSIS — I10 Essential (primary) hypertension: Secondary | ICD-10-CM | POA: Diagnosis not present

## 2016-11-13 ENCOUNTER — Other Ambulatory Visit: Payer: Self-pay | Admitting: Nurse Practitioner

## 2016-11-20 ENCOUNTER — Other Ambulatory Visit: Payer: Self-pay | Admitting: *Deleted

## 2016-11-20 NOTE — Telephone Encounter (Signed)
Patients son, Jenny Reichmann called and stated that the patient is almost out of eliquis. He can be reached at [phone]. Thanks, MI

## 2016-11-22 MED ORDER — APIXABAN 5 MG PO TABS: 5.0000 mg | ORAL_TABLET | Freq: Two times a day (BID) | ORAL | 3 refills | Status: AC

## 2016-11-22 NOTE — Telephone Encounter (Signed)
11/12/16 PCP visit at [facility] - SCr 0.83, 158 lbs. Pt is on the incorrect dose of Eliquis, should be taking 5mg  BID. Called pt to let her know and sent in correct dosing.

## 2016-12-06 DIAGNOSIS — H9312 Tinnitus, left ear: Secondary | ICD-10-CM | POA: Diagnosis not present

## 2016-12-06 DIAGNOSIS — H6123 Impacted cerumen, bilateral: Secondary | ICD-10-CM | POA: Diagnosis not present

## 2016-12-06 DIAGNOSIS — J343 Hypertrophy of nasal turbinates: Secondary | ICD-10-CM | POA: Diagnosis not present

## 2016-12-06 DIAGNOSIS — Z87891 Personal history of nicotine dependence: Secondary | ICD-10-CM | POA: Diagnosis not present

## 2017-01-09 DIAGNOSIS — D1801 Hemangioma of skin and subcutaneous tissue: Secondary | ICD-10-CM | POA: Diagnosis not present

## 2017-01-09 DIAGNOSIS — L298 Other pruritus: Secondary | ICD-10-CM | POA: Diagnosis not present

## 2017-01-09 DIAGNOSIS — L820 Inflamed seborrheic keratosis: Secondary | ICD-10-CM | POA: Diagnosis not present

## 2017-01-09 DIAGNOSIS — L821 Other seborrheic keratosis: Secondary | ICD-10-CM | POA: Diagnosis not present

## 2017-01-09 DIAGNOSIS — L814 Other melanin hyperpigmentation: Secondary | ICD-10-CM | POA: Diagnosis not present

## 2017-01-09 DIAGNOSIS — D2262 Melanocytic nevi of left upper limb, including shoulder: Secondary | ICD-10-CM | POA: Diagnosis not present

## 2017-01-13 ENCOUNTER — Ambulatory Visit (INDEPENDENT_AMBULATORY_CARE_PROVIDER_SITE_OTHER): Payer: Medicare Other | Admitting: *Deleted

## 2017-01-13 DIAGNOSIS — R001 Bradycardia, unspecified: Secondary | ICD-10-CM | POA: Diagnosis not present

## 2017-01-14 LAB — CUP PACEART REMOTE DEVICE CHECK
Battery Remaining Longevity: 91 mo
Battery Voltage: 2.77 V
Brady Statistic RV Percent Paced: 99 %
Implantable Lead Model: 5076
Lead Channel Impedance Value: 0 Ohm
Lead Channel Pacing Threshold Amplitude: 0.75 V
Lead Channel Setting Pacing Amplitude: 2.5 V
Lead Channel Setting Pacing Pulse Width: 0.4 ms
MDC IDC LEAD IMPLANT DT: 20170126
MDC IDC LEAD LOCATION: 753860
MDC IDC MSMT BATTERY IMPEDANCE: 299 Ohm
MDC IDC MSMT LEADCHNL RV IMPEDANCE VALUE: 482 Ohm
MDC IDC MSMT LEADCHNL RV PACING THRESHOLD PULSEWIDTH: 0.4 ms
MDC IDC PG IMPLANT DT: 20170126
MDC IDC SESS DTM: 20180709092105
MDC IDC SET LEADCHNL RV SENSING SENSITIVITY: 2.8 mV

## 2017-01-14 NOTE — Progress Notes (Signed)
Remote pacemaker transmission.   

## 2017-01-15 ENCOUNTER — Encounter: Payer: Self-pay | Admitting: Cardiology

## 2017-01-20 ENCOUNTER — Encounter: Payer: Self-pay | Admitting: Cardiology

## 2017-03-05 ENCOUNTER — Encounter: Payer: Self-pay | Admitting: Neurology

## 2017-03-20 ENCOUNTER — Ambulatory Visit (INDEPENDENT_AMBULATORY_CARE_PROVIDER_SITE_OTHER): Payer: Medicare Other | Admitting: Podiatry

## 2017-03-20 ENCOUNTER — Other Ambulatory Visit: Payer: Self-pay | Admitting: Podiatry

## 2017-03-20 ENCOUNTER — Encounter: Payer: Self-pay | Admitting: Podiatry

## 2017-03-20 ENCOUNTER — Ambulatory Visit (INDEPENDENT_AMBULATORY_CARE_PROVIDER_SITE_OTHER): Payer: Medicare Other

## 2017-03-20 VITALS — BP 142/78 | HR 66

## 2017-03-20 DIAGNOSIS — M779 Enthesopathy, unspecified: Secondary | ICD-10-CM

## 2017-03-20 DIAGNOSIS — M79674 Pain in right toe(s): Secondary | ICD-10-CM | POA: Diagnosis not present

## 2017-03-20 DIAGNOSIS — L600 Ingrowing nail: Secondary | ICD-10-CM | POA: Diagnosis not present

## 2017-03-20 NOTE — Progress Notes (Signed)
Subjective:    Patient ID: [name], female   DOB: 83 y.o.   MRN: [id number]   HPI patient presents with caregiver with painful ingrown toenail and trauma to the right hallux with difficulty discussing with patient due to dementia    Review of Systems  All other systems reviewed and are negative.       Objective:  Physical Exam  Constitutional: She appears well-developed and well-nourished.  Cardiovascular: Intact distal pulses.   Musculoskeletal: Normal range of motion.  Neurological: She is alert.  Skin: Skin is warm.  Nursing note and vitals reviewed.  neurovascular status found to be intact muscle strength was adequate with incurvated right hallux medial border that's painful when pressed and also noted to have discomfort of the toe itself with redness of the toe with possibility of trauma that she's unable to articulate. Found have good digital perfusion well oriented 3     Assessment:   Ingrown toenail deformity with possibility for acute inflammation     Plan:    H&P x-ray reviewed and today I recommended correction of the ingrown and went ahead and infiltrated the hallux 60 Milligan's like Marcaine mixture remove the border exposed matrix and applied phenol 3 applications 30 seconds followed by alcohol lavaged sterile dressing. I then went ahead and applied padding to the toe instructed on soaks and reappoint if symptoms persist  X-rays indicate no indications of bone trauma to the digit

## 2017-03-20 NOTE — Patient Instructions (Addendum)

## 2017-03-20 NOTE — Progress Notes (Signed)
   Subjective:    Patient ID: [name], female    DOB: Sep 10, 1932, 83 y.o.   MRN: [id number]  HPI No chief complaint on file.      Review of Systems  HENT: Positive for tinnitus.   Musculoskeletal: Positive for gait problem.  All other systems reviewed and are negative.      Objective:   Physical Exam        Assessment & Plan:

## 2017-04-02 DIAGNOSIS — I509 Heart failure, unspecified: Secondary | ICD-10-CM | POA: Diagnosis not present

## 2017-04-02 DIAGNOSIS — M810 Age-related osteoporosis without current pathological fracture: Secondary | ICD-10-CM | POA: Diagnosis not present

## 2017-04-02 DIAGNOSIS — G301 Alzheimer's disease with late onset: Secondary | ICD-10-CM | POA: Diagnosis not present

## 2017-04-02 DIAGNOSIS — I10 Essential (primary) hypertension: Secondary | ICD-10-CM | POA: Diagnosis not present

## 2017-04-02 DIAGNOSIS — I480 Paroxysmal atrial fibrillation: Secondary | ICD-10-CM | POA: Diagnosis not present

## 2017-04-02 DIAGNOSIS — E039 Hypothyroidism, unspecified: Secondary | ICD-10-CM | POA: Diagnosis not present

## 2017-04-05 DIAGNOSIS — Z23 Encounter for immunization: Secondary | ICD-10-CM | POA: Diagnosis not present

## 2017-04-14 ENCOUNTER — Ambulatory Visit (INDEPENDENT_AMBULATORY_CARE_PROVIDER_SITE_OTHER): Payer: Medicare Other | Admitting: *Deleted

## 2017-04-14 DIAGNOSIS — I441 Atrioventricular block, second degree: Secondary | ICD-10-CM

## 2017-04-15 NOTE — Progress Notes (Signed)
Remote pacemaker transmission.   

## 2017-04-16 LAB — CUP PACEART REMOTE DEVICE CHECK
Battery Impedance: 300 Ohm
Battery Remaining Longevity: 91 mo
Battery Voltage: 2.77 V
Brady Statistic RV Percent Paced: 99 %
Implantable Lead Location: 753860
Implantable Lead Model: 5076
Lead Channel Setting Pacing Amplitude: 2.5 V
Lead Channel Setting Pacing Pulse Width: 0.4 ms
Lead Channel Setting Sensing Sensitivity: 2.8 mV
MDC IDC LEAD IMPLANT DT: 20170126
MDC IDC MSMT LEADCHNL RA IMPEDANCE VALUE: 0 Ohm
MDC IDC MSMT LEADCHNL RV IMPEDANCE VALUE: 465 Ohm
MDC IDC MSMT LEADCHNL RV PACING THRESHOLD AMPLITUDE: 0.75 V
MDC IDC MSMT LEADCHNL RV PACING THRESHOLD PULSEWIDTH: 0.4 ms
MDC IDC PG IMPLANT DT: 20170126
MDC IDC SESS DTM: 20181008120455

## 2017-04-18 ENCOUNTER — Encounter: Payer: Self-pay | Admitting: Cardiology

## 2017-05-02 ENCOUNTER — Encounter: Payer: Self-pay | Admitting: Cardiology

## 2017-05-05 DIAGNOSIS — M810 Age-related osteoporosis without current pathological fracture: Secondary | ICD-10-CM | POA: Diagnosis not present

## 2017-05-05 DIAGNOSIS — G301 Alzheimer's disease with late onset: Secondary | ICD-10-CM | POA: Diagnosis not present

## 2017-05-05 DIAGNOSIS — E039 Hypothyroidism, unspecified: Secondary | ICD-10-CM | POA: Diagnosis not present

## 2017-05-05 DIAGNOSIS — I480 Paroxysmal atrial fibrillation: Secondary | ICD-10-CM | POA: Diagnosis not present

## 2017-05-05 DIAGNOSIS — I10 Essential (primary) hypertension: Secondary | ICD-10-CM | POA: Diagnosis not present

## 2017-05-05 DIAGNOSIS — I509 Heart failure, unspecified: Secondary | ICD-10-CM | POA: Diagnosis not present

## 2017-05-12 ENCOUNTER — Ambulatory Visit (INDEPENDENT_AMBULATORY_CARE_PROVIDER_SITE_OTHER): Payer: Medicare Other | Admitting: Neurology

## 2017-05-12 ENCOUNTER — Encounter: Payer: Self-pay | Admitting: Neurology

## 2017-05-12 VITALS — BP 116/64 | HR 76 | Resp 20 | Ht 67.0 in | Wt 150.0 lb

## 2017-05-12 DIAGNOSIS — F03B18 Unspecified dementia, moderate, with other behavioral disturbance: Secondary | ICD-10-CM

## 2017-05-12 DIAGNOSIS — F0391 Unspecified dementia with behavioral disturbance: Secondary | ICD-10-CM | POA: Diagnosis not present

## 2017-05-12 DIAGNOSIS — F419 Anxiety disorder, unspecified: Secondary | ICD-10-CM | POA: Diagnosis not present

## 2017-05-12 MED ORDER — BUPROPION HCL 75 MG PO TABS: ORAL_TABLET | ORAL | 6 refills | Status: AC

## 2017-05-12 NOTE — Patient Instructions (Signed)
1. Start Wellbutrin 75mg  daily 2. Try to start doing Xanax on an as needed basis 3. Continue all other medications 4. Connect with [facility] to help with navigating looking into Memory Care 5. Follow-up in 6 months, call for any changes

## 2017-05-12 NOTE — Progress Notes (Signed)
NEUROLOGY CONSULTATION NOTE  [name] MRN: [id number] DOB: 01-07-1933  Referring provider: Dr. Lajean Manes Primary care provider: Dr. Lajean Manes  Reason for consult:  Dementia/anxiety  Dear Dr Felipa Eth:  Thank you for your kind referral of [name] for consultation of the above symptoms. Although her history is well known to you, please allow me to reiterate it for the purpose of our medical record. The patient was accompanied to the clinic by her son who also provides collateral information. Records and images were personally reviewed where available.  HISTORY OF PRESENT ILLNESS: This is an 83 year old right-handed woman with a history of hypertension, atrial fibrillation on Eliquis, dementia, presenting to establish care. His son had several questions regarding future planning. She answers "I don't think so" to a lot of ROS questions. She reports her memory "depends on when I'm wanting to know." She has 24/7 care at home. When asked about bills payments, she answers "I do have bills." When asked about medications, "I think I remember to take them." She does not drive. Her son has been in charge of finances for longer than 4 years, he got a call from the bank concerned after a few times she needed help doing her banking/balancing her checkbook. A home aid administers medications. Her cardiologist had noted she was taking her medication the wrong times or missed doses. She started having memory issues probably 5 years ago, and was diagnosed with dementia 3-3-1/2 years ago. His son reports a lengthy transformation over several months, but more recently she does not recognize where they are when pulling into her driveway. She has good and bad days. She continues to do puzzles but has slowed down. She goes to an adult center 4 days a week. Her son reports she is more repetitive. She is on the Exelon patch and Namenda XR without side effects. Her anxiety has worsened. In  2015, they went on a trip and she had a panic attack getting on to the plane. She takes Xanax (taking 2 0.5 tabs in daytime, and 1 tab at night). No hallucinations or paranoia. Sleep is good. Her older sister and paternal cousin had Alzheimer's. Her maternal grandmother had memory issues after a car accident. She denies any history of concussions, no alcohol use.   She denies any headaches, dizziness, diplopia, dysarthria, dysphagia, neck/back pain, focal numbness/tingling/weakness, anosmia, tremors, no falls. She often has issues with diarrhea and some incontinence.    PAST MEDICAL HISTORY: Past Medical History:  Diagnosis Date  . 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 Acute congestive heart failure ([place]) 04/19/2014  . Arthritis   . Atrial fibrillation ([place])   . Atrial fibrillation with slow ventricular response ([place])   . Bradycardia 07/29/2015  . Chronic anticoagulation   . Dementia    mild  . Diastolic CHF, chronic ([place])   . Heart murmur   . Hypertension   . Hypertensive heart disease   . Mitral regurgitation   . Nuclear cataract 08/14/2011  . PCO (posterior capsular opacification), right 12/22/2013  . Pseudophakia 12/22/2013  . Second degree AV block     PAST SURGICAL HISTORY: Past Surgical History:  Procedure Laterality Date  . COLONOSCOPY  2002   Remote history of polypectomy  . TOTAL HIP ARTHROPLASTY Bilateral    Left in 2007, right in 2001    MEDICATIONS: Current Outpatient Medications on File Prior to Visit  Medication Sig Dispense Refill  . acetaminophen (TYLENOL) 500 MG tablet Take 500  mg by mouth 2 (two) times daily as needed for moderate pain.    Marland Kitchen ALPRAZolam (XANAX) 0.25 MG tablet Take 0.125 mg by mouth 2 (two) times daily as needed for anxiety.    Marland Kitchen amLODipine (NORVASC) 5 MG tablet   10  . apixaban (ELIQUIS) 5 MG TABS tablet Take 1 tablet (5 mg total) by mouth 2 (two) times daily. (Patient taking differently: Take 2.5 mg by mouth 2 (two) times daily. ) 180  tablet 3  . Calcium Carbonate-Vitamin D (CALCIUM + D PO) Take 1 tablet by mouth 2 (two) times daily.    . Cholecalciferol (VITAMIN D) 2000 UNITS tablet Take 2,000 Units by mouth daily.    . furosemide (LASIX) 40 MG tablet Take 1 tablet (40 mg total) by mouth daily. 60 tablet 12  . lansoprazole (PREVACID) 30 MG capsule Take 30 mg by mouth daily at 12 noon.    Marland Kitchen levothyroxine (SYNTHROID, LEVOTHROID) 75 MCG tablet Take 75 mcg by mouth daily before breakfast.    . losartan (COZAAR) 100 MG tablet TAKE 1 TABLET BY MOUTH DAILY 30 tablet 10  . Melatonin 10 MG TABS Take 1 tablet by mouth at bedtime.    . Memantine HCl ER (NAMENDA XR) 28 MG CP24 Take 28 mg by mouth at bedtime. Reported on 08/21/2015    . metoprolol tartrate (LOPRESSOR) 25 MG tablet TAKE 1 TABLET(25 MG) BY MOUTH TWICE DAILY 180 tablet 9  . Multiple Vitamin (MULTIVITAMIN WITH MINERALS) TABS Take 1 tablet by mouth daily.    . rivastigmine (EXELON) 9.5 mg/24hr Place 1 patch onto the skin daily.    . traZODone (DESYREL) 50 MG tablet Take 25 mg by mouth at bedtime.     No current facility-administered medications on file prior to visit.     ALLERGIES: Allergies  Allergen Reactions  . Dilaudid [Hydromorphone Hcl] Other (See Comments)    hallucinations  . Vicodin [Hydrocodone-Acetaminophen] Other (See Comments)    hallucinations    FAMILY HISTORY: Family History  Problem Relation Age of Onset  . Hemophilia Father   . Ulcers Father        bleeding ulcers  . Pneumonia Mother 12  . Hemophilia Mother   . Hemophilia Son   . Stroke Son   . Alzheimer's disease Sister     SOCIAL HISTORY: Social History   Socioeconomic History  . Marital status: Divorced    Spouse name: Not on file  . Number of children: Not on file  . Years of education: Not on file  . Highest education level: Not on file  Social Needs  . Financial resource strain: Not on file  . Food insecurity - worry: Not on file  . Food insecurity - inability: Not on file   . Transportation needs - medical: Not on file  . Transportation needs - non-medical: Not on file  Occupational History  . Occupation: Retired  Tobacco Use  . Smoking status: Never Smoker  . Smokeless tobacco: Never Used  Substance and Sexual Activity  . Alcohol use: No  . Drug use: No  . Sexual activity: Not on file  Other Topics Concern  . Not on file  Social History Narrative   She carries the hemophilia gene, her mother and her son both have disease.    REVIEW OF SYSTEMS: Constitutional: No fevers, chills, or sweats, no generalized fatigue, change in appetite Eyes: No visual changes, double vision, eye pain Ear, nose and throat: No hearing loss, ear pain, nasal congestion, sore throat  Cardiovascular: No chest pain, palpitations Respiratory:  No shortness of breath at rest or with exertion, wheezes GastrointestinaI: No nausea, vomiting, diarrhea, abdominal pain, fecal incontinence Genitourinary:  No dysuria, urinary retention or frequency Musculoskeletal:  No neck pain, back pain Integumentary: No rash, pruritus, skin lesions Neurological: as above Psychiatric: No depression, insomnia, anxiety Endocrine: No palpitations, fatigue, diaphoresis, mood swings, change in appetite, change in weight, increased thirst Hematologic/Lymphatic:  No anemia, purpura, petechiae. Allergic/Immunologic: no itchy/runny eyes, nasal congestion, recent allergic reactions, rashes  PHYSICAL EXAM: Vitals:   05/12/17 1340  BP: 116/64  Pulse: 76  Resp: 20   General: No acute distress, flat affect. She keeps her eyes closed during the times I speak to her son. Head:  Normocephalic/atraumatic Eyes: Fundoscopic exam shows bilateral sharp discs, no vessel changes, exudates, or hemorrhages Neck: supple, no paraspinal tenderness, full range of motion Back: No paraspinal tenderness Heart: regular rate and rhythm Lungs: Clear to auscultation bilaterally. Vascular: No carotid bruits. Skin/Extremities:  No rash, no edema Neurological Exam: Mental status: alert and oriented to person, says we are in a hospital, and knows season. She keeps answering "I don't know that" most of the time. No dysarthria or aphasia, Fund of knowledge is reduced.  Recent and remote memory are impaired.  Attention and concentration are reduced. Midway through clock-drawing, she states "what am I doing, do you have all these numbers on a clock."  Able to name objects and repeat phrases. CDT 4/5 MMSE - Mini Mental State Exam 05/12/2017  Orientation to time 1  Orientation to Place 1  Registration 3  Attention/ Calculation 2  Recall 0  Language- name 2 objects 1  Language- repeat 1  Language- follow 3 step command 3  Language- read & follow direction 1  Write a sentence 1  Copy design 0  Total score 14   Cranial nerves: CN I: not tested CN II: pupils equal, round and reactive to light, visual fields intact, fundi unremarkable. CN III, IV, VI:  full range of motion, no nystagmus, no ptosis CN V: facial sensation intact CN VII: upper and lower face symmetric CN VIII: hearing intact to finger rub CN IX, X: gag intact, uvula midline CN XI: sternocleidomastoid and trapezius muscles intact CN XII: tongue midline Bulk & Tone: normal, no fasciculations. Motor: 5/5 throughout with no pronator drift. Sensation: intact to light touch, cold, pin, vibration and joint position sense.  No extinction to double simultaneous stimulation.  Romberg test negative Deep Tendon Reflexes: +2 throughout, no ankle clonus Plantar responses: downgoing bilaterally Cerebellar: no incoordination on finger to nose, heel to shin. No dysdiadochokinesia Gait: slow and cautious, no ataxia, unable to tandem walk Tremor: none  IMPRESSION: This is an 83 year old right-handed woman with a history of hypertension, atrial fibrillation on Eliquis, presenting to establish care for dementia. MMSE today 14/30, indicating moderate dementia with behavioral  disturbance (anxiety). She is on the Exelon patch and Namenda XR, continue current medications. We discussed behavioral changes that occur as dementia progresses, her son is agreeable to start low dose Wellbutrin 75mg  daily. Side effects were discussed, we may uptitrate as tolerated. Her son was advised to start minimizing Xanax use as much as possible. We discussed need for 24/7 care and transitioning to a Memory Care facility. Her son's concerns were addressed, he will be connected with [facility] through the [employer] to help with navigating looking into Memory Care. She will follow-up in 6 months and knows to call for any changes.  Thank you for allowing me to participate in the care of this patient. Please do not hesitate to call for any questions or concerns.   Ellouise Newer, M.D.  CC: Dr. Felipa Eth

## 2017-05-14 ENCOUNTER — Encounter: Payer: Self-pay | Admitting: Neurology

## 2017-05-14 DIAGNOSIS — Z0000 Encounter for general adult medical examination without abnormal findings: Secondary | ICD-10-CM | POA: Diagnosis not present

## 2017-05-14 DIAGNOSIS — K644 Residual hemorrhoidal skin tags: Secondary | ICD-10-CM | POA: Diagnosis not present

## 2017-05-14 DIAGNOSIS — I509 Heart failure, unspecified: Secondary | ICD-10-CM | POA: Diagnosis not present

## 2017-05-14 DIAGNOSIS — K921 Melena: Secondary | ICD-10-CM | POA: Diagnosis not present

## 2017-05-14 DIAGNOSIS — G301 Alzheimer's disease with late onset: Secondary | ICD-10-CM | POA: Diagnosis not present

## 2017-05-14 DIAGNOSIS — Z1389 Encounter for screening for other disorder: Secondary | ICD-10-CM | POA: Diagnosis not present

## 2017-05-14 DIAGNOSIS — F419 Anxiety disorder, unspecified: Secondary | ICD-10-CM | POA: Diagnosis not present

## 2017-05-14 DIAGNOSIS — Z79899 Other long term (current) drug therapy: Secondary | ICD-10-CM | POA: Diagnosis not present

## 2017-05-14 DIAGNOSIS — K219 Gastro-esophageal reflux disease without esophagitis: Secondary | ICD-10-CM | POA: Diagnosis not present

## 2017-05-14 DIAGNOSIS — Z111 Encounter for screening for respiratory tuberculosis: Secondary | ICD-10-CM | POA: Diagnosis not present

## 2017-05-14 DIAGNOSIS — I480 Paroxysmal atrial fibrillation: Secondary | ICD-10-CM | POA: Diagnosis not present

## 2017-05-14 DIAGNOSIS — I10 Essential (primary) hypertension: Secondary | ICD-10-CM | POA: Diagnosis not present

## 2017-06-01 DIAGNOSIS — S51801A Unspecified open wound of right forearm, initial encounter: Secondary | ICD-10-CM | POA: Diagnosis not present

## 2017-06-11 ENCOUNTER — Inpatient Hospital Stay (HOSPITAL_COMMUNITY)
Admission: EM | Admit: 2017-06-11 | Discharge: 2017-06-17 | DRG: 177 | Disposition: A | Discharge status: Home / Self Care | Payer: Medicare Other | Attending: Internal Medicine | Admitting: Internal Medicine

## 2017-06-11 ENCOUNTER — Other Ambulatory Visit: Payer: Self-pay

## 2017-06-11 ENCOUNTER — Emergency Department (HOSPITAL_COMMUNITY): Payer: Medicare Other

## 2017-06-11 ENCOUNTER — Encounter (HOSPITAL_COMMUNITY): Payer: Self-pay | Admitting: Emergency Medicine

## 2017-06-11 DIAGNOSIS — W1830XA Fall on same level, unspecified, initial encounter: Secondary | ICD-10-CM | POA: Diagnosis present

## 2017-06-11 DIAGNOSIS — J690 Pneumonitis due to inhalation of food and vomit: Principal | ICD-10-CM | POA: Diagnosis present

## 2017-06-11 DIAGNOSIS — K222 Esophageal obstruction: Secondary | ICD-10-CM | POA: Diagnosis not present

## 2017-06-11 DIAGNOSIS — D649 Anemia, unspecified: Secondary | ICD-10-CM

## 2017-06-11 DIAGNOSIS — J9601 Acute respiratory failure with hypoxia: Secondary | ICD-10-CM

## 2017-06-11 DIAGNOSIS — Z885 Allergy status to narcotic agent status: Secondary | ICD-10-CM

## 2017-06-11 DIAGNOSIS — E039 Hypothyroidism, unspecified: Secondary | ICD-10-CM | POA: Diagnosis present

## 2017-06-11 DIAGNOSIS — I110 Hypertensive heart disease with heart failure: Secondary | ICD-10-CM | POA: Diagnosis present

## 2017-06-11 DIAGNOSIS — J189 Pneumonia, unspecified organism: Secondary | ICD-10-CM | POA: Diagnosis not present

## 2017-06-11 DIAGNOSIS — R0989 Other specified symptoms and signs involving the circulatory and respiratory systems: Secondary | ICD-10-CM

## 2017-06-11 DIAGNOSIS — I119 Hypertensive heart disease without heart failure: Secondary | ICD-10-CM | POA: Diagnosis present

## 2017-06-11 DIAGNOSIS — S0990XA Unspecified injury of head, initial encounter: Secondary | ICD-10-CM | POA: Diagnosis not present

## 2017-06-11 DIAGNOSIS — I5032 Chronic diastolic (congestive) heart failure: Secondary | ICD-10-CM | POA: Diagnosis present

## 2017-06-11 DIAGNOSIS — I340 Nonrheumatic mitral (valve) insufficiency: Secondary | ICD-10-CM | POA: Diagnosis present

## 2017-06-11 DIAGNOSIS — S79912A Unspecified injury of left hip, initial encounter: Secondary | ICD-10-CM | POA: Diagnosis not present

## 2017-06-11 DIAGNOSIS — Y92009 Unspecified place in unspecified non-institutional (private) residence as the place of occurrence of the external cause: Secondary | ICD-10-CM

## 2017-06-11 DIAGNOSIS — F458 Other somatoform disorders: Secondary | ICD-10-CM | POA: Diagnosis not present

## 2017-06-11 DIAGNOSIS — I441 Atrioventricular block, second degree: Secondary | ICD-10-CM | POA: Diagnosis present

## 2017-06-11 DIAGNOSIS — Z66 Do not resuscitate: Secondary | ICD-10-CM | POA: Diagnosis present

## 2017-06-11 DIAGNOSIS — Z96643 Presence of artificial hip joint, bilateral: Secondary | ICD-10-CM | POA: Diagnosis present

## 2017-06-11 DIAGNOSIS — F0390 Unspecified dementia without behavioral disturbance: Secondary | ICD-10-CM | POA: Diagnosis present

## 2017-06-11 DIAGNOSIS — R0902 Hypoxemia: Secondary | ICD-10-CM

## 2017-06-11 DIAGNOSIS — I4891 Unspecified atrial fibrillation: Secondary | ICD-10-CM | POA: Diagnosis present

## 2017-06-11 DIAGNOSIS — E871 Hypo-osmolality and hyponatremia: Secondary | ICD-10-CM | POA: Diagnosis present

## 2017-06-11 DIAGNOSIS — M25552 Pain in left hip: Secondary | ICD-10-CM | POA: Diagnosis not present

## 2017-06-11 DIAGNOSIS — R0602 Shortness of breath: Secondary | ICD-10-CM | POA: Diagnosis not present

## 2017-06-11 DIAGNOSIS — Z79890 Hormone replacement therapy (postmenopausal): Secondary | ICD-10-CM | POA: Diagnosis not present

## 2017-06-11 DIAGNOSIS — Z7901 Long term (current) use of anticoagulants: Secondary | ICD-10-CM | POA: Diagnosis not present

## 2017-06-11 HISTORY — DX: Anemia, unspecified: D64.9

## 2017-06-11 HISTORY — DX: Presence of cardiac pacemaker: Z95.0

## 2017-06-11 HISTORY — DX: Pneumonia, unspecified organism: J18.9

## 2017-06-11 LAB — VITAMIN B12: Vitamin B-12: 589 pg/mL (ref 180–914)

## 2017-06-11 LAB — URINALYSIS, ROUTINE W REFLEX MICROSCOPIC
Bilirubin Urine: NEGATIVE
GLUCOSE, UA: NEGATIVE mg/dL
HGB URINE DIPSTICK: NEGATIVE
KETONES UR: NEGATIVE mg/dL
LEUKOCYTES UA: NEGATIVE
Nitrite: NEGATIVE
PROTEIN: NEGATIVE mg/dL
Specific Gravity, Urine: 1.013 (ref 1.005–1.030)
pH: 6 (ref 5.0–8.0)

## 2017-06-11 LAB — CBC
HEMATOCRIT: 33.4 % — AB (ref 36.0–46.0)
Hemoglobin: 10.9 g/dL — ABNORMAL LOW (ref 12.0–15.0)
MCH: 29.8 pg (ref 26.0–34.0)
MCHC: 32.6 g/dL (ref 30.0–36.0)
MCV: 91.3 fL (ref 78.0–100.0)
PLATELETS: 261 10*3/uL (ref 150–400)
RBC: 3.66 MIL/uL — AB (ref 3.87–5.11)
RDW: 14.6 % (ref 11.5–15.5)
WBC: 7.4 10*3/uL (ref 4.0–10.5)

## 2017-06-11 LAB — COMPREHENSIVE METABOLIC PANEL
ALT: 15 U/L (ref 14–54)
AST: 26 U/L (ref 15–41)
Albumin: 3.4 g/dL — ABNORMAL LOW (ref 3.5–5.0)
Alkaline Phosphatase: 59 U/L (ref 38–126)
Anion gap: 12 (ref 5–15)
BILIRUBIN TOTAL: 1.1 mg/dL (ref 0.3–1.2)
BUN: 14 mg/dL (ref 6–20)
CO2: 24 mmol/L (ref 22–32)
CREATININE: 0.89 mg/dL (ref 0.44–1.00)
Calcium: 9 mg/dL (ref 8.9–10.3)
Chloride: 94 mmol/L — ABNORMAL LOW (ref 101–111)
GFR, EST NON AFRICAN AMERICAN: 59 mL/min — AB (ref >60)
Glucose, Bld: 103 mg/dL — ABNORMAL HIGH (ref 65–99)
POTASSIUM: 3.6 mmol/L (ref 3.5–5.1)
Sodium: 130 mmol/L — ABNORMAL LOW (ref 135–145)
TOTAL PROTEIN: 6.7 g/dL (ref 6.5–8.1)

## 2017-06-11 LAB — SODIUM, URINE, RANDOM: Sodium, Ur: 18 mmol/L

## 2017-06-11 LAB — IRON AND TIBC
Iron: 67 ug/dL (ref 28–170)
Saturation Ratios: 17 % (ref 10.4–31.8)
TIBC: 398 ug/dL (ref 250–450)
UIBC: 331 ug/dL

## 2017-06-11 LAB — BASIC METABOLIC PANEL
Anion gap: 10 (ref 5–15)
BUN: 11 mg/dL (ref 6–20)
CALCIUM: 8.6 mg/dL — AB (ref 8.9–10.3)
CO2: 22 mmol/L (ref 22–32)
CREATININE: 0.73 mg/dL (ref 0.44–1.00)
Chloride: 98 mmol/L — ABNORMAL LOW (ref 101–111)
GFR calc non Af Amer: >60 mL/min (ref >60)
Glucose, Bld: 116 mg/dL — ABNORMAL HIGH (ref 65–99)
Potassium: 3.7 mmol/L (ref 3.5–5.1)
SODIUM: 130 mmol/L — AB (ref 135–145)

## 2017-06-11 LAB — INFLUENZA PANEL BY PCR (TYPE A & B)
Influenza A By PCR: NEGATIVE
Influenza B By PCR: NEGATIVE

## 2017-06-11 LAB — TSH: TSH: 6.83 u[IU]/mL — ABNORMAL HIGH (ref 0.350–4.500)

## 2017-06-11 LAB — FOLATE: FOLATE: 42 ng/mL (ref >5.9)

## 2017-06-11 LAB — FERRITIN: Ferritin: 78 ng/mL (ref 11–307)

## 2017-06-11 LAB — RETICULOCYTES
RBC.: 3.75 MIL/uL — ABNORMAL LOW (ref 3.87–5.11)
Retic Count, Absolute: 101.3 10*3/uL (ref 19.0–186.0)
Retic Ct Pct: 2.7 % (ref 0.4–3.1)

## 2017-06-11 MED ORDER — METOPROLOL TARTRATE 25 MG PO TABS: 25.0000 mg | ORAL_TABLET | Freq: Two times a day (BID) | ORAL | Status: DC

## 2017-06-11 MED ORDER — ALPRAZOLAM 0.25 MG PO TABS
0.1250 mg | ORAL_TABLET | Freq: Two times a day (BID) | ORAL | Status: DC | PRN
  Administered 2017-06-11 (×2): 0.125 mg via ORAL
  Filled 2017-06-11 (×2): qty 1

## 2017-06-11 MED ORDER — MELATONIN 3 MG PO TABS
3.0000 mg | ORAL_TABLET | Freq: Every day | ORAL | Status: DC
  Filled 2017-06-11: qty 1

## 2017-06-11 MED ORDER — DEXTROSE 5 % IV SOLN: 500.0000 mg | INTRAVENOUS | Status: DC

## 2017-06-11 MED ORDER — CALCIUM CARBONATE-VITAMIN D 500-200 MG-UNIT PO TABS
1.0000 | ORAL_TABLET | Freq: Two times a day (BID) | ORAL | Status: DC
  Administered 2017-06-12 – 2017-06-17 (×11): 1 via ORAL
  Filled 2017-06-11 (×11): qty 1

## 2017-06-11 MED ORDER — CALCIUM CITRATE-VITAMIN D 315-200 MG-UNIT PO TABS: 1.0000 | ORAL_TABLET | Freq: Two times a day (BID) | ORAL | Status: DC

## 2017-06-11 MED ORDER — RIVASTIGMINE 9.5 MG/24HR TD PT24
9.5000 mg | MEDICATED_PATCH | Freq: Every day | TRANSDERMAL | Status: DC
  Administered 2017-06-11 – 2017-06-16 (×6): 9.5 mg via TRANSDERMAL
  Filled 2017-06-11 (×7): qty 1

## 2017-06-11 MED ORDER — MEMANTINE HCL ER 28 MG PO CP24
28.0000 mg | ORAL_CAPSULE | Freq: Every day | ORAL | Status: DC
  Administered 2017-06-11 – 2017-06-16 (×6): 28 mg via ORAL
  Filled 2017-06-11 (×7): qty 1

## 2017-06-11 MED ORDER — LEVOTHYROXINE SODIUM 75 MCG PO TABS
75.0000 ug | ORAL_TABLET | Freq: Every day | ORAL | Status: DC
  Administered 2017-06-12 – 2017-06-17 (×6): 75 ug via ORAL
  Filled 2017-06-11 (×6): qty 1

## 2017-06-11 MED ORDER — TRAZODONE HCL 50 MG PO TABS
25.0000 mg | ORAL_TABLET | Freq: Once | ORAL | Status: AC
  Administered 2017-06-11: 25 mg via ORAL
  Filled 2017-06-11: qty 1

## 2017-06-11 MED ORDER — ALBUTEROL SULFATE (2.5 MG/3ML) 0.083% IN NEBU: 2.5000 mg | INHALATION_SOLUTION | RESPIRATORY_TRACT | Status: DC | PRN

## 2017-06-11 MED ORDER — FAMOTIDINE 20 MG PO TABS
10.0000 mg | ORAL_TABLET | Freq: Every day | ORAL | Status: DC
Start: 2017-06-12 — End: 2017-06-17
  Administered 2017-06-12 – 2017-06-17 (×6): 10 mg via ORAL
  Filled 2017-06-11 (×7): qty 1

## 2017-06-11 MED ORDER — SODIUM CHLORIDE 0.9 % IV SOLN
INTRAVENOUS | Status: DC
  Administered 2017-06-11: 75 mL/h via INTRAVENOUS

## 2017-06-11 MED ORDER — ADULT MULTIVITAMIN W/MINERALS CH: 1.0000 | ORAL_TABLET | Freq: Every day | ORAL | Status: DC

## 2017-06-11 MED ORDER — BUPROPION HCL 75 MG PO TABS
75.0000 mg | ORAL_TABLET | Freq: Every day | ORAL | Status: DC
  Administered 2017-06-11 – 2017-06-17 (×7): 75 mg via ORAL
  Filled 2017-06-11 (×7): qty 1

## 2017-06-11 MED ORDER — METOPROLOL TARTRATE 25 MG PO TABS
25.0000 mg | ORAL_TABLET | Freq: Two times a day (BID) | ORAL | Status: DC
  Administered 2017-06-11 – 2017-06-15 (×8): 25 mg via ORAL
  Filled 2017-06-11 (×8): qty 1

## 2017-06-11 MED ORDER — FUROSEMIDE 20 MG PO TABS
40.0000 mg | ORAL_TABLET | Freq: Every day | ORAL | Status: DC
  Filled 2017-06-11: qty 2

## 2017-06-11 MED ORDER — VITAMIN D 50 MCG (2000 UT) PO TABS: 2000.0000 [IU] | ORAL_TABLET | Freq: Every day | ORAL | Status: DC

## 2017-06-11 MED ORDER — AMLODIPINE BESYLATE 5 MG PO TABS
5.0000 mg | ORAL_TABLET | Freq: Every day | ORAL | Status: DC
  Administered 2017-06-11 – 2017-06-13 (×3): 5 mg via ORAL
  Filled 2017-06-11 (×3): qty 1

## 2017-06-11 MED ORDER — GUAIFENESIN ER 600 MG PO TB12: 600.0000 mg | ORAL_TABLET | Freq: Two times a day (BID) | ORAL | Status: DC | PRN

## 2017-06-11 MED ORDER — LOSARTAN POTASSIUM 50 MG PO TABS
100.0000 mg | ORAL_TABLET | Freq: Every day | ORAL | Status: DC
  Administered 2017-06-11 – 2017-06-14 (×4): 100 mg via ORAL
  Filled 2017-06-11 (×3): qty 2

## 2017-06-11 MED ORDER — VITAMIN D 1000 UNITS PO TABS
2000.0000 [IU] | ORAL_TABLET | Freq: Every day | ORAL | Status: DC
  Filled 2017-06-11: qty 2

## 2017-06-11 MED ORDER — MELATONIN 10 MG PO TABS: 1.0000 | ORAL_TABLET | Freq: Every day | ORAL | Status: DC

## 2017-06-11 MED ORDER — AMLODIPINE BESYLATE 5 MG PO TABS
5.0000 mg | ORAL_TABLET | Freq: Every day | ORAL | Status: DC
  Filled 2017-06-11: qty 1

## 2017-06-11 MED ORDER — MELATONIN 3 MG PO TABS
9.0000 mg | ORAL_TABLET | Freq: Every day | ORAL | Status: DC
  Administered 2017-06-11 – 2017-06-16 (×6): 9 mg via ORAL
  Filled 2017-06-11 (×7): qty 3

## 2017-06-11 MED ORDER — VITAMIN D 1000 UNITS PO TABS
2000.0000 [IU] | ORAL_TABLET | Freq: Every day | ORAL | Status: DC
  Administered 2017-06-11 – 2017-06-17 (×7): 2000 [IU] via ORAL
  Filled 2017-06-11 (×7): qty 2

## 2017-06-11 MED ORDER — LEVOFLOXACIN IN D5W 750 MG/150ML IV SOLN
750.0000 mg | INTRAVENOUS | Status: DC
  Administered 2017-06-11 – 2017-06-13 (×2): 750 mg via INTRAVENOUS
  Filled 2017-06-11 (×2): qty 150

## 2017-06-11 MED ORDER — ADULT MULTIVITAMIN W/MINERALS CH
1.0000 | ORAL_TABLET | Freq: Every day | ORAL | Status: DC
  Administered 2017-06-11 – 2017-06-17 (×7): 1 via ORAL
  Filled 2017-06-11 (×7): qty 1

## 2017-06-11 MED ORDER — DEXTROSE 5 % IV SOLN
1.0000 g | Freq: Once | INTRAVENOUS | Status: AC
  Administered 2017-06-11: 1 g via INTRAVENOUS
  Filled 2017-06-11: qty 10

## 2017-06-11 MED ORDER — APIXABAN 5 MG PO TABS
5.0000 mg | ORAL_TABLET | Freq: Two times a day (BID) | ORAL | Status: DC
  Administered 2017-06-11 – 2017-06-17 (×13): 5 mg via ORAL
  Filled 2017-06-11 (×14): qty 1

## 2017-06-11 MED ORDER — DEXTROSE 5 % IV SOLN: 1.0000 g | INTRAVENOUS | Status: DC

## 2017-06-11 MED ORDER — DEXTROSE 5 % IV SOLN
500.0000 mg | Freq: Once | INTRAVENOUS | Status: DC
  Administered 2017-06-11: 500 mg via INTRAVENOUS
  Filled 2017-06-11: qty 500

## 2017-06-11 MED ORDER — FUROSEMIDE 40 MG PO TABS
40.0000 mg | ORAL_TABLET | Freq: Every day | ORAL | Status: DC
  Administered 2017-06-12 – 2017-06-14 (×3): 40 mg via ORAL
  Filled 2017-06-11 (×4): qty 1

## 2017-06-11 NOTE — Progress Notes (Signed)
Pharmacy Antibiotic Note  [name] is a 83 y.o. female admitted on 06/11/2017 with pneumonia.  Pharmacy has been consulted for levaquin dosing. Pt is afebrile and WBC is WNL. SCr is WNL.   Plan: Levaquin 750mg  IV Q48H F/u renal fxn, C&S, clinical status n  Height: 5\' 7"  (170.2 cm) Weight: 149 lb 14.6 oz (68 kg) IBW/kg (Calculated) : 61.6  Temp (24hrs), Avg:97.9 F (36.6 C), Min:97.9 F (36.6 C), Max:97.9 F (36.6 C)  Recent Labs  Lab 06/11/17 0835  WBC 7.4  CREATININE 0.89    Estimated Creatinine Clearance: 47.4 mL/min (by C-G formula based on SCr of 0.89 mg/dL).    Allergies  Allergen Reactions  . Dilaudid [Hydromorphone Hcl] Other (See Comments)    hallucinations  . Vicodin [Hydrocodone-Acetaminophen] Other (See Comments)    hallucinations    Antimicrobials this admission: Levaquin 12/5>> CTX x 1 12/5  Dose adjustments this admission: N/A  Microbiology results: Pending  Thank you for allowing pharmacy to be a part of this patient's care.  Mickle Campton, Rande Lawman 06/11/2017 12:20 PM

## 2017-06-11 NOTE — ED Notes (Signed)
Patient transported to CT 

## 2017-06-11 NOTE — ED Notes (Signed)
Pt given aplesauce.

## 2017-06-11 NOTE — ED Provider Notes (Signed)
Emergency Department Provider Note   I have reviewed the triage vital signs and the nursing notes.   HISTORY  Chief Complaint Fall and Fatigue   HPI [name] is a 83 y.o. female with PMH of Alzheimer dementia, a-fib on Eliquis, dCHF presents to the emergency department for evaluation of increased drowsiness, low oxygen levels at home, and intermittent confusion.  The patient had an unwitnessed fall today landing on her left side.  She is currently living at home with 24-hour health aide who was able to assist her.  Patient not complaining of pain in the left hip but given that she fell on that side she was brought to the ED for further evaluation.  Family at bedside denies being told about any fevers, vomiting, diarrhea.  The only new medication is Wellbutrin which was started 30 days prior.  Patient not currently on antibiotics.  Patient's underlying dementia limits the HPI and ROS.   Level 5 caveat: Dementia.   Past Medical History:  Diagnosis Date  . 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 Acute congestive heart failure ([place]) 04/19/2014  . Anemia 06/11/2017  . Arthritis   . Atrial fibrillation ([place])   . Atrial fibrillation with slow ventricular response ([place])   . Bradycardia 07/29/2015  . CAP (community acquired pneumonia) 06/11/2017  . Chronic anticoagulation   . Dementia    mild  . Diastolic CHF, chronic ([place])   . Heart murmur   . Hypertension   . Hypertensive heart disease   . Mitral regurgitation   . Nuclear cataract 08/14/2011  . PCO (posterior capsular opacification), right 12/22/2013  . Pseudophakia 12/22/2013  . Second degree AV block     Patient Active Problem List   Diagnosis Date Noted  . CAP (community acquired pneumonia) 06/11/2017  . Acute respiratory failure with hypoxia ([place]) 06/11/2017  . Anemia 06/11/2017  . 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 Bradycardia 07/29/2015  . Atrial fibrillation with slow ventricular response  ([place])   . Second degree AV block   . Diastolic CHF, chronic ([place])   . Chronic anticoagulation   . PCO (posterior capsular opacification), right 12/22/2013  . Pseudophakia 12/22/2013  . Atrial fibrillation ([place])   . Hypertensive heart disease   . Mitral regurgitation   . Dementia 08/01/2012  . Nuclear cataract 08/14/2011    Past Surgical History:  Procedure Laterality Date  . COLONOSCOPY  2002   Remote history of polypectomy  . EP IMPLANTABLE DEVICE N/A 08/03/2015   Procedure: Pacemaker Implant;  Surgeon: Will Meredith Leeds, MD;  Location: [facility];  Service: Cardiovascular;  Laterality: N/A;  . TOTAL HIP ARTHROPLASTY Bilateral    Left in 2007, right in 2001      Allergies Dilaudid [hydromorphone hcl] and Vicodin [hydrocodone-acetaminophen]  Family History  Problem Relation Age of Onset  . Hemophilia Father   . Ulcers Father        bleeding ulcers  . Pneumonia Mother 10  . Hemophilia Mother   . Hemophilia Son   . Stroke Son   . Alzheimer's disease Sister     Social History Social History   Tobacco Use  . Smoking status: Never Smoker  . Smokeless tobacco: Never Used  Substance Use Topics  . Alcohol use: No  . Drug use: No    Review of Systems  Level 5 caveat: Dementia   ____________________________________________   PHYSICAL EXAM:  VITAL SIGNS: ED Triage Vitals [06/11/17 0830]  Enc Vitals Group  BP 137/71     Pulse Rate 65     Resp 17     Temp 97.9 F (36.6 C)     Temp Source Oral     SpO2 95 %   Constitutional: Alert but confused. Frequently dozing off but awakens to voice and follows commands.  Eyes: Conjunctivae are normal. PERRL.  Head: Atraumatic. Nose: No congestion/rhinnorhea. Mouth/Throat: Mucous membranes are moist.  Neck: No stridor.  Cardiovascular: Normal rate, regular rhythm. Good peripheral circulation. Grossly normal heart sounds.   Respiratory: Normal respiratory effort.  No retractions. Lungs  CTAB. Gastrointestinal: Soft and nontender. No distention.  Musculoskeletal: No lower extremity tenderness nor edema. No gross deformities of extremities. Normal passive ROM of bilateral hips and knees.  Neurologic:  Normal speech. No gross focal neurologic deficits are appreciated. No CN deficit. No pronator drift.  Skin:  Skin is warm, dry and intact. No rash noted.  ____________________________________________   LABS (all labs ordered are listed, but only abnormal results are displayed)  Labs Reviewed  COMPREHENSIVE METABOLIC PANEL - Abnormal; Notable for the following components:      Result Value   Sodium 130 (*)    Chloride 94 (*)    Glucose, Bld 103 (*)    Albumin 3.4 (*)    GFR calc non Af Amer 59 (*)    All other components within normal limits  CBC - Abnormal; Notable for the following components:   RBC 3.66 (*)    Hemoglobin 10.9 (*)    HCT 33.4 (*)    All other components within normal limits  BASIC METABOLIC PANEL - Abnormal; Notable for the following components:   Sodium 130 (*)    Chloride 98 (*)    Glucose, Bld 116 (*)    Calcium 8.6 (*)    All other components within normal limits  TSH - Abnormal; Notable for the following components:   TSH 6.830 (*)    All other components within normal limits  RETICULOCYTES - Abnormal; Notable for the following components:   RBC. 3.75 (*)    All other components within normal limits  URINE CULTURE  CULTURE, EXPECTORATED SPUTUM-ASSESSMENT  GRAM STAIN  URINALYSIS, ROUTINE W REFLEX MICROSCOPIC  SODIUM, URINE, RANDOM  VITAMIN B12  FOLATE  IRON AND TIBC  FERRITIN  INFLUENZA PANEL BY PCR (TYPE A & B)  LEGIONELLA PNEUMOPHILA SEROGP 1 UR AG  STREP PNEUMONIAE URINARY ANTIGEN  COMPREHENSIVE METABOLIC PANEL  CBC   ____________________________________________  EKG   EKG Interpretation  Date/Time:  Wednesday June 11 2017 08:42:42 EST Ventricular Rate:  60 PR Interval:    QRS Duration: 178 QT Interval:  502 QTC  Calculation: 502 R Axis:   -74 Text Interpretation:  Ventricular-paced rhythm Abnormal ECG Confirmed by Nanda Quinton [phone]) on 06/11/2017 8:03:44 PM       ____________________________________________  RADIOLOGY  Dg Chest 2 View  Result Date: 06/11/2017 CLINICAL DATA:  Shortness of breath, hypoxemia. EXAM: CHEST  2 VIEW COMPARISON:  Radiographs of August 04, 2015. FINDINGS: Stable cardiomegaly. No pneumothorax is noted. Single lead left-sided pacemaker is unchanged in position. Bilateral basilar opacities are noted, left greater than right, concerning for edema or pneumonia. Moderate left pleural effusion is noted as well. Bony thorax is unremarkable. IMPRESSION: Bilateral basilar edema or pneumonia is noted,, left greater than right. Moderate left pleural effusion is noted. Electronically Signed   By: Marijo Conception, M.D.   On: 06/11/2017 10:17   Ct Head Wo Contrast  Result Date: 06/11/2017  CLINICAL DATA:  Status post fall.  Increased lethargy. EXAM: CT HEAD WITHOUT CONTRAST TECHNIQUE: Contiguous axial images were obtained from the base of the skull through the vertex without intravenous contrast. COMPARISON:  08/28/2012 FINDINGS: Brain: No evidence of acute infarction, hemorrhage, extra-axial collection, ventriculomegaly, or mass effect. Generalized cerebral atrophy. Periventricular white matter low attenuation likely secondary to microangiopathy. Vascular: Cerebrovascular atherosclerotic calcifications are noted. Skull: Negative for fracture or focal lesion. Sinuses/Orbits: Visualized portions of the orbits are unremarkable. Visualized portions of the paranasal sinuses and mastoid air cells are unremarkable. Other: None. IMPRESSION: 1. No acute intracranial pathology. Electronically Signed   By: Kathreen Devoid   On: 06/11/2017 11:18   Dg Hip Unilat With Pelvis 2-3 Views Left  Result Date: 06/11/2017 CLINICAL DATA:  Left hip pain after a mechanical fall while walking this morning. The patient  fell onto the left hip. EXAM: DG HIP (WITH OR WITHOUT PELVIS) 2-3V LEFT COMPARISON:  AP pelvis and right hip series of August 01, 2012 FINDINGS: The bones are subjectively osteopenic. There is a prosthetic left hip joint. Radiographic positioning of the prosthetic components is good. The interface with the native bone appears normal. No acute native bone abnormality is observed. IMPRESSION: There is no acute abnormality of the prosthetic left hip joint nor of the native bone. Electronically Signed   By: David  Martinique M.D.   On: 06/11/2017 09:24    ____________________________________________   PROCEDURES  Procedure(s) performed:   Procedures  None ____________________________________________   INITIAL IMPRESSION / ASSESSMENT AND PLAN / ED COURSE  Pertinent labs & imaging results that were available during my care of the patient were reviewed by me and considered in my medical decision making (see chart for details).  Patient presents to the emergency department for evaluation after mechanical fall at home.  She has baseline dementia with home health aide.  Family reports worsening fatigue, mild hypoxemia, and intermittent confusion.  Patient is on Eliquis.  In the setting of unwitnessed fall plan to add CT head and a chest x-ray with mild hypoxemia.  X-ray of the left hip from triage shows no acute abnormality.  Labs pending.  Added UA.   Patient with PNA on CXR. O2 started with borderline hypoxemia here. Plan for CAP coverage and admission.  Discussed patient's case with Hospitalist to request admission. Patient and family (if present) updated with plan. Care transferred to Hospitalist service.  I reviewed all nursing notes, vitals, pertinent old records, EKGs, labs, imaging (as available).  ____________________________________________  FINAL CLINICAL IMPRESSION(S) / ED DIAGNOSES  Final diagnoses:  Community acquired pneumonia of left lung, unspecified part of lung  Hypoxemia      MEDICATIONS GIVEN DURING THIS VISIT:  Medications  ALPRAZolam (XANAX) tablet 0.125 mg (0.125 mg Oral Given 06/11/17 1438)  apixaban (ELIQUIS) tablet 5 mg (5 mg Oral Given 06/11/17 1440)  buPROPion (WELLBUTRIN) tablet 75 mg (75 mg Oral Given 06/11/17 1439)  levothyroxine (SYNTHROID, LEVOTHROID) tablet 75 mcg (not administered)  losartan (COZAAR) tablet 100 mg (100 mg Oral Given 06/11/17 1439)  memantine (NAMENDA XR) 24 hr capsule 28 mg (not administered)  famotidine (PEPCID) tablet 10 mg (not administered)  0.9 %  sodium chloride infusion (75 mL/hr Intravenous Transfusing/Transfer 06/11/17 1635)  levofloxacin (LEVAQUIN) IVPB 750 mg (0 mg Intravenous Stopped 06/11/17 1558)  albuterol (PROVENTIL) (2.5 MG/3ML) 0.083% nebulizer solution 2.5 mg (not administered)  guaiFENesin (MUCINEX) 12 hr tablet 600 mg (not administered)  metoprolol tartrate (LOPRESSOR) tablet 25 mg (not administered)  calcium-vitamin D (OSCAL  WITH D) 500-200 MG-UNIT per tablet 1 tablet (not administered)  furosemide (LASIX) tablet 40 mg (40 mg Oral Not Given 06/11/17 1838)  multivitamin with minerals tablet 1 tablet (1 tablet Oral Given 06/11/17 1839)  cholecalciferol (VITAMIN D) tablet 2,000 Units (2,000 Units Oral Given 06/11/17 1838)  amLODipine (NORVASC) tablet 5 mg (5 mg Oral Given 06/11/17 1838)  Melatonin TABS 9 mg (not administered)  rivastigmine (EXELON) 9.5 mg/24hr 9.5 mg (not administered)  cefTRIAXone (ROCEPHIN) 1 g in dextrose 5 % 50 mL IVPB (0 g Intravenous Stopped 06/11/17 1244)    Note:  This document was prepared using Dragon voice recognition software and may include unintentional dictation errors.  Nanda Quinton, MD Emergency Medicine    Keirsten Matuska, [facility], MD 06/11/17 2005

## 2017-06-11 NOTE — ED Triage Notes (Signed)
Pt to ER for evaluation of left hip pain after mechanical fall this morning while walking. States patient has dementia, continues to live at home with around the clock care. Pt fell onto left hip, did not hit her head. Family is concerned for fatigue that has been going on since yesterday, states goes to an adult center and is usually very participative however slept all day yesterday. Pt is lethargic in triage but arrousable when spoken to.

## 2017-06-11 NOTE — ED Notes (Signed)
Hooked patient up to the monitor patient is resting with family at bedside and call bell in reach

## 2017-06-11 NOTE — ED Notes (Signed)
Pt placed on 2lnc. 

## 2017-06-11 NOTE — H&P (Signed)
History and Physical    TUWANNA KRAUSZ FTD:[id number] DOB: [birth year] DOA: 06/11/2017   PCP: Lajean Manes, MD   Patient coming from:  Home    Chief Complaint: Left  Hip pain   HPI: [name] is a 83 y.o. female with history of Alzheimer's dementia, living at home with around-the-clock care, atrial fibrillation on Eliquis, diastolic heart failure, presenting to the ED with 2-3-day history of increased drowsiness, especially over the last day.  History is obtained by her son who is at bedside, as the patient is a level 5 caveat due to lethargy and dementia.  She had increased fatigue, and today, she had a unwitnessed fall, landing on her left side.  Patient was not complaining of pain in the left hip, or have any bleeding issues.  Family denies being told about any fevers, chills, night sweats, nausea, vomiting or diarrhea.  She was urinating well, without expressing any pain in the lower abdomen, or having hematuria.  The only new medication is Wellbutrin, started 3 days ago.  The rest of the medications, she takes on a regular basis, and is compliant to them.  Is not on any antibiotics, and she may have had a sick contact at the recreational center that she attends.  Family did not notice any unilateral weakness, or the patient having any trouble swallowing, or vision changes.  No syncope or presyncope.  He did not have any other falls prior to this event.   ED Course:  BP 116/63   Pulse (!) 55   Temp 97.9 F (36.6 C) (Oral)   Resp 19   Ht 5\' 7"  (1.702 m)   Wt 68 kg (149 lb 14.6 oz)   SpO2 93%   BMI 23.48 kg/m   Creatinine 0.89 Urinalysis shows negative for leukocytes or nitrite W BC 7.4 Hemoglobin 10.9  Chest x-ray bilateral basilar edema or pneumonia is noted,, left greater than right. Moderate left pleural effusion is noted. CT of the head is negative for acute findings Left hip x-ray is negative for fracture  Review of Systems: As per HPI otherwise 10 point review  of systems negative.   Past Medical History:  Diagnosis Date  . 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 Acute congestive heart failure ([place]) 04/19/2014  . Arthritis   . Atrial fibrillation ([place])   . Atrial fibrillation with slow ventricular response ([place])   . Bradycardia 07/29/2015  . CAP (community acquired pneumonia) 06/11/2017  . Chronic anticoagulation   . Dementia    mild  . Diastolic CHF, chronic ([place])   . Heart murmur   . Hypertension   . Hypertensive heart disease   . Mitral regurgitation   . Nuclear cataract 08/14/2011  . PCO (posterior capsular opacification), right 12/22/2013  . Pseudophakia 12/22/2013  . Second degree AV block     Past Surgical History:  Procedure Laterality Date  . COLONOSCOPY  2002   Remote history of polypectomy  . EP IMPLANTABLE DEVICE N/A 08/03/2015   Procedure: Pacemaker Implant;  Surgeon: Will Meredith Leeds, MD;  Location: [facility];  Service: Cardiovascular;  Laterality: N/A;  . TOTAL HIP ARTHROPLASTY Bilateral    Left in 2007, right in 2001    Social History Social History   Socioeconomic History  . Marital status: Divorced    Spouse name: Not on file  . Number of children: Not on file  . Years of education: Not on file  . Highest education level: Not on file  Social Needs  . Financial resource strain: Not on file  . Food insecurity - worry: Not on file  . Food insecurity - inability: Not on file  . Transportation needs - medical: Not on file  . Transportation needs - non-medical: Not on file  Occupational History  . Occupation: Retired  Tobacco Use  . Smoking status: Never Smoker  . Smokeless tobacco: Never Used  Substance and Sexual Activity  . Alcohol use: No  . Drug use: No  . Sexual activity: Not on file  Other Topics Concern  . Not on file  Social History Narrative   She carries the hemophilia gene, her mother and her son both have disease.      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      Allergies  Allergen Reactions  . Dilaudid [Hydromorphone Hcl] Other (See Comments)    hallucinations  . Vicodin [Hydrocodone-Acetaminophen] Other (See Comments)    hallucinations    Family History  Problem Relation Age of Onset  . Hemophilia Father   . Ulcers Father        bleeding ulcers  . Pneumonia Mother 71  . Hemophilia Mother   . Hemophilia Son   . Stroke Son   . Alzheimer's disease Sister       Prior to Admission medications   Medication Sig Start Date End Date Taking? Authorizing Provider  acetaminophen (TYLENOL) 500 MG tablet Take 500 mg by mouth 2 (two) times daily as needed for moderate pain.   Yes [provider]  ALPRAZolam (XANAX) 0.25 MG tablet Take 0.125 mg by mouth 2 (two) times daily as needed for anxiety.   Yes [provider]  amLODipine (NORVASC) 5 MG tablet  03/19/17  Yes [provider]  apixaban (ELIQUIS) 5 MG TABS tablet Take 1 tablet (5 mg total) by mouth 2 (two) times daily. 11/22/16  Yes Jettie Booze, MD  buPROPion [facility]) 75 MG tablet Take 1 tablet daily 05/12/17  Yes Cameron Sprang, MD  Calcium Carbonate-Vitamin D (CALCIUM + D PO) Take 1 tablet by mouth 2 (two) times daily.   Yes [provider]  Cholecalciferol (VITAMIN D) 2000 UNITS tablet Take 2,000 Units by mouth daily.   Yes [provider]  furosemide (LASIX) 40 MG tablet Take 1 tablet (40 mg total) by mouth daily. 04/22/14  Yes Jacolyn Reedy, MD  levothyroxine (SYNTHROID, LEVOTHROID) 75 MCG tablet Take 75 mcg by mouth daily before breakfast.   Yes [provider]  losartan (COZAAR) 100 MG tablet TAKE 1 TABLET BY MOUTH DAILY 09/17/16  Yes Camnitz, Ocie Doyne, MD  Melatonin 10 MG TABS Take 1 tablet by mouth at bedtime.   Yes [provider]  Memantine HCl ER (NAMENDA XR) 28 MG CP24 Take 28 mg by mouth at bedtime. Reported on 08/21/2015   Yes [provider]  metoprolol tartrate (LOPRESSOR) 25 MG tablet TAKE 1 TABLET(25 MG) BY MOUTH TWICE DAILY 07/26/16  Yes Camnitz, Ocie Doyne, MD  Multiple Vitamin (MULTIVITAMIN WITH MINERALS) TABS Take 1 tablet by mouth daily.   Yes [provider]  ranitidine (ZANTAC) 75 MG tablet Take 75 mg by mouth daily.   Yes [provider]  rivastigmine (EXELON) 9.5 mg/24hr Place 1 patch onto  the skin daily.   Yes [provider]  traZODone (DESYREL) 50 MG tablet Take 25 mg by mouth at bedtime.   Yes [provider]    Physical Exam:  Vitals:   06/11/17 1115 06/11/17 1130 06/11/17 1145 06/11/17 1200  BP: 116/63 126/68 125/67 116/63  Pulse: 61 64 (!) 55   Resp: 17 (!) 24 18 19   Temp:      TempSrc:      SpO2: (!) 88% 91% 93%   Weight:    68 kg (149 lb 14.6 oz)  Height:    5\' 7"  (1.702 m)   Constitutional: NAD, calm,very lethargic, responds to verbal stimuli  Eyes: PERRL, lids and conjunctivae normal ENMT: Mucous membranes are moist, without exudate or lesions  Neck: normal, supple, no masses, no thyromegaly Respiratory: decreased breath sounds on the left , no wheezing, no crackles. Normal respiratory effort  Cardiovascular: Regular rate and rhythm, no murmurs, rubs or gallops. No extremity edema. 2+ pedal pulses. No carotid bruits.  Abdomen: Soft, non tender, No hepatosplenomegaly. Bowel sounds positive.  Musculoskeletal: no clubbing / cyanosis. Moves all extremities Skin: no jaundice, No lesions.  Skin is somewhat dry Neurologic: Sensation intact  Strength unable to test as patient cannot follow commands at this time.       Labs on Admission: I have personally reviewed following labs and imaging studies  CBC: Recent Labs  Lab 06/11/17 0835  WBC 7.4  HGB 10.9*  HCT 33.4*  MCV 91.3  PLT 785    Basic Metabolic Panel: Recent  Labs  Lab 06/11/17 0835  NA 130*  K 3.6  CL 94*  CO2 24  GLUCOSE 103*  BUN 14  CREATININE 0.89  CALCIUM 9.0    GFR: Estimated Creatinine Clearance: 47.4 mL/min (by C-G formula based on SCr of 0.89 mg/dL).  Liver Function Tests: Recent Labs  Lab 06/11/17 0835  AST 26  ALT 15  ALKPHOS 59  BILITOT 1.1  PROT 6.7  ALBUMIN 3.4*   No results for input(s): LIPASE, AMYLASE in the last 168 hours. No results for input(s): AMMONIA in the last 168 hours.  Coagulation Profile: No results for input(s): INR, PROTIME in the last 168 hours.  Cardiac Enzymes: No results for input(s): CKTOTAL, CKMB, CKMBINDEX, TROPONINI in the last 168 hours.  BNP (last 3 results) No results for input(s): PROBNP in the last 8760 hours.  HbA1C: No results for input(s): HGBA1C in the last 72 hours.  CBG: No results for input(s): GLUCAP in the last 168 hours.  Lipid Profile: No results for input(s): CHOL, HDL, LDLCALC, TRIG, CHOLHDL, LDLDIRECT in the last 72 hours.  Thyroid Function Tests: No results for input(s): TSH, T4TOTAL, FREET4, T3FREE, THYROIDAB in the last 72 hours.  Anemia Panel: No results for input(s): VITAMINB12, FOLATE, FERRITIN, TIBC, IRON, RETICCTPCT in the last 72 hours.  Urine analysis:    Component Value Date/Time   COLORURINE YELLOW 06/11/2017 1028   APPEARANCEUR CLEAR 06/11/2017 1028   LABSPEC 1.013 06/11/2017 1028   PHURINE 6.0 06/11/2017 1028   GLUCOSEU NEGATIVE 06/11/2017 1028   HGBUR NEGATIVE 06/11/2017 1028   BILIRUBINUR NEGATIVE 06/11/2017 1028   KETONESUR NEGATIVE 06/11/2017 1028   PROTEINUR NEGATIVE 06/11/2017 1028   UROBILINOGEN 0.2 04/19/2014 1430   NITRITE NEGATIVE 06/11/2017 1028   LEUKOCYTESUR NEGATIVE 06/11/2017 1028    Sepsis Labs: @LABRCNTIP (procalcitonin:4,lacticidven:4) )No results found for this or any previous visit (from the past 240 hour(s)).   Radiological Exams on Admission: Dg Chest 2 View  Result Date: 06/11/2017 CLINICAL DATA:   Shortness of breath, hypoxemia. EXAM: CHEST  2 VIEW COMPARISON:  Radiographs of August 04, 2015. FINDINGS: Stable cardiomegaly. No pneumothorax is noted. Single lead left-sided pacemaker is unchanged in position. Bilateral basilar opacities are noted, left greater than right, concerning for edema or pneumonia. Moderate left pleural effusion is noted as well. Bony thorax is unremarkable. IMPRESSION: Bilateral basilar edema or pneumonia is noted,, left greater than right. Moderate left pleural effusion is noted. Electronically Signed   By: Marijo Conception, M.D.   On: 06/11/2017 10:17   Ct Head Wo Contrast  Result Date: 06/11/2017 CLINICAL DATA:  Status post fall.  Increased lethargy. EXAM: CT HEAD WITHOUT CONTRAST TECHNIQUE: Contiguous axial images were obtained from the base of the skull through the vertex without intravenous contrast. COMPARISON:  08/28/2012 FINDINGS: Brain: No evidence of acute infarction, hemorrhage, extra-axial collection, ventriculomegaly, or mass effect. Generalized cerebral atrophy. Periventricular white matter low attenuation likely secondary to microangiopathy. Vascular: Cerebrovascular atherosclerotic calcifications are noted. Skull: Negative for fracture or focal lesion. Sinuses/Orbits: Visualized portions of the orbits are unremarkable. Visualized portions of the paranasal sinuses and mastoid air cells are unremarkable. Other: None. IMPRESSION: 1. No acute intracranial pathology. Electronically Signed   By: Kathreen Devoid   On: 06/11/2017 11:18   Dg Hip Unilat With Pelvis 2-3 Views Left  Result Date: 06/11/2017 CLINICAL DATA:  Left hip pain after a mechanical fall while walking this morning. The patient fell onto the left hip. EXAM: DG HIP (WITH OR WITHOUT PELVIS) 2-3V LEFT COMPARISON:  AP pelvis and right hip series of August 01, 2012 FINDINGS: The bones are subjectively osteopenic. There is a prosthetic left hip joint. Radiographic positioning of the prosthetic components is  good. The interface with the native bone appears normal. No acute native bone abnormality is observed. IMPRESSION: There is no acute abnormality of the prosthetic left hip joint nor of the native bone. Electronically Signed   By: David  Martinique M.D.   On: 06/11/2017 09:24    EKG: Independently reviewed.  Assessment/Plan Active Problems:   CAP (community acquired pneumonia)   Acute respiratory failure with hypoxia (HCC)   Dementia   Atrial fibrillation (HCC)   Hypertensive heart disease   Mitral regurgitation   Chronic anticoagulation   Diastolic CHF, chronic (HCC)    Acute Respiratory Failure with Hypoxia likely due to likely aspiration pneumonia, as evidenced by chest x-ray.  Patient presented with increased lethargy, nonproductive cough, white count is normal.  influenza panel pending. Patient is afebrile.  On presentation, her O2 sats were in the 80s, better controlled in the low 90s with 2 L of oxygen.  Initially it was treated with Rocephin and azithromycin, but this is suspected to be due to aspiration Admit to inpatient telemetry Oxygen continues sputum cultures  IV antibiotics with per protocol Nebulizers as needed, with albuterol every 4 hours as needed for wheezing Mucinex prn  Gentle IV fluids due to history of congestive heart failure antipyretics as needed Repeat CBC in am  Mechanical fall, in the setting of above, landing on the left hip, with negative hip x-ray.  CT of the head is negative for acute findings. White count is normal.  She is afebrile. Urine is negative. Fall precautions  PT/OT  Hypertension BP 116/63   Pulse  55   Controlled Continue home anti-hypertensive medications   Hyponatremia  likely due to decrease oral intake and diuretics currently 130, baseline 135, patient lethargic   IV Normal  saline, expected to improve   follow BMP at 1900 to avoid more than 10 meq in 10 hrs   Fluid restriction at 1500cc  Urine sodium  Hold lasix    Atrial  Fibrillation  on anticoagulation with Eliquis   Rate controlled Continue meds, including Eliquis     Diastolic CHF: No acute decompensation weight 150 lbs EF between 45 and 50%, with mildly reduced systolic function  continue meds  Obtain daily weights Monitor intake and output  Anemia, chronic Hb 10.9, no bleeding issues reported. Was 14 one year ago  Check anemia panel in view of increased debility    Hyperlipidemia Continue home statins   GERD, no acute symptoms Continue PPI   Hypothyroidism: Continue home Synthroid  Alzheimer's dementia Continue Namenda, Wellbutrin PT and OT    DVT prophylaxis: Eliquis  code Status:   Full   Family Communication:  Discussed with patient Disposition Plan: Expect patient to be discharged to home after condition improves Consults called:    None Admission status: Inpatient telemetry  Sharene Butters, PA-C [facility]   06/11/2017, 12:17 PM

## 2017-06-11 NOTE — ED Notes (Signed)
Attmepted to call report

## 2017-06-11 NOTE — ED Notes (Signed)
Switched diet to soft and asked to take tray to [phone]

## 2017-06-12 ENCOUNTER — Inpatient Hospital Stay (HOSPITAL_COMMUNITY): Payer: Medicare Other

## 2017-06-12 ENCOUNTER — Other Ambulatory Visit: Payer: Self-pay

## 2017-06-12 DIAGNOSIS — R0902 Hypoxemia: Secondary | ICD-10-CM

## 2017-06-12 DIAGNOSIS — J9601 Acute respiratory failure with hypoxia: Secondary | ICD-10-CM

## 2017-06-12 LAB — COMPREHENSIVE METABOLIC PANEL
ALBUMIN: 3.2 g/dL — AB (ref 3.5–5.0)
ALK PHOS: 51 U/L (ref 38–126)
ALT: 16 U/L (ref 14–54)
ANION GAP: 13 (ref 5–15)
AST: 30 U/L (ref 15–41)
BILIRUBIN TOTAL: 1.2 mg/dL (ref 0.3–1.2)
BUN: 10 mg/dL (ref 6–20)
CHLORIDE: 97 mmol/L — AB (ref 101–111)
CO2: 19 mmol/L — AB (ref 22–32)
Calcium: 8.7 mg/dL — ABNORMAL LOW (ref 8.9–10.3)
Creatinine, Ser: 0.8 mg/dL (ref 0.44–1.00)
GFR calc Af Amer: >60 mL/min (ref >60)
GFR calc non Af Amer: >60 mL/min (ref >60)
GLUCOSE: 132 mg/dL — AB (ref 65–99)
Potassium: 3.8 mmol/L (ref 3.5–5.1)
Sodium: 129 mmol/L — ABNORMAL LOW (ref 135–145)
Total Protein: 6.2 g/dL — ABNORMAL LOW (ref 6.5–8.1)

## 2017-06-12 LAB — CBC
HEMATOCRIT: 33.3 % — AB (ref 36.0–46.0)
HEMOGLOBIN: 10.9 g/dL — AB (ref 12.0–15.0)
MCH: 29.9 pg (ref 26.0–34.0)
MCHC: 32.7 g/dL (ref 30.0–36.0)
MCV: 91.5 fL (ref 78.0–100.0)
Platelets: 254 10*3/uL (ref 150–400)
RBC: 3.64 MIL/uL — AB (ref 3.87–5.11)
RDW: 14.5 % (ref 11.5–15.5)
WBC: 8.7 10*3/uL (ref 4.0–10.5)

## 2017-06-12 LAB — URINE CULTURE
Culture: NO GROWTH
SPECIAL REQUESTS: NORMAL

## 2017-06-12 LAB — STREP PNEUMONIAE URINARY ANTIGEN: STREP PNEUMO URINARY ANTIGEN: NEGATIVE

## 2017-06-12 LAB — LEGIONELLA PNEUMOPHILA SEROGP 1 UR AG: L. pneumophila Serogp 1 Ur Ag: NEGATIVE

## 2017-06-12 MED ORDER — ALPRAZOLAM 0.25 MG PO TABS
0.1250 mg | ORAL_TABLET | Freq: Once | ORAL | Status: AC
  Administered 2017-06-12: 0.125 mg via ORAL
  Filled 2017-06-12: qty 1

## 2017-06-12 MED ORDER — FUROSEMIDE 40 MG PO TABS
40.0000 mg | ORAL_TABLET | Freq: Once | ORAL | Status: AC
  Administered 2017-06-12: 40 mg via ORAL
  Filled 2017-06-12: qty 1

## 2017-06-12 MED ORDER — TRAZODONE HCL 50 MG PO TABS
50.0000 mg | ORAL_TABLET | Freq: Every day | ORAL | Status: DC
  Administered 2017-06-12 – 2017-06-16 (×5): 50 mg via ORAL
  Filled 2017-06-12 (×5): qty 1

## 2017-06-12 MED ORDER — LACTULOSE 10 GM/15ML PO SOLN
20.0000 g | Freq: Once | ORAL | Status: AC
  Administered 2017-06-12: 20 g via ORAL
  Filled 2017-06-12: qty 30

## 2017-06-12 MED ORDER — ORAL CARE MOUTH RINSE
15.0000 mL | Freq: Two times a day (BID) | OROMUCOSAL | Status: DC
  Administered 2017-06-12 – 2017-06-16 (×8): 15 mL via OROMUCOSAL

## 2017-06-12 MED ORDER — ALPRAZOLAM 0.25 MG PO TABS
0.1250 mg | ORAL_TABLET | Freq: Three times a day (TID) | ORAL | Status: DC | PRN
Start: 2017-06-12 — End: 2017-06-17
  Administered 2017-06-12 – 2017-06-17 (×11): 0.125 mg via ORAL
  Filled 2017-06-12 (×11): qty 1

## 2017-06-12 MED ORDER — POLYETHYLENE GLYCOL 3350 17 G PO PACK
17.0000 g | PACK | Freq: Every day | ORAL | Status: DC
  Administered 2017-06-12 – 2017-06-17 (×4): 17 g via ORAL
  Filled 2017-06-12 (×6): qty 1

## 2017-06-12 MED ORDER — POTASSIUM CHLORIDE CRYS ER 20 MEQ PO TBCR
20.0000 meq | EXTENDED_RELEASE_TABLET | Freq: Every day | ORAL | Status: DC
  Administered 2017-06-12 – 2017-06-16 (×5): 20 meq via ORAL
  Filled 2017-06-12 (×5): qty 1

## 2017-06-12 MED ORDER — ALPRAZOLAM 0.25 MG PO TABS
0.2500 mg | ORAL_TABLET | Freq: Every day | ORAL | Status: DC
  Administered 2017-06-12 – 2017-06-16 (×5): 0.25 mg via ORAL
  Filled 2017-06-12 (×5): qty 1

## 2017-06-12 MED ORDER — BISACODYL 5 MG PO TBEC: 10.0000 mg | DELAYED_RELEASE_TABLET | Freq: Every day | ORAL | Status: DC | PRN

## 2017-06-12 NOTE — Plan of Care (Signed)
  New goals per BSE SLP Dysphagia Goals Patient will utilize recommended strategies Description Patient will utilize recommended strategies during swallow to increase swallowing safety with 06/12/2017 1121 by Colon Flattery B, CCC-SLP Flowsheets Taken 06/12/2017 1121  Patient will utilize recommended strategies during swallow to increase swallowing safety with  mod assist Misc Dysphagia Goal 06/12/2017 1121 by Colon Flattery B, CCC-SLP Flowsheets Taken 06/12/2017 1121  Misc Dysphagia Goal  Pt will tolerate least restrictive diet without overt s/s aspiration or decline in respiratory status.

## 2017-06-12 NOTE — Progress Notes (Addendum)
Patient son is concerned about patients abdomen larger than normal . Paged MD because no prior BM noted and patient family unable to remember when last BM. Also lasix given this a/m/ to continue to pull fluid off   New orders- mirilax daily, lactulose once, and dulcolax supp as needed

## 2017-06-12 NOTE — Progress Notes (Signed)
PROGRESS NOTE    [name]  OHY:[id number] DOB: 1932/09/04 DOA: 06/11/2017 PCP: Lajean Manes, MD   Brief Narrative:84 y.o. female with history of Alzheimer's dementia, living at home with around-the-clock care, atrial fibrillation on Eliquis, diastolic heart failure, presenting to the ED with 2-3-day history of increased drowsiness, especially over the last day.  History is obtained by her son who is at bedside, as the patient is a level 5 caveat due to lethargy and dementia.  She had increased fatigue, and today, she had a unwitnessed fall, landing on her left side.  Patient was not complaining of pain in the left hip, or have any bleeding issues.  Family denies being told about any fevers, chills, night sweats, nausea, vomiting or diarrhea.  She was urinating well, without expressing any pain in the lower abdomen, or having hematuria.  The only new medication is Wellbutrin, started 3 days ago.  The rest of the medications, she takes on a regular basis, and is compliant to them.  Is not on any antibiotics, and she may have had a sick contact at the recreational center that she attends.  Family did not notice any unilateral weakness, or the patient having any trouble swallowing, or vision changes.  No syncope or presyncope.  He did not have any other falls prior to this event.   ED Course:  BP 116/63   Pulse (!) 55   Temp 97.9 F (36.6 C) (Oral)   Resp 19   Ht 5\' 7"  (1.702 m)   Wt 68 kg (149 lb 14.6 oz)   SpO2 93%   BMI 23.48 kg/m   Creatinine 0.89 Urinalysis shows negative for leukocytes or nitrite W BC 7.4 Hemoglobin 10.9  Chest x-ray bilateral basilar edema or pneumonia is noted,, left greater than right. Moderate left pleural effusion is noted. CT of the head is negative for acute findings Left hip x-ray is negative for fracture    Assessment & Plan:   Active Problems:   Dementia   Atrial fibrillation (HCC)   Hypertensive heart disease   Mitral regurgitation  Chronic anticoagulation   Diastolic CHF, chronic (HCC)   CAP (community acquired pneumonia)   Acute respiratory failure with hypoxia (HCC)   Anemia  1] acute respiratory failure with hypoxia secondary to fluid overload pleural effusion/possible atypical or aspiration pneumonia-patient receives Lasix 40 mg daily at home she has a history of congestive heart failure.  She is also put on antibiotics at this time for possible pneumonia.  Will monitor closely and adjust the dose and titrate the dose of antibiotics as needed pharmacy following.  Follow-up sputum culture.  Continue Mucinex and nebulizers.  DC IV fluids.add kdur.  Chest x-ray was done today results are pending at this time.  2]hyponatremia-chronic.follow levels.avoid hypotonic fluids.  3}htn continue norvsc,cozaar.  4] hypothyroidism continue Synthroid.   DVT prophylaxis: Apixaban Code Status DNR :Family Communication: Discussed in detail with patient's son who understands and agree.  The plan will be for patient to go back home with 24-hour care.  Patient requested social worker to speak to him. Disposition Plan: TBD  Consultants: None Procedures: None Antimicrobials: Levofloxacin.  Patient received a dose of Rocephin and azithromycin in the ER.  Subjective: Denies shortness of breath chest pain family reports that patient became short of breath early this morning and was given a dose of Lasix which was not given since admission as an family refused the Lasix as it was too late in the nite.  Objective: Patient  is sitting up in chair.  In no acute distress son by the bedside Vitals:   06/12/17 0422 06/12/17 0524 06/12/17 0656 06/12/17 0939  BP:  (!) 100/56  131/65  Pulse:  63  (!) 59  Resp:  18    Temp:  98.2 F (36.8 C)    TempSrc:  Oral    SpO2: 90% 98%    Weight:   68 kg (150 lb)   Height:        Intake/Output Summary (Last 24 hours) at 06/12/2017 0952 Last data filed at 06/12/2017 0910 Gross per 24 hour  Intake  712.5 ml  Output 2200 ml  Net -1487.5 ml   Filed Weights   06/11/17 1200 06/12/17 0656  Weight: 68 kg (149 lb 14.6 oz) 68 kg (150 lb)    Examination:  General exam: Appears calm and comfortable  Respiratory system: crackles left base auscultation. Respiratory effort normal. Cardiovascular system: S1 & S2 heard, RRR. No JVD, murmurs, rubs, gallops or clicks. No pedal edema. Gastrointestinal system: Abdomen is nondistended, soft and nontender. No organomegaly or masses felt. Normal bowel sounds heard. Central nervous system: Alert and oriented. No focal neurological deficits. Extremities: Symmetric 5 x 5 power. Skin: No rashes, lesions or ulcers Psychiatry: Judgement and insight appear normal. Mood & affect appropriate.     Data Reviewed: I have personally reviewed following labs and imaging studies  CBC: Recent Labs  Lab 06/11/17 0835 06/12/17 0516  WBC 7.4 8.7  HGB 10.9* 10.9*  HCT 33.4* 33.3*  MCV 91.3 91.5  PLT 261 557   Basic Metabolic Panel: Recent Labs  Lab 06/11/17 0835 06/11/17 1700 06/12/17 0516  NA 130* 130* 129*  K 3.6 3.7 3.8  CL 94* 98* 97*  CO2 24 22 19*  GLUCOSE 103* 116* 132*  BUN 14 11 10   CREATININE 0.89 0.73 0.80  CALCIUM 9.0 8.6* 8.7*   GFR: Estimated Creatinine Clearance: 52.7 mL/min (by C-G formula based on SCr of 0.8 mg/dL). Liver Function Tests: Recent Labs  Lab 06/11/17 0835 06/12/17 0516  AST 26 30  ALT 15 16  ALKPHOS 59 51  BILITOT 1.1 1.2  PROT 6.7 6.2*  ALBUMIN 3.4* 3.2*   No results for input(s): LIPASE, AMYLASE in the last 168 hours. No results for input(s): AMMONIA in the last 168 hours. Coagulation Profile: No results for input(s): INR, PROTIME in the last 168 hours. Cardiac Enzymes: No results for input(s): CKTOTAL, CKMB, CKMBINDEX, TROPONINI in the last 168 hours. BNP (last 3 results) No results for input(s): PROBNP in the last 8760 hours. HbA1C: No results for input(s): HGBA1C in the last 72 hours. CBG: No  results for input(s): GLUCAP in the last 168 hours. Lipid Profile: No results for input(s): CHOL, HDL, LDLCALC, TRIG, CHOLHDL, LDLDIRECT in the last 72 hours. Thyroid Function Tests: Recent Labs    06/11/17 1741  TSH 6.830*   Anemia Panel: Recent Labs    06/11/17 1700  VITAMINB12 589  FOLATE 42.0  FERRITIN 78  TIBC 398  IRON 67  RETICCTPCT 2.7   Sepsis Labs: No results for input(s): PROCALCITON, LATICACIDVEN in the last 168 hours.  No results found for this or any previous visit (from the past 240 hour(s)).       Radiology Studies: Dg Chest 2 View  Result Date: 06/12/2017 CLINICAL DATA:  Pneumonia short of breath EXAM: CHEST  2 VIEW COMPARISON:  06/11/2017 FINDINGS: Bibasilar airspace disease mildly improved. Pulmonary vascular congestion mildly improved. Small bilateral effusions unchanged. Pacemaker  unchanged. Cardiac enlargement. IMPRESSION: Mild improvement in vascular congestion. Mild improvement in bibasilar airspace disease. Small bilateral effusions unchanged. Electronically Signed   By: Franchot Gallo M.D.   On: 06/12/2017 08:19   Dg Chest 2 View  Result Date: 06/11/2017 CLINICAL DATA:  Shortness of breath, hypoxemia. EXAM: CHEST  2 VIEW COMPARISON:  Radiographs of August 04, 2015. FINDINGS: Stable cardiomegaly. No pneumothorax is noted. Single lead left-sided pacemaker is unchanged in position. Bilateral basilar opacities are noted, left greater than right, concerning for edema or pneumonia. Moderate left pleural effusion is noted as well. Bony thorax is unremarkable. IMPRESSION: Bilateral basilar edema or pneumonia is noted,, left greater than right. Moderate left pleural effusion is noted. Electronically Signed   By: Marijo Conception, M.D.   On: 06/11/2017 10:17   Ct Head Wo Contrast  Result Date: 06/11/2017 CLINICAL DATA:  Status post fall.  Increased lethargy. EXAM: CT HEAD WITHOUT CONTRAST TECHNIQUE: Contiguous axial images were obtained from the base of the  skull through the vertex without intravenous contrast. COMPARISON:  08/28/2012 FINDINGS: Brain: No evidence of acute infarction, hemorrhage, extra-axial collection, ventriculomegaly, or mass effect. Generalized cerebral atrophy. Periventricular white matter low attenuation likely secondary to microangiopathy. Vascular: Cerebrovascular atherosclerotic calcifications are noted. Skull: Negative for fracture or focal lesion. Sinuses/Orbits: Visualized portions of the orbits are unremarkable. Visualized portions of the paranasal sinuses and mastoid air cells are unremarkable. Other: None. IMPRESSION: 1. No acute intracranial pathology. Electronically Signed   By: Kathreen Devoid   On: 06/11/2017 11:18   Dg Hip Unilat With Pelvis 2-3 Views Left  Result Date: 06/11/2017 CLINICAL DATA:  Left hip pain after a mechanical fall while walking this morning. The patient fell onto the left hip. EXAM: DG HIP (WITH OR WITHOUT PELVIS) 2-3V LEFT COMPARISON:  AP pelvis and right hip series of August 01, 2012 FINDINGS: The bones are subjectively osteopenic. There is a prosthetic left hip joint. Radiographic positioning of the prosthetic components is good. The interface with the native bone appears normal. No acute native bone abnormality is observed. IMPRESSION: There is no acute abnormality of the prosthetic left hip joint nor of the native bone. Electronically Signed   By: David  Martinique M.D.   On: 06/11/2017 09:24        Scheduled Meds: . ALPRAZolam  0.25 mg Oral QHS  . amLODipine  5 mg Oral Daily  . apixaban  5 mg Oral BID  . buPROPion  75 mg Oral Daily  . calcium-vitamin D  1 tablet Oral BID  . cholecalciferol  2,000 Units Oral Daily  . famotidine  10 mg Oral Daily  . furosemide  40 mg Oral Daily  . levothyroxine  75 mcg Oral QAC breakfast  . losartan  100 mg Oral Daily  . mouth rinse  15 mL Mouth Rinse BID  . Melatonin  9 mg Oral QHS  . memantine  28 mg Oral QHS  . metoprolol tartrate  25 mg Oral BID  .  multivitamin with minerals  1 tablet Oral Daily  . potassium chloride  20 mEq Oral Daily  . rivastigmine  9.5 mg Transdermal QHS  . traZODone  50 mg Oral QHS   Continuous Infusions: . levofloxacin (LEVAQUIN) IV Stopped (06/11/17 1558)     LOS: 1 day     Georgette Shell, MD [facility]  If 7PM-7AM, please contact night-coverage [email or web address] Password [email or web address] 06/12/2017, 9:52 AM

## 2017-06-12 NOTE — NC FL2 (Signed)
[place] LEVEL OF CARE SCREENING TOOL     IDENTIFICATION  Patient Name: [name] Birthdate: 08-01-1932 Sex: female Admission Date (Current Location): 06/11/2017  [facility] and [place] Number:  Herbalist and Address:  [facility] [place], [place] [address], [place],  [address]      Provider Number: [id number]  Attending Physician Name and Address:  Georgette Shell, MD  Relative Name and Phone Number:  Jenny Reichmann, son, [phone]    Current Level of Care: Hospital Recommended Level of Care: [place] Prior Approval Number:    Date Approved/Denied:   PASRR Number: [id number]  Discharge Plan: SNF    Current Diagnoses: Patient Active Problem List   Diagnosis Date Noted  . CAP (community acquired pneumonia) 06/11/2017  . Acute respiratory failure with hypoxia ([place]) 06/11/2017  . Anemia 06/11/2017  . 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 Bradycardia 07/29/2015  . Atrial fibrillation with slow ventricular response ([place])   . Second degree AV block   . Diastolic CHF, chronic ([place])   . Chronic anticoagulation   . PCO (posterior capsular opacification), right 12/22/2013  . Pseudophakia 12/22/2013  . Atrial fibrillation ([place])   . Hypertensive heart disease   . Mitral regurgitation   . Dementia 08/01/2012  . Nuclear cataract 08/14/2011    Orientation RESPIRATION BLADDER Height & Weight     (Disoriented x4)  O2(Nasal cannula 2L) Continent Weight: 68 kg (150 lb) Height:  5\' 7"  (170.2 cm)  BEHAVIORAL SYMPTOMS/MOOD NEUROLOGICAL BOWEL NUTRITION STATUS  Other (Comment)(Tries to get out of bed at night by herself)   Continent Diet(Please see DC Summary)  AMBULATORY STATUS COMMUNICATION OF NEEDS Skin   Extensive Assist Verbally Normal                       Personal Care Assistance Level of Assistance  Bathing, Feeding, Dressing Bathing Assistance: Independent Feeding assistance: Limited  assistance Dressing Assistance: Limited assistance     Functional Limitations Info             SPECIAL CARE FACTORS FREQUENCY  PT (By licensed PT), OT (By licensed OT)     PT Frequency: 5x/week OT Frequency: 3x/week            Contractures      Additional Factors Info  Code Status, Allergies, Psychotropic Code Status Info: DNR Allergies Info: Dilaudid Hydromorphone Hcl, Vicodin Hydrocodone-acetaminophen Psychotropic Info: Xanax, Wellbutrin, Trazadone         Current Medications (06/12/2017):  This is the current hospital active medication list Current Facility-Administered Medications  Medication Dose Route Frequency Provider Last Rate Last Dose  . albuterol (PROVENTIL) (2.5 MG/3ML) 0.083% nebulizer solution 2.5 mg  2.5 mg Nebulization Q4H PRN Rondel Jumbo, PA-C      . ALPRAZolam Duanne Moron) tablet 0.125 mg  0.125 mg Oral TID PRN Georgette Shell, MD   0.125 mg at 06/12/17 1644  . ALPRAZolam Duanne Moron) tablet 0.25 mg  0.25 mg Oral QHS Georgette Shell, MD      . amLODipine [facility]) tablet 5 mg  5 mg Oral Daily Rondel Jumbo, PA-C   5 mg at 06/12/17 0934  . apixaban (ELIQUIS) tablet 5 mg  5 mg Oral BID Rondel Jumbo, PA-C   5 mg at 06/12/17 0935  . bisacodyl (DULCOLAX) EC tablet 10 mg  10 mg Oral Daily PRN Georgette Shell, MD      . buPROPion [facility]) tablet  75 mg  75 mg Oral Daily Rondel Jumbo, PA-C   75 mg at 06/12/17 0935  . calcium-vitamin D (OSCAL WITH D) 500-200 MG-UNIT per tablet 1 tablet  1 tablet Oral BID Roxan Hockey, MD   1 tablet at 06/12/17 0934  . cholecalciferol (VITAMIN D) tablet 2,000 Units  2,000 Units Oral Daily Rondel Jumbo, PA-C   2,000 Units at 06/12/17 9629  . famotidine (PEPCID) tablet 10 mg  10 mg Oral Daily Rondel Jumbo, PA-C   10 mg at 06/12/17 5284  . furosemide (LASIX) tablet 40 mg  40 mg Oral Daily Rondel Jumbo, PA-C   40 mg at 06/12/17 0934  . guaiFENesin (MUCINEX) 12 hr tablet 600 mg  600 mg Oral BID PRN  Rondel Jumbo, PA-C      . levofloxacin (LEVAQUIN) IVPB 750 mg  750 mg Intravenous Q48H Rumbarger, Valeda Malm, RPH   Stopped at 06/11/17 1558  . levothyroxine (SYNTHROID, LEVOTHROID) tablet 75 mcg  75 mcg Oral QAC breakfast Rondel Jumbo, PA-C   75 mcg at 06/12/17 0935  . losartan (COZAAR) tablet 100 mg  100 mg Oral Daily Rondel Jumbo, PA-C   100 mg at 06/12/17 1324  . MEDLINE mouth rinse  15 mL Mouth Rinse BID Emokpae, Courage, MD   15 mL at 06/12/17 1608  . Melatonin TABS 9 mg  9 mg Oral QHS Emokpae, Courage, MD   9 mg at 06/11/17 2211  . memantine (NAMENDA XR) 24 hr capsule 28 mg  28 mg Oral QHS Rondel Jumbo, PA-C   28 mg at 06/11/17 2211  . metoprolol tartrate (LOPRESSOR) tablet 25 mg  25 mg Oral BID Rondel Jumbo, PA-C   25 mg at 06/12/17 0934  . multivitamin with minerals tablet 1 tablet  1 tablet Oral Daily Rondel Jumbo, PA-C   1 tablet at 06/12/17 4010  . polyethylene glycol (MIRALAX / GLYCOLAX) packet 17 g  17 g Oral Daily Georgette Shell, MD   17 g at 06/12/17 1156  . potassium chloride SA (K-DUR,KLOR-CON) CR tablet 20 mEq  20 mEq Oral Daily Georgette Shell, MD   20 mEq at 06/12/17 1156  . rivastigmine (EXELON) 9.5 mg/24hr 9.5 mg  9.5 mg Transdermal QHS Emokpae, Courage, MD   9.5 mg at 06/11/17 2211  . traZODone (DESYREL) tablet 50 mg  50 mg Oral QHS Georgette Shell, MD         Discharge Medications: Please see discharge summary for a list of discharge medications.  Relevant Imaging Results:  Relevant Lab Results:   Additional Information SSN: [place], [place]

## 2017-06-12 NOTE — Clinical Social Work Note (Signed)
Clinical Social Work Assessment  Patient Details  Name: [name] MRN: [id number] Date of Birth: [birth year]  Date of referral:  06/12/17               Reason for consult:  Facility Placement                Permission sought to share information with:  Facility Sport and exercise psychologist, Family Supports Permission granted to share information::  No  Name::     Risk manager::  SNFs  Relationship::  Son  Contact Information:  [phone]  Housing/Transportation Living arrangements for the past 2 months:  Single Family Home Source of Information:  Adult Children Patient Interpreter Needed:  None Criminal Activity/Legal Involvement Pertinent to Current Situation/Hospitalization:  No - Comment as needed Significant Relationships:  Adult Children Lives with:  Other (Comment)(Caregivers) Do you feel safe going back to the place where you live?  No Need for family participation in patient care:  Yes (Comment)  Care giving concerns:  CSW received consult for possible SNF placement at time of discharge. CSW spoke with patient's son, Jenny Reichmann, regarding PT recommendation of SNF placement at time of discharge. Patient's son reported that patient currently has caregivers around the clock with patient at home, but he is not sure they can handle patient at this level. He is also concerned about long term care planning and the differences between SNF and memory care. Patient's son expressed understanding of PT recommendation and is agreeable to SNF placement at time of discharge. CSW to continue to follow and assist with discharge planning needs.   Social Worker assessment / plan:  CSW spoke with patient's son concerning possibility of rehab at [facility] before returning home.  Employment status:  Retired Forensic scientist:  Medicaid In [place] PT Recommendations:  [place] / Referral to community resources:  [place]  Patient/Family's Response to care:  Patient's son expresses understanding of SNF recommendation and believes that is the best option for patient at this time. Patient went to [facility] four years ago but has progressed with her dementia since then. Patient's son requested CSW meet with him and his wife tomorrow morning for further questions.   Patient/Family's Understanding of and Emotional Response to Diagnosis, Current Treatment, and Prognosis:  Patient/family is realistic regarding therapy needs and expressed being hopeful for SNF placement. Patient's son expressed understanding of CSW role and discharge process as well as medical condition. No questions/concerns about plan or treatment.    Emotional Assessment Appearance:  Appears stated age Attitude/Demeanor/Rapport:  Unable to Assess Affect (typically observed):  Unable to Assess Orientation:  (Disoriented) Alcohol / Substance use:  Not Applicable Psych involvement (Current and /or in the community):  No (Comment)  Discharge Needs  Concerns to be addressed:  Care Coordination Readmission within the last 30 days:  No Current discharge risk:  Cognitively Impaired Barriers to Discharge:  Continued Medical Work up   [employer], Latanya Presser 06/12/2017, 4:37 PM

## 2017-06-12 NOTE — Progress Notes (Signed)
OT Cancellation Note  Patient Details Name: [name] MRN: [id number] DOB: 1932-09-20   Cancelled Treatment:    Reason Eval/Treat Not Completed: Patient at procedure or test/ unavailable. Pt off the floor, no family in room, will re-attempt tomorrow.   [place] 06/12/2017, 4:03 PM  Hulda Humphrey OTR/L [phone]

## 2017-06-12 NOTE — Evaluation (Addendum)
Clinical/Bedside Swallow Evaluation Patient Details  Name: [name] MRN: [id number] Date of Birth: June 21, 1933  Today's Date: 06/12/2017 Time: SLP Start Time (ACUTE ONLY): 1015 SLP Stop Time (ACUTE ONLY): 1110 SLP Time Calculation (min) (ACUTE ONLY): 55 min  Past Medical History:  Past Medical History:  Diagnosis Date  . 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 Acute congestive heart failure ([place]) 04/19/2014  . Anemia 06/11/2017  . Arthritis   . Atrial fibrillation ([place])   . Atrial fibrillation with slow ventricular response ([place])   . Bradycardia 07/29/2015  . CAP (community acquired pneumonia) 06/11/2017  . Chronic anticoagulation   . Dementia    mild  . Diastolic CHF, chronic ([place])   . Heart murmur   . Hypertension   . Hypertensive heart disease   . Mitral regurgitation   . Nuclear cataract 08/14/2011  . PCO (posterior capsular opacification), right 12/22/2013  . Presence of permanent cardiac pacemaker   . Pseudophakia 12/22/2013  . Second degree AV block    Past Surgical History:  Past Surgical History:  Procedure Laterality Date  . COLONOSCOPY  2002   Remote history of polypectomy  . EP IMPLANTABLE DEVICE N/A 08/03/2015   Procedure: Pacemaker Implant;  Surgeon: Will Meredith Leeds, MD;  Location: [facility];  Service: Cardiovascular;  Laterality: N/A;  . TOTAL HIP ARTHROPLASTY Bilateral    Left in 2007, right in 2005   HPI:  83 year old female admitted 06/11/17 with left hip pain after a fall at home. PMH significant for dementia, Afib, diastolic heart failure. CT - negative for acute findings, CXR bibasilar airspace disease, L>R.    Assessment / Plan / Recommendation Clinical Impression  Pt presents with adequate oral motor strength and function. Pt also exhibits significant cognitive impairment with minimal short term memory.   Family reports recent difficulty with pills at home, and has been a very slow eater for quite some time, with extended  oral prep observed. Pt presented with trials of thin liquid via cup and straw, nectar thick liquid, puree, and solid consistencies. Pt exhibited mild throat clear throughout evaluation, even prior to po presentation. Family and caregiver indicated this is baseline behavior, and raises suspicion of esophageal issues. Pt has a history of GERD, and reports globus sensation as well.  Pt appeared to tolerate thin via cup sip, nectar thick liquid, puree, and graham cracker trials without overt difficulty, but continued to exhibit intermittent throat clear and indication of globus sensation.   Will downgrade diet to dys 2 (finely chopped) with extra gravy/sauce for moisture, thin liquids via cup (no straws), and crushed meds. Also recommend consideration of regular barium swallow to objectively assess esophageal motility. ST will follow acutely to assess diet tolerance and provide continued education. RN and MD informed of results and recommendations.   SLP Visit Diagnosis: Dysphagia, unspecified (R13.10)    Aspiration Risk  Mild aspiration risk    Diet Recommendation Dysphagia 2 (Fine chop);Thin liquid - no straws  Liquid Administration via: Cup;No straw Medication Administration: Crushed with puree Compensations: Minimize environmental distractions;Slow rate;Small sips/bites;Follow solids with liquid(begin meal with warm beverage/broth) Postural Changes: Seated upright at 90 degrees;Remain upright for at least 30 minutes after po intake    Other  Recommendations Recommended Consults: Consider esophageal assessment Oral Care Recommendations: Oral care QID Other Recommendations: Other (Comment)(smaller more frequent meals)   Follow up Recommendations 24 hour supervision/assistance      Frequency and Duration min 1 x/week  1 week;2 weeks  Prognosis Prognosis for Safe Diet Advancement: Fair Barriers to Reach Goals: Cognitive deficits      Swallow Study   General Date of Onset:  06/11/17 HPI: 83 year old female admitted 06/11/17 with left hip pain after a fall at home. PMH significant for dementia, Afib, diastolic heart failure. CT - negative for acute findings, CXR bibasilar airspace disease, L>R.  Type of Study: Bedside Swallow Evaluation Previous Swallow Assessment: none Diet Prior to this Study: Dysphagia 3 (soft);Thin liquids Temperature Spikes Noted: No Respiratory Status: Nasal cannula History of Recent Intubation: No Behavior/Cognition: Alert;Pleasant mood;Confused;Cooperative;Distractible;Requires cueing Oral Cavity Assessment: Within Functional Limits Oral Care Completed by SLP: No Oral Cavity - Dentition: Adequate natural dentition Vision: Functional for self-feeding Self-Feeding Abilities: Able to feed self Patient Positioning: Upright in chair Baseline Vocal Quality: Normal Volitional Cough: Strong Volitional Swallow: Able to elicit    Oral/Motor/Sensory Function Overall Oral Motor/Sensory Function: Within functional limits   Ice Chips Ice chips: Within functional limits Presentation: Spoon   Thin Liquid Thin Liquid: Impaired Presentation: Cup;Self Fed;Straw Pharyngeal  Phase Impairments: Cough - Immediate(with straw use. No cough response with cup sips)    Nectar Thick Nectar Thick Liquid: Within functional limits Presentation: Cup   Honey Thick Honey Thick Liquid: Not tested   Puree Puree: Within functional limits Presentation: Self Fed;Spoon   Solid   GO   Solid: Within functional limits Presentation: Kalona B. Quentin Ore, [facility], [place] Speech Language Pathologist [phone]  Shonna Chock 06/12/2017,11:20 AM

## 2017-06-12 NOTE — Progress Notes (Signed)
Pt stating she has some trouble breathing. Pt with O2 off when in room and sating 90%, pt placed back on 2L via [place] and now sating 94-95%. Pt noted to have new crackles in the left base. MD made aware. Orders to stop IVF and give lasix 40mg  po x1. Pt to be medicated. WCTM

## 2017-06-12 NOTE — Plan of Care (Signed)
  Progressing Activity: Risk for activity intolerance will decrease 06/12/2017 1324 - Progressing by Marice Potter, RN Nutrition: Adequate nutrition will be maintained 06/12/2017 1324 - Progressing by Marice Potter, RN Coping: Level of anxiety will decrease 06/12/2017 1324 - Progressing by Marice Potter, RN

## 2017-06-12 NOTE — Evaluation (Signed)
Physical Therapy Evaluation Patient Details Name: [name] MRN: [id number] DOB: December 25, 1932 Today's Date: 06/12/2017   History of Present Illness  Pt is a 83 y.o. female with history of Alzheimer's dementia, living at home with around-the-clock care, atrial fibrillation on Eliquis, diastolic heart failure, presenting to the ED with 2-3-day history of increased drowsiness, [place] Pt had a unwitnessed fall, landing on her left side. Dx acute respiratory failure with hypoxia secondary to aspiration pneumonia  Clinical Impression  Pt admitted with above diagnosis. Pt currently with functional limitations due to the deficits listed below (see PT Problem List). Pt is limited by weakness and decreased balance. Current mobility is min guard for bed mobility, minA for transfers and modA for ambulation of 12 feet. Discussed pt current level of mobility combined with pt dementia would require hands on assist with any mobility for safety as well as making sure that all furniture is secured so that when she reaches for it to steady herself it does not move. PT currently recommending SNF placement to increase strength and stability with gait before going home.        Follow Up Recommendations SNF    Equipment Recommendations  None recommended by PT       Precautions / Restrictions Precautions Precautions: Fall Restrictions Weight Bearing Restrictions: No      Mobility  Bed Mobility Overal bed mobility: Needs Assistance Bed Mobility: Supine to Sit     Supine to sit: Min guard     General bed mobility comments: min guard for safety  Transfers Overall transfer level: Needs assistance Equipment used: 1 person hand held assist Transfers: Sit to/from Stand Sit to Stand: Min assist         General transfer comment: minA for power up and steadying in standing, utilized LE to push into bed to gain stability  Ambulation/Gait Ambulation/Gait assistance: Mod assist Ambulation  Distance (Feet): 12 Feet Assistive device: 1 person hand held assist Gait Pattern/deviations: Step-through pattern;Decreased step length - right;Decreased step length - left;Shuffle;Antalgic;Drifts right/left     General Gait Details: modA for steadying with gait belt, L hand under L hand to provide leverage with movement, pt reached out with R UE to furniture in room, Stopped twice and said she couldn't walk any more and then with vc for stepping started again. 2x LoB requiring assist to recover, no knee buckling,      Balance Overall balance assessment: Needs assistance Sitting-balance support: Feet supported Sitting balance-Leahy Scale: Fair     Standing balance support: During functional activity;Single extremity supported Standing balance-Leahy Scale: Poor                               Pertinent Vitals/Pain Pain Assessment: Faces Faces Pain Scale: No hurt    Home Living Family/patient expects to be discharged to:: Private residence Living Arrangements: Other (Comment)(24 hour in home care) Available Help at Discharge: Available 24 hours/day;Personal care attendant Type of Home: House Home Access: Stairs to enter   Entrance Stairs-Number of Steps: 4 Home Layout: One level Home Equipment: None      Prior Function Level of Independence: Needs assistance   Gait / Transfers Assistance Needed: independent with gait without AD, noted decrease in speed of movement   ADL's / Homemaking Assistance Needed: independent in bathing and grooming with supplies set out, homemaking done by aide  Comments: attends adult day care during week and participates in chair exercise  Hand Dominance   Dominant Hand: Right    Extremity/Trunk Assessment   Upper Extremity Assessment Upper Extremity Assessment: Defer to OT evaluation    Lower Extremity Assessment Lower Extremity Assessment: Generalized weakness;Difficult to assess due to impaired cognition        Communication   Communication: Other (comment)(dementia constant cuing required)  Cognition Arousal/Alertness: Awake/alert Behavior During Therapy: WFL for tasks assessed/performed Overall Cognitive Status: History of cognitive impairments - at baseline                                 General Comments: requires constant single step cuing      General Comments General comments (skin integrity, edema, etc.): 2L O2 via nasal cannula throughout session maintained O2 >95%, Son and caregiver present through out session     Assessment/Plan    PT Assessment Patient needs continued PT services  PT Problem List Decreased balance;Decreased cognition;Decreased safety awareness;Decreased knowledge of use of DME;Decreased knowledge of precautions;Cardiopulmonary status limiting activity       PT Treatment Interventions Gait training;Functional mobility training;Therapeutic activities;Therapeutic exercise;Balance training;Cognitive remediation;Patient/family education    PT Goals (Current goals can be found in the Care Plan section)  Acute Rehab PT Goals Patient Stated Goal: none stated PT Goal Formulation: With family Time For Goal Achievement: 06/26/17 Potential to Achieve Goals: Fair    Frequency Min 2X/week       AM-PAC PT "6 Clicks" Daily Activity  Outcome Measure Difficulty turning over in bed (including adjusting bedclothes, sheets and blankets)?: A Little Difficulty moving from lying on back to sitting on the side of the bed? : A Lot Difficulty sitting down on and standing up from a chair with arms (e.g., wheelchair, bedside commode, etc,.)?: Unable Help needed moving to and from a bed to chair (including a wheelchair)?: A Lot Help needed walking in hospital room?: A Lot Help needed climbing 3-5 steps with a railing? : Total 6 Click Score: 11    End of Session Equipment Utilized During Treatment: Gait belt Activity Tolerance: Patient tolerated treatment  well Patient left: Other (comment)(left on [facility] nurse tech in room)   PT Visit Diagnosis: Unsteadiness on feet (R26.81);Other abnormalities of gait and mobility (R26.89);Repeated falls (R29.6);Muscle weakness (generalized) (M62.81);Difficulty in walking, not elsewhere classified (R26.2);Other symptoms and signs involving the nervous system (R29.898)    Time: 6578-4696 PT Time Calculation (min) (ACUTE ONLY): 31 min   Charges:   PT Evaluation $PT Eval Moderate Complexity: 1 Mod PT Treatments $Gait Training: 8-22 mins   PT G Codes:        Khoi Hamberger B. Migdalia Dk PT, DPT Acute Rehabilitation  [phone] Pager [phone]    [place] 06/12/2017, 10:53 AM

## 2017-06-13 ENCOUNTER — Telehealth: Payer: Self-pay | Admitting: Cardiology

## 2017-06-13 DIAGNOSIS — F458 Other somatoform disorders: Secondary | ICD-10-CM

## 2017-06-13 LAB — BASIC METABOLIC PANEL
ANION GAP: 9 (ref 5–15)
BUN: 10 mg/dL (ref 6–20)
CHLORIDE: 100 mmol/L — AB (ref 101–111)
CO2: 25 mmol/L (ref 22–32)
Calcium: 9.3 mg/dL (ref 8.9–10.3)
Creatinine, Ser: 0.82 mg/dL (ref 0.44–1.00)
GFR calc Af Amer: >60 mL/min (ref >60)
GLUCOSE: 116 mg/dL — AB (ref 65–99)
POTASSIUM: 3.2 mmol/L — AB (ref 3.5–5.1)
Sodium: 134 mmol/L — ABNORMAL LOW (ref 135–145)

## 2017-06-13 NOTE — Progress Notes (Signed)
  Speech Language Pathology Treatment: Dysphagia  Patient Details Name: [name] MRN: [id number] DOB: Apr 24, 1933 Today's Date: 06/13/2017 Time: 0905-0930 SLP Time Calculation (min) (ACUTE ONLY): 25 min  Assessment / Plan / Recommendation Clinical Impression  Pt seen for assessment of diet tolerance and education with family and caregiver. Pt underwent BSE yesterday, followed by Barium Swallow. BaSw revealed NO ASPIRATION, but a prominent CP. Exercises were reviewed and provided in written form to pt's son and caregiver, including Shaker, CTAR, and [place]. Unfortunately, given pt's advanced dementia, the Caryl Ada may not be feasible for pt to execute, however, Shaker and CTAR may be beneficial to facilitate proper CP function. Pt was observed drinking thin liquids via cup sip, and exhibited no overt s/s aspiration. Safe swallow precautions posted at [facility], and were reviewed with pt's son, caregiver, and RN. ST will continue to follow acutely for assessment of diet tolerance, education, HEP, and to determine if advanced diet is appropriate.     HPI HPI: 83 year old female admitted 06/11/17 with left hip pain after a fall at home. PMH significant for dementia, Afib, diastolic heart failure. CT - negative for acute findings, CXR bibasilar airspace disease, L>R.       SLP Plan  Continue with current plan of care       Recommendations  Diet recommendations: Dysphagia 2 (fine chop);Thin liquid Liquids provided via: Cup;No straw Medication Administration: Crushed with puree Supervision: Patient able to self feed;Full supervision/cueing for compensatory strategies Compensations: Minimize environmental distractions;Slow rate;Small sips/bites;Follow solids with liquid(begin meal with warm liquid) Postural Changes and/or Swallow Maneuvers: Out of bed for meals;Seated upright 90 degrees;Upright 30-60 min after meal                Oral Care Recommendations: Oral care QID Follow up  Recommendations: 24 hour supervision/assistance SLP Visit Diagnosis: Dysphagia, unspecified (R13.10) Plan: Continue with current plan of care       GO              Breken Nazari B. Quentin Ore [facility], CCC-SLP Speech Language Pathologist [phone]  Shonna Chock 06/13/2017, 10:13 AM

## 2017-06-13 NOTE — Telephone Encounter (Signed)
Forwarded to Dr Curt Bears also

## 2017-06-13 NOTE — Progress Notes (Signed)
Exit care handouts given on CAP, Levaquin, and pleural effusion.

## 2017-06-13 NOTE — Progress Notes (Signed)
PROGRESS NOTE    [name]  ZOX:[id number] DOB: 05-18-1933 DOA: 06/11/2017 PCP: Lajean Manes, MD    Brief Narrative: 83 y.o.femalewithhistory of Alzheimer's dementia, living at home with around-the-clock care, atrial fibrillation on Eliquis, diastolic heart failure, presenting to the ED with 2-3-day history of increased drowsiness, especially over the last day. History is obtained by her son who is at bedside,as the patient is a level 5 caveat due to lethargy and dementia. She had increased fatigue, and today, she had a unwitnessed fall, landing on her left side. Patient was not complaining of pain in the left hip, or have any bleeding issues. Family denies being told about any fevers, chills, night sweats, nausea, vomiting or diarrhea. She was urinating well, without expressing any pain in the lower abdomen, or having hematuria. The only new medication is Wellbutrin, started 3 days ago. The rest of the medications, she takes on a regular basis, and is compliant to them. Is not on any antibiotics, and she may have had a sick contact at the recreational center that she attends. Family did not notice any unilateral weakness, or the patient having any trouble swallowing, or vision changes. No syncope or presyncope. He did not have any other falls prior to this event.     Assessment & Plan:   Active Problems:   Dementia   Atrial fibrillation (HCC)   Hypertensive heart disease   Mitral regurgitation   Chronic anticoagulation   Diastolic CHF, chronic (HCC)   CAP (community acquired pneumonia)   Acute respiratory failure with hypoxia (HCC)   Anemia  1] acute respiratory failure with hypoxia secondary to fluid overload pleural effusion/possible atypicalpneumonia-patient receives Lasix 40 mg daily at home she has a history of congestive heart failure.  She is also put on antibiotics at this time for possible pneumonia.  Will monitor closely and adjust the dose and titrate  the dose of antibiotics as needed pharmacy following.  Follow-up sputum culture.  Continue Mucinex and nebulizers.  DC IV fluids.add kdur.  Chest x-ray shows improvement in pulmonary edema.patient is on levofloxacinq48 hrs.she had esophagogram showed mild stricture at of eso at c6 level.no aspiration. 2]hyponatremia-chronicimproved.avoid hypotonic fluids.  3}htn continue norvsc,cozaar.  4] hypothyroidism continue Synthroid    DVT prophylaxis apixaban Family Communication: dw son  Disposition Plan: tbd Consultants:  Pt/ot Procedures: none Antimicrobials: levoquin  Subjective:feels better.walked with ot.breathing better.   Objective:sitting in the chair on oxygen at 2 lit.did not have o2 pta. Vitals:   06/12/17 2210 06/12/17 2212 06/13/17 0648 06/13/17 0913  BP: (!) 72/51 116/64 (!) 117/59 (!) 143/59  Pulse: 72 70 (!) 59 84  Resp: 18  18   Temp: 97.7 F (36.5 C)  97.6 F (36.4 C)   TempSrc: Oral  Oral   SpO2: 92%  96%   Weight:      Height:        Intake/Output Summary (Last 24 hours) at 06/13/2017 1338 Last data filed at 06/13/2017 1014 Gross per 24 hour  Intake 240 ml  Output -  Net 240 ml   Filed Weights   06/11/17 1200 06/12/17 0656  Weight: 68 kg (149 lb 14.6 oz) 68 kg (150 lb)    Examination:  General exam: Appears calm and comfortable  Respiratory systemleft base crackles auscultation. Respiratory effort normal. Cardiovascular system: S1 & S2 heard, RRR. No JVD, murmurs, rubs, gallops or clicks. No pedal edema. Gastrointestinal system: Abdomen is nondistended, soft and nontender. No organomegaly or masses felt. Normal bowel sounds  heard. Central nervous system: Alert and oriented. No focal neurological deficits. Extremities: Symmetric 5 x 5 power.no edema Skin: No rashes, lesions or ulcers Psychiatry: Judgement and insight appear normal. Mood & affect appropriate.     Data Reviewed: I have personally reviewed following labs and imaging  studies  CBC: Recent Labs  Lab 06/11/17 0835 06/12/17 0516  WBC 7.4 8.7  HGB 10.9* 10.9*  HCT 33.4* 33.3*  MCV 91.3 91.5  PLT 261 254   Basic Metabolic Panel: Recent Labs  Lab 06/11/17 0835 06/11/17 1700 06/12/17 0516 06/13/17 0545  NA 130* 130* 129* 134*  K 3.6 3.7 3.8 3.2*  CL 94* 98* 97* 100*  CO2 24 22 19* 25  GLUCOSE 103* 116* 132* 116*  BUN 14 11 10 10   CREATININE 0.89 0.73 0.80 0.82  CALCIUM 9.0 8.6* 8.7* 9.3   GFR: Estimated Creatinine Clearance: 51.4 mL/min (by C-G formula based on SCr of 0.82 mg/dL). Liver Function Tests: Recent Labs  Lab 06/11/17 0835 06/12/17 0516  AST 26 30  ALT 15 16  ALKPHOS 59 51  BILITOT 1.1 1.2  PROT 6.7 6.2*  ALBUMIN 3.4* 3.2*   No results for input(s): LIPASE, AMYLASE in the last 168 hours. No results for input(s): AMMONIA in the last 168 hours. Coagulation Profile: No results for input(s): INR, PROTIME in the last 168 hours. Cardiac Enzymes: No results for input(s): CKTOTAL, CKMB, CKMBINDEX, TROPONINI in the last 168 hours. BNP (last 3 results) No results for input(s): PROBNP in the last 8760 hours. HbA1C: No results for input(s): HGBA1C in the last 72 hours. CBG: No results for input(s): GLUCAP in the last 168 hours. Lipid Profile: No results for input(s): CHOL, HDL, LDLCALC, TRIG, CHOLHDL, LDLDIRECT in the last 72 hours. Thyroid Function Tests: Recent Labs    06/11/17 1741  TSH 6.830*   Anemia Panel: Recent Labs    06/11/17 1700  VITAMINB12 589  FOLATE 42.0  FERRITIN 78  TIBC 398  IRON 67  RETICCTPCT 2.7   Sepsis Labs: No results for input(s): PROCALCITON, LATICACIDVEN in the last 168 hours.  Recent Results (from the past 240 hour(s))  Urine culture     Status: None   Collection Time: 06/11/17 10:21 AM  Result Value Ref Range Status   Specimen Description URINE, CLEAN CATCH  Final   Special Requests Normal  Final   Culture NO GROWTH  Final   Report Status 06/12/2017 FINAL  Final          Radiology Studies: Dg Chest 2 View  Result Date: 06/12/2017 CLINICAL DATA:  Pneumonia short of breath EXAM: CHEST  2 VIEW COMPARISON:  06/11/2017 FINDINGS: Bibasilar airspace disease mildly improved. Pulmonary vascular congestion mildly improved. Small bilateral effusions unchanged. Pacemaker unchanged. Cardiac enlargement. IMPRESSION: Mild improvement in vascular congestion. Mild improvement in bibasilar airspace disease. Small bilateral effusions unchanged. Electronically Signed   By: Franchot Gallo M.D.   On: 06/12/2017 08:19   Dg Esophagus  Result Date: 06/12/2017 CLINICAL DATA:  Globus sensation. EXAM: ESOPHOGRAM/BARIUM SWALLOW TECHNIQUE: Single contrast examination was performed using  thin barium. FLUOROSCOPY TIME:  Fluoroscopy Time:  1 minutes 48 seconds Radiation Exposure Index (if provided by the fluoroscopic device): Number of Acquired Spot Images: 0 COMPARISON:  None. FINDINGS: The examination is limited by small sips and difficulty swallowing. Also positioning was challenging. Negative for aspiration. There is a stricture in the proximal esophagus at approximately the C6 level. This is difficult to image but appears to be a smooth posterior stricture,  likely prominent cricopharyngeus muscle. Remainder of the esophagus has normal mucosa without ulcer or mass or stricture. No hiatal hernia. Mild decreased peristalsis IMPRESSION: Benign-appearing stricture in the proximal esophagus at the C6 level, favor prominent cricopharyngeus muscle. Otherwise no significant abnormality. Electronically Signed   By: Franchot Gallo M.D.   On: 06/12/2017 15:51        Scheduled Meds: . ALPRAZolam  0.25 mg Oral QHS  . amLODipine  5 mg Oral Daily  . apixaban  5 mg Oral BID  . buPROPion  75 mg Oral Daily  . calcium-vitamin D  1 tablet Oral BID  . cholecalciferol  2,000 Units Oral Daily  . famotidine  10 mg Oral Daily  . furosemide  40 mg Oral Daily  . levothyroxine  75 mcg Oral QAC breakfast   . losartan  100 mg Oral Daily  . mouth rinse  15 mL Mouth Rinse BID  . Melatonin  9 mg Oral QHS  . memantine  28 mg Oral QHS  . metoprolol tartrate  25 mg Oral BID  . multivitamin with minerals  1 tablet Oral Daily  . polyethylene glycol  17 g Oral Daily  . potassium chloride  20 mEq Oral Daily  . rivastigmine  9.5 mg Transdermal QHS  . traZODone  50 mg Oral QHS   Continuous Infusions: . levofloxacin (LEVAQUIN) IV Stopped (06/11/17 1558)     LOS: 2 days      Georgette Shell, MD [facility]   If 7PM-7AM, please contact night-coverage [email or web address] Password [email or web address] 06/13/2017, 1:38 PM

## 2017-06-13 NOTE — Evaluation (Addendum)
Occupational Therapy Evaluation Patient Details Name: [name] MRN: [id number] DOB: 1932-08-24 Today's Date: 06/13/2017    History of Present Illness Pt is a 83 y.o. female with history of Alzheimer's dementia, living at home with around-the-clock care, atrial fibrillation on Eliquis, diastolic heart failure, presenting to the ED with 2-3-day history of increased drowsiness, [place] Pt had a unwitnessed fall, landing on her left side. Dx acute respiratory failure with hypoxia secondary to aspiration pneumonia   Clinical Impression   This 83 y/o F presents with the above. Pt receives 24 hr supervision/assist from hired caregivers at home who mostly assist with iADL needs, Pt was completing functional mobility and ADLs without assist. Pt currently requires Min-ModA for functional mobility within room, [place] for LB ADLs. Pt will benefit from SNF level OT services after discharge to progress Pt's safety and independence with ADLs and mobility prior to return home. If Pt's family chooses to return home (vs SNF) recommend [facility]. Will continue to follow acutely.   Pt completed room level functional mobility with and without supplemental O2, ambulating without O2 Pt's lowest sat reading 84% on RA after room level functional mobility approx 10-15' and transferring to toilet. Pt required approx 45 seconds-1 min and cues for deep breathing while sitting to increase O2 sats to 90% and above.       Follow Up Recommendations  SNF;Other (comment)(if family chooses for Pt to return home, recommend [facility] )    Equipment Recommendations  3 in 1 bedside commode           Precautions / Restrictions Precautions Precautions: Fall Restrictions Weight Bearing Restrictions: No      Mobility Bed Mobility               General bed mobility comments: OOB in recliner upon arrival   Transfers Overall transfer level: Needs assistance Equipment used: 1 person hand held assist Transfers:  Sit to/from Stand Sit to Stand: Min assist         General transfer comment: minA for power up and steadying in standing, ModA from lower level of toilet     Balance Overall balance assessment: Needs assistance Sitting-balance support: Feet supported Sitting balance-Leahy Scale: Fair     Standing balance support: During functional activity;Single extremity supported Standing balance-Leahy Scale: Poor                             ADL either performed or assessed with clinical judgement   ADL Overall ADL's : Needs assistance/impaired Eating/Feeding: Set up;Sitting   Grooming: Wash/dry hands;Set up;Sitting;Wash/dry face   Upper Body Bathing: Minimal assistance;Sitting   Lower Body Bathing: Moderate assistance;Sit to/from stand   Upper Body Dressing : Minimal assistance;Sitting   Lower Body Dressing: Moderate assistance;Sit to/from stand Lower Body Dressing Details (indicate cue type and reason): Pt able to utilize figure 4 technique to adjust socks seated in recliner  Toilet Transfer: Moderate assistance;Ambulation;Regular Toilet;Grab bars Toilet Transfer Details (indicate cue type and reason): increased asssist due to lower surface height and UE support only available on one side (grab bar) Toileting- Clothing Manipulation and Hygiene: Moderate assistance;Sit to/from stand Toileting - Clothing Manipulation Details (indicate cue type and reason): Pt able to perform clothing management of mesh underwear and pericare after voiding bladder, ModA in standing during clothing management and multimodal cues to initiate pulling up mesh underwear after task completion      Functional mobility during ADLs: Minimal assistance General ADL  Comments: Pt ambulates from recliner to chair in front of sink for grooming ADLs, returned to recliner, MD arriving and requesting mobility without supplemental O2, assisted Pt to bathroom for toileting and back to recliner, O2 monitored with  lowest sat 84% on RA after transfer to sitting on toilet, returned to 90-91% within approx 30 seconds-1 min and multimodal cues for deep breathing technique                          Pertinent Vitals/Pain Pain Assessment: No/denies pain     Hand Dominance Right   Extremity/Trunk Assessment Upper Extremity Assessment Upper Extremity Assessment: Generalized weakness   Lower Extremity Assessment Lower Extremity Assessment: Defer to PT evaluation       Communication Communication Communication: Other (comment)(dementia, constant cuing required )   Cognition Arousal/Alertness: Awake/alert Behavior During Therapy: WFL for tasks assessed/performed Overall Cognitive Status: History of cognitive impairments - at baseline                                 General Comments: requires constant single step cuing   General Comments                  Home Living Family/patient expects to be discharged to:: Private residence Living Arrangements: Other (Comment)(24 hr care in home ) Available Help at Discharge: Available 24 hours/day;Personal care attendant Type of Home: House Home Access: Stairs to enter [employer] of Steps: 4   Home Layout: One level     Bathroom Shower/Tub: Occupational psychologist: Standard     Home Equipment: None          Prior Functioning/Environment Level of Independence: Needs assistance  Gait / Transfers Assistance Needed: independent with gait without AD, noted decrease in speed of movement  ADL's / Homemaking Assistance Needed: independent in bathing, dressing and grooming with supplies set out, homemaking done by aide   Comments: attends adult day care during week and participates in chair exercise        OT Problem List: Decreased strength;Impaired balance (sitting and/or standing);Decreased cognition;Decreased activity tolerance;Decreased knowledge of use of DME or AE      OT  Treatment/Interventions: Self-care/ADL training;DME and/or AE instruction;Therapeutic activities;Balance training;Energy conservation;Patient/family education    OT Goals(Current goals can be found in the care plan section) Acute Rehab OT Goals Patient Stated Goal: none stated OT Goal Formulation: With patient Time For Goal Achievement: 06/27/17 Potential to Achieve Goals: Good  OT Frequency: Min 2X/week                             AM-PAC PT "6 Clicks" Daily Activity     Outcome Measure Help from another person eating meals?: A Little Help from another person taking care of personal grooming?: A Little Help from another person toileting, which includes using toliet, bedpan, or urinal?: A Little Help from another person bathing (including washing, rinsing, drying)?: A Lot Help from another person to put on and taking off regular upper body clothing?: A Little Help from another person to put on and taking off regular lower body clothing?: A Lot 6 Click Score: 16   End of Session Equipment Utilized During Treatment: Gait belt;Oxygen Nurse Communication: Mobility status;Other (comment)(O2 sats )  Activity Tolerance: Patient tolerated treatment well Patient left: in chair;with call bell/phone within reach;with family/visitor present;with  nursing/sitter in room  OT Visit Diagnosis: Unsteadiness on feet (R26.81);Muscle weakness (generalized) (M62.81)                Time: 7670-1100 OT Time Calculation (min): 42 min Charges:  OT General Charges $OT Visit: 1 Visit OT Evaluation $OT Eval Moderate Complexity: 1 Mod OT Treatments $Self Care/Home Management : 23-37 mins G-Codes:     Lou Cal, OT Pager [phone] 06/13/2017   Raymondo Band 06/13/2017, 12:38 PM

## 2017-06-13 NOTE — Telephone Encounter (Signed)
[name] aware of this message.

## 2017-06-13 NOTE — Plan of Care (Signed)
  Progressing SLP Dysphagia Goals Patient will utilize recommended strategies Description Patient will utilize recommended strategies during swallow to increase swallowing safety with 06/13/2017 1019 - Progressing by Colon Flattery B, CCC-SLP Misc Dysphagia Goal 06/13/2017 1019 - Progressing by Colon Flattery B, CCC-SLP

## 2017-06-13 NOTE — Telephone Encounter (Signed)
New Message     Son wants you to know that patient has been admitted to [facility] for pneumonia and she fell, he would like you to stop in and see her if you are making rounds at the hospital

## 2017-06-14 ENCOUNTER — Inpatient Hospital Stay (HOSPITAL_COMMUNITY): Payer: Medicare Other

## 2017-06-14 LAB — BASIC METABOLIC PANEL
ANION GAP: 9 (ref 5–15)
BUN: 13 mg/dL (ref 6–20)
CALCIUM: 9.4 mg/dL (ref 8.9–10.3)
CHLORIDE: 99 mmol/L — AB (ref 101–111)
CO2: 23 mmol/L (ref 22–32)
Creatinine, Ser: 0.79 mg/dL (ref 0.44–1.00)
GFR calc non Af Amer: >60 mL/min (ref >60)
GLUCOSE: 107 mg/dL — AB (ref 65–99)
POTASSIUM: 4.1 mmol/L (ref 3.5–5.1)
Sodium: 131 mmol/L — ABNORMAL LOW (ref 135–145)

## 2017-06-14 MED ORDER — FUROSEMIDE 40 MG PO TABS
40.0000 mg | ORAL_TABLET | Freq: Two times a day (BID) | ORAL | Status: DC
  Administered 2017-06-14: 40 mg via ORAL
  Filled 2017-06-14: qty 1

## 2017-06-14 MED ORDER — LOSARTAN POTASSIUM 50 MG PO TABS
25.0000 mg | ORAL_TABLET | Freq: Every day | ORAL | Status: DC
  Administered 2017-06-15: 25 mg via ORAL
  Filled 2017-06-14: qty 1

## 2017-06-14 MED ORDER — LEVOFLOXACIN IN D5W 750 MG/150ML IV SOLN
750.0000 mg | INTRAVENOUS | Status: DC
  Administered 2017-06-14 – 2017-06-15 (×2): 750 mg via INTRAVENOUS
  Filled 2017-06-14 (×2): qty 150

## 2017-06-14 NOTE — Progress Notes (Signed)
Pharmacy Antibiotic Note  [name] is a 83 y.o. female admitted on 06/11/2017 with pneumonia.  Pharmacy has been consulted for levaquin dosing. Pt is afebrile. No CBC ordered for today. SCr is WNL, ~CrCl 52-55 ml/min. Levaquin dose needs to be adjusted based on renal function.   Plan: Adjust Levaquin to 750mg  IV Q24H F/u renal fxn, C&S, clinical status  Height: 5\' 7"  (170.2 cm) Weight: 151 lb (68.5 kg) IBW/kg (Calculated) : 61.6  Temp (24hrs), Avg:97.7 F (36.5 C), Min:97.6 F (36.4 C), Max:97.7 F (36.5 C)  Recent Labs  Lab 06/11/17 0835 06/11/17 1700 06/12/17 0516 06/13/17 0545 06/14/17 0557  WBC 7.4  --  8.7  --   --   CREATININE 0.89 0.73 0.80 0.82 0.79    Estimated Creatinine Clearance: 52.7 mL/min (by C-G formula based on SCr of 0.79 mg/dL).    Allergies  Allergen Reactions  . Dilaudid [Hydromorphone Hcl] Other (See Comments)    hallucinations  . Vicodin [Hydrocodone-Acetaminophen] Other (See Comments)    hallucinations    Antimicrobials this admission: Levaquin 12/5>> CTX x 1 12/5  Dose adjustments this admission: Levaquin 750 IV Q48h>>Q24h  Microbiology results: Pending  Leroy Libman, PharmD Pharmacy Resident Pager: [phone]

## 2017-06-14 NOTE — Progress Notes (Signed)
PROGRESS NOTE    [name]  JEH:[id number] DOB: 18-Aug-1932 DOA: 06/11/2017 PCP: Lajean Manes, MD  Brief Narrative:84 y.o.femalewithhistory of Alzheimer's dementia, living at home with around-the-clock care, atrial fibrillation on Eliquis, diastolic heart failure, presenting to the ED with 2-3-day history of increased drowsiness, especially over the last day. History is obtained by her son who is at bedside,as the patient is a level 5 caveat due to lethargy and dementia. She had increased fatigue, and today, she had a unwitnessed fall, landing on her left side. Patient was not complaining of pain in the left hip, or have any bleeding issues. Family denies being told about any fevers, chills, night sweats, nausea, vomiting or diarrhea. She was urinating well, without expressing any pain in the lower abdomen, or having hematuria. The only new medication is Wellbutrin, started 3 days ago. The rest of the medications, she takes on a regular basis, and is compliant to them. Is not on any antibiotics, and she may have had a sick contact at the recreational center that she attends. Family did not notice any unilateral weakness, or the patient having any trouble swallowing, or vision changes. No syncope or presyncope. He did not have any other falls prior to this event.  12/8-family requesting her cardiologist to come by and see the patient.  I have left a message with the answering service.  They told me Dr. Curt Bears is her cardiologist and have left a message with the office.  patient is somewhat anxious this morning otherwise was breathing seems to be better.  Assessment & Plan:   Active Problems:   Dementia   Atrial fibrillation (HCC)   Hypertensive heart disease   Mitral regurgitation   Chronic anticoagulation   Diastolic CHF, chronic (HCC)   CAP (community acquired pneumonia)   Acute respiratory failure with hypoxia (HCC)   Anemia 1]acute respiratory failure with hypoxia  secondary to fluid overload pleural effusion/possible atypicalpneumonia-patient receives Lasix 40 mg daily at home she has a history of congestive heart failure. She is also put on antibiotics at this time for possible pneumonia. Will monitor closely and adjust the dose and titrate the dose of antibiotics as needed pharmacy following. Follow-up sputum culture. Continue Mucinex and nebulizers.  Marland Kitchenadd kdur.Chest x-ray shows improvement in pulmonary edema.patient is on levofloxacinq24hrs.she had esophagogram showed mild stricture at of eso at c6 level.no aspiration. 2]hyponatremia-chronicimproved.avoid hypotonic fluids.follow up sodium in am.  3}htn continue norvsc,cozaar.  4]hypothyroidism continue Synthroid      DVT prophylaxis: apixaban Code Status: dnr Family Communication dw son :Disposition Plan:tbd  Consultants: cardiology consult called in today  Procedures:none Antimicrobials:levaquin  Subjective:says not feeling well,anxious..   Objective:sitting up in bed,anxious. Vitals:   06/13/17 0913 06/13/17 1443 06/13/17 2227 06/14/17 0554  BP: (!) 143/59 107/67 120/66 122/66  Pulse: 84 62 65 60  Resp:  18 17 18   Temp:  97.7 F (36.5 C)  97.6 F (36.4 C)  TempSrc:  Oral  Oral  SpO2:  98% 99% 95%  Weight:    68.5 kg (151 lb)  Height:        Intake/Output Summary (Last 24 hours) at 06/14/2017 1138 Last data filed at 06/13/2017 2230 Gross per 24 hour  Intake 390 ml  Output 300 ml  Net 90 ml   Filed Weights   06/11/17 1200 06/12/17 0656 06/14/17 0554  Weight: 68 kg (149 lb 14.6 oz) 68 kg (150 lb) 68.5 kg (151 lb)    Examination:  General exam: Appears calm and comfortable  Respiratory system: decreased crackles left base auscultation. Respiratory effort normal. Cardiovascular system: S1 & S2 heard, RRR. No JVD, murmurs, rubs, gallops or clicks. No pedal edema. Gastrointestinal system: Abdomen is nondistended, soft and nontender. No organomegaly or masses  felt. Normal bowel sounds heard. Central nervous system: Alert and oriented. No focal neurological deficits. Extremities: Symmetric 5 x 5 power.no edema Skin: No rashes, lesions or ulcers Psychiatry: Judgement and insight appear normal. Mood & affect appropriate.     Data Reviewed: I have personally reviewed following labs and imaging studies  CBC: Recent Labs  Lab 06/11/17 0835 06/12/17 0516  WBC 7.4 8.7  HGB 10.9* 10.9*  HCT 33.4* 33.3*  MCV 91.3 91.5  PLT 261 161   Basic Metabolic Panel: Recent Labs  Lab 06/11/17 0835 06/11/17 1700 06/12/17 0516 06/13/17 0545 06/14/17 0557  NA 130* 130* 129* 134* 131*  K 3.6 3.7 3.8 3.2* 4.1  CL 94* 98* 97* 100* 99*  CO2 24 22 19* 25 23  GLUCOSE 103* 116* 132* 116* 107*  BUN 14 11 10 10 13   CREATININE 0.89 0.73 0.80 0.82 0.79  CALCIUM 9.0 8.6* 8.7* 9.3 9.4   GFR: Estimated Creatinine Clearance: 52.7 mL/min (by C-G formula based on SCr of 0.79 mg/dL). Liver Function Tests: Recent Labs  Lab 06/11/17 0835 06/12/17 0516  AST 26 30  ALT 15 16  ALKPHOS 59 51  BILITOT 1.1 1.2  PROT 6.7 6.2*  ALBUMIN 3.4* 3.2*   No results for input(s): LIPASE, AMYLASE in the last 168 hours. No results for input(s): AMMONIA in the last 168 hours. Coagulation Profile: No results for input(s): INR, PROTIME in the last 168 hours. Cardiac Enzymes: No results for input(s): CKTOTAL, CKMB, CKMBINDEX, TROPONINI in the last 168 hours. BNP (last 3 results) No results for input(s): PROBNP in the last 8760 hours. HbA1C: No results for input(s): HGBA1C in the last 72 hours. CBG: No results for input(s): GLUCAP in the last 168 hours. Lipid Profile: No results for input(s): CHOL, HDL, LDLCALC, TRIG, CHOLHDL, LDLDIRECT in the last 72 hours. Thyroid Function Tests: Recent Labs    06/11/17 1741  TSH 6.830*   Anemia Panel: Recent Labs    06/11/17 1700  VITAMINB12 589  FOLATE 42.0  FERRITIN 78  TIBC 398  IRON 67  RETICCTPCT 2.7   Sepsis  Labs: No results for input(s): PROCALCITON, LATICACIDVEN in the last 168 hours.  Recent Results (from the past 240 hour(s))  Urine culture     Status: None   Collection Time: 06/11/17 10:21 AM  Result Value Ref Range Status   Specimen Description URINE, CLEAN CATCH  Final   Special Requests Normal  Final   Culture NO GROWTH  Final   Report Status 06/12/2017 FINAL  Final         Radiology Studies: Dg Esophagus  Result Date: 06/12/2017 CLINICAL DATA:  Globus sensation. EXAM: ESOPHOGRAM/BARIUM SWALLOW TECHNIQUE: Single contrast examination was performed using  thin barium. FLUOROSCOPY TIME:  Fluoroscopy Time:  1 minutes 48 seconds Radiation Exposure Index (if provided by the fluoroscopic device): Number of Acquired Spot Images: 0 COMPARISON:  None. FINDINGS: The examination is limited by small sips and difficulty swallowing. Also positioning was challenging. Negative for aspiration. There is a stricture in the proximal esophagus at approximately the C6 level. This is difficult to image but appears to be a smooth posterior stricture, likely prominent cricopharyngeus muscle. Remainder of the esophagus has normal mucosa without ulcer or mass or stricture. No hiatal hernia. 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 Otherwise no significant abnormality. Electronically Signed   By: Franchot Gallo M.D.   On: 06/12/2017 15:51        Scheduled Meds: . ALPRAZolam  0.25 mg Oral QHS  . apixaban  5 mg Oral BID  . buPROPion  75 mg Oral Daily  . calcium-vitamin D  1 tablet Oral BID  . cholecalciferol  2,000 Units Oral Daily  . famotidine  10 mg Oral Daily  . furosemide  40 mg Oral Daily  . levothyroxine  75 mcg Oral QAC breakfast  . losartan  100 mg Oral Daily  . mouth rinse  15 mL Mouth Rinse BID  . Melatonin  9 mg Oral QHS  . memantine  28 mg Oral QHS  . metoprolol tartrate  25 mg Oral  BID  . multivitamin with minerals  1 tablet Oral Daily  . polyethylene glycol  17 g Oral Daily  . potassium chloride  20 mEq Oral Daily  . rivastigmine  9.5 mg Transdermal QHS  . traZODone  50 mg Oral QHS   Continuous Infusions: . levofloxacin (LEVAQUIN) IV       LOS: 3 days     Georgette Shell, MD [facility] If 7PM-7AM, please contact night-coverage [email or web address] Password [facility] 06/14/2017, 11:38 AM

## 2017-06-15 LAB — CBC
HCT: 30.4 % — ABNORMAL LOW (ref 36.0–46.0)
Hemoglobin: 9.7 g/dL — ABNORMAL LOW (ref 12.0–15.0)
MCH: 29.2 pg (ref 26.0–34.0)
MCHC: 31.9 g/dL (ref 30.0–36.0)
MCV: 91.6 fL (ref 78.0–100.0)
PLATELETS: 231 10*3/uL (ref 150–400)
RBC: 3.32 MIL/uL — ABNORMAL LOW (ref 3.87–5.11)
RDW: 14.5 % (ref 11.5–15.5)
WBC: 6 10*3/uL (ref 4.0–10.5)

## 2017-06-15 LAB — T4, FREE: Free T4: 1.62 ng/dL — ABNORMAL HIGH (ref 0.61–1.12)

## 2017-06-15 MED ORDER — FUROSEMIDE 40 MG PO TABS
40.0000 mg | ORAL_TABLET | Freq: Every day | ORAL | Status: DC
  Filled 2017-06-15: qty 1

## 2017-06-15 MED ORDER — METOPROLOL TARTRATE 12.5 MG HALF TABLET
12.5000 mg | ORAL_TABLET | Freq: Every day | ORAL | Status: DC
  Filled 2017-06-15: qty 1

## 2017-06-15 NOTE — Progress Notes (Signed)
PROGRESS NOTE    [name]  OIN:[id number] DOB: 07-05-1933 DOA: 06/11/2017 PCP: Lajean Manes, MD   Brief Narrative: :83 y.o.femalewithhistory of Alzheimer's dementia, living at home with around-the-clock care, atrial fibrillation on Eliquis, diastolic heart failure, presenting to the ED with 2-3-day history of increased drowsiness, especially over the last day. History is obtained by her son who is at bedside,as the patient is a level 5 caveat due to lethargy and dementia. She had increased fatigue, and today, she had a unwitnessed fall, landing on her left side. Patient was not complaining of pain in the left hip, or have any bleeding issues. Family denies being told about any fevers, chills, night sweats, nausea, vomiting or diarrhea. She was urinating well, without expressing any pain in the lower abdomen, or having hematuria. The only new medication is Wellbutrin, started 3 days ago. The rest of the medications, she takes on a regular basis, and is compliant to them. Is not on any antibiotics, and she may have had a sick contact at the recreational center that she attends. Family did not notice any unilateral weakness, or the patient having any trouble swallowing, or vision changes. No syncope or presyncope. He did not have any other falls prior to this event.  12/8-family requesting her cardiologist to come by and see the patient.  I have left a message with the answering service.  They told me Dr. Curt Bears is her cardiologist and have left a message with the office.  patient is somewhat anxious this morning otherwise was breathing seems to be better    Assessment & Plan:   Active Problems:   Dementia   Atrial fibrillation (HCC)   Hypertensive heart disease   Mitral regurgitation   Chronic anticoagulation   Diastolic CHF, chronic (HCC)   CAP (community acquired pneumonia)   Acute respiratory failure with hypoxia (HCC)   Anemia  1]acute respiratory failure  with hypoxia secondary to fluid overload pleural effusion/possible atypicalpneumonia-patient receives Lasix 40 mg daily at home she has a history of congestive heart failURE.Mild stricture at of eso at c6 level.no aspiration. 2]hyponatremia-chronicimproved.avoid hypotonic fluids.follow up sodium in am.  3}htn continue norvsc,cozaar.  4]hypothyroidism continue Synthroid  5]HTN BP SOFT.DECREASE antihypertensives.       DVT prophylaxis: APIXABAN Code Status:dnr Family Communication:none  Disposition Plan:  tbd Consultants:  none  Procedures: none Antimicrobials: none Subjective:better Objective:rsting in bed.in nad Vitals:   06/14/17 0554 06/14/17 2220 06/15/17 0519 06/15/17 0535  BP: 122/66 (!) 128/56 (!) 83/45 (!) 90/52  Pulse: 60 (!) 58 (!) 59 62  Resp: 18 18 18    Temp: 97.6 F (36.4 C) 97.9 F (36.6 C) 98 F (36.7 C)   TempSrc: Oral Oral Oral   SpO2: 95% 100% 96%   Weight: 68.5 kg (151 lb)  68.1 kg (150 lb 3.2 oz)   Height:        Intake/Output Summary (Last 24 hours) at 06/15/2017 1341 Last data filed at 06/15/2017 1054 Gross per 24 hour  Intake 390 ml  Output 1600 ml  Net -1210 ml   Filed Weights   06/12/17 0656 06/14/17 0554 06/15/17 0519  Weight: 68 kg (150 lb) 68.5 kg (151 lb) 68.1 kg (150 lb 3.2 oz)    Examination:  General exam: Appears calm and comfortable  Respiratory system: Clear to auscultation. Respiratory effort normal. Cardiovascular system: S1 & S2 heard, RRR. No JVD, murmurs, rubs, gallops or clicks. No pedal edema. Gastrointestinal system: Abdomen is nondistended, soft and nontender. No organomegaly or  masses felt. Normal bowel sounds heard. Central nervous system: Alert and oriented. No focal neurological deficits. Extremities: Symmetric 5 x 5 power. Skin: No rashes, lesions or ulcers Psychiatry: Judgement and insight appear normal. Mood & affect appropriate.     Data Reviewed: I have personally reviewed following labs and  imaging studies  CBC: Recent Labs  Lab 06/11/17 0835 06/12/17 0516 06/15/17 0213  WBC 7.4 8.7 6.0  HGB 10.9* 10.9* 9.7*  HCT 33.4* 33.3* 30.4*  MCV 91.3 91.5 91.6  PLT 261 254 323   Basic Metabolic Panel: Recent Labs  Lab 06/11/17 0835 06/11/17 1700 06/12/17 0516 06/13/17 0545 06/14/17 0557  NA 130* 130* 129* 134* 131*  K 3.6 3.7 3.8 3.2* 4.1  CL 94* 98* 97* 100* 99*  CO2 24 22 19* 25 23  GLUCOSE 103* 116* 132* 116* 107*  BUN 14 11 10 10 13   CREATININE 0.89 0.73 0.80 0.82 0.79  CALCIUM 9.0 8.6* 8.7* 9.3 9.4   GFR: Estimated Creatinine Clearance: 52.7 mL/min (by C-G formula based on SCr of 0.79 mg/dL). Liver Function Tests: Recent Labs  Lab 06/11/17 0835 06/12/17 0516  AST 26 30  ALT 15 16  ALKPHOS 59 51  BILITOT 1.1 1.2  PROT 6.7 6.2*  ALBUMIN 3.4* 3.2*   No results for input(s): LIPASE, AMYLASE in the last 168 hours. No results for input(s): AMMONIA in the last 168 hours. Coagulation Profile: No results for input(s): INR, PROTIME in the last 168 hours. Cardiac Enzymes: No results for input(s): CKTOTAL, CKMB, CKMBINDEX, TROPONINI in the last 168 hours. BNP (last 3 results) No results for input(s): PROBNP in the last 8760 hours. HbA1C: No results for input(s): HGBA1C in the last 72 hours. CBG: No results for input(s): GLUCAP in the last 168 hours. Lipid Profile: No results for input(s): CHOL, HDL, LDLCALC, TRIG, CHOLHDL, LDLDIRECT in the last 72 hours. Thyroid Function Tests: Recent Labs    06/15/17 0213  FREET4 1.62*   Anemia Panel: No results for input(s): VITAMINB12, FOLATE, FERRITIN, TIBC, IRON, RETICCTPCT in the last 72 hours. Sepsis Labs: No results for input(s): PROCALCITON, LATICACIDVEN in the last 168 hours.  Recent Results (from the past 240 hour(s))  Urine culture     Status: None   Collection Time: 06/11/17 10:21 AM  Result Value Ref Range Status   Specimen Description URINE, CLEAN CATCH  Final   Special Requests Normal  Final    Culture NO GROWTH  Final   Report Status 06/12/2017 FINAL  Final         Radiology Studies: Dg Chest Port 1 View  Result Date: 06/14/2017 CLINICAL DATA:  Shortness of breath. EXAM: PORTABLE CHEST 1 VIEW COMPARISON:  06/12/2017 FINDINGS: Persistent bilateral effusions larger on the left than the right with left base atelectasis. Upper lobes remain will aerated. Less generalized pulmonary edema. IMPRESSION: Less edema, but persistent bilateral effusions and lower lobe volume loss left more than right. Electronically Signed   By: Nelson Chimes M.D.   On: 06/14/2017 13:48        Scheduled Meds: . ALPRAZolam  0.25 mg Oral QHS  . apixaban  5 mg Oral BID  . buPROPion  75 mg Oral Daily  . calcium-vitamin D  1 tablet Oral BID  . cholecalciferol  2,000 Units Oral Daily  . famotidine  10 mg Oral Daily  . furosemide  40 mg Oral BID  . levothyroxine  75 mcg Oral QAC breakfast  . losartan  25 mg Oral Daily  .  mouth rinse  15 mL Mouth Rinse BID  . Melatonin  9 mg Oral QHS  . memantine  28 mg Oral QHS  . metoprolol tartrate  25 mg Oral BID  . multivitamin with minerals  1 tablet Oral Daily  . polyethylene glycol  17 g Oral Daily  . potassium chloride  20 mEq Oral Daily  . rivastigmine  9.5 mg Transdermal QHS  . traZODone  50 mg Oral QHS   Continuous Infusions: . levofloxacin (LEVAQUIN) IV Stopped (06/14/17 1549)     LOS: 4 days      Georgette Shell, MD [facility]  If 7PM-7AM, please contact night-coverage [email or web address] Password [email or web address] 06/15/2017, 1:41 PM

## 2017-06-15 NOTE — Progress Notes (Signed)
CSW received phone call from pt's son requesting information be faxed to [facility] for possible placement for mother.  CSW spoke with Kassie Mends at [facility] [phone] who requested clinical information to be faxed to review patient for possible memory care admission.  Reed Breech, [place]

## 2017-06-16 LAB — CBC WITH DIFFERENTIAL/PLATELET
Basophils Absolute: 0 10*3/uL (ref 0.0–0.1)
Basophils Relative: 1 %
EOS ABS: 0.1 10*3/uL (ref 0.0–0.7)
EOS PCT: 2 %
HCT: 30.8 % — ABNORMAL LOW (ref 36.0–46.0)
HEMOGLOBIN: 9.8 g/dL — AB (ref 12.0–15.0)
Lymphocytes Relative: 19 %
Lymphs Abs: 1.3 10*3/uL (ref 0.7–4.0)
MCH: 29.4 pg (ref 26.0–34.0)
MCHC: 31.8 g/dL (ref 30.0–36.0)
MCV: 92.5 fL (ref 78.0–100.0)
MONOS PCT: 14 %
Monocytes Absolute: 0.9 10*3/uL (ref 0.1–1.0)
NEUTROS PCT: 64 %
Neutro Abs: 4.3 10*3/uL (ref 1.7–7.7)
Platelets: 262 10*3/uL (ref 150–400)
RBC: 3.33 MIL/uL — ABNORMAL LOW (ref 3.87–5.11)
RDW: 14.9 % (ref 11.5–15.5)
WBC: 6.6 10*3/uL (ref 4.0–10.5)

## 2017-06-16 LAB — BASIC METABOLIC PANEL
Anion gap: 9 (ref 5–15)
BUN: 14 mg/dL (ref 6–20)
CALCIUM: 9.3 mg/dL (ref 8.9–10.3)
CO2: 26 mmol/L (ref 22–32)
CREATININE: 0.9 mg/dL (ref 0.44–1.00)
Chloride: 100 mmol/L — ABNORMAL LOW (ref 101–111)
GFR, EST NON AFRICAN AMERICAN: 58 mL/min — AB (ref >60)
Glucose, Bld: 106 mg/dL — ABNORMAL HIGH (ref 65–99)
Potassium: 4.2 mmol/L (ref 3.5–5.1)
SODIUM: 135 mmol/L (ref 135–145)

## 2017-06-16 MED ORDER — FUROSEMIDE 20 MG PO TABS
20.0000 mg | ORAL_TABLET | Freq: Every day | ORAL | Status: DC
  Administered 2017-06-17: 20 mg via ORAL
  Filled 2017-06-16: qty 1

## 2017-06-16 MED ORDER — POTASSIUM CHLORIDE CRYS ER 10 MEQ PO TBCR
10.0000 meq | EXTENDED_RELEASE_TABLET | Freq: Every day | ORAL | Status: DC
  Administered 2017-06-17: 10 meq via ORAL
  Filled 2017-06-16: qty 1

## 2017-06-16 NOTE — Plan of Care (Signed)
Patient remained well throughout the shift. Patient has a Biomedical engineer that remained with patient throughout the shift.

## 2017-06-16 NOTE — Progress Notes (Signed)
PROGRESS NOTE    [name]  EXH:[id number] DOB: 10-04-1932 DOA: 06/11/2017 PCP: Lajean Manes, MD    Brief Narrative: 83 y.o.femalewithhistory of Alzheimer's dementia, living at home with around-the-clock care, atrial fibrillation on Eliquis, diastolic heart failure, presenting to the ED with 2-3-day history of increased drowsiness, especially over the last day. History is obtained by her son who is at bedside,as the patient is a level 5 caveat due to lethargy and dementia. She had increased fatigue, and today, she had a unwitnessed fall, landing on her left side. Patient was not complaining of pain in the left hip, or have any bleeding issues. Family denies being told about any fevers, chills, night sweats, nausea, vomiting or diarrhea. She was urinating well, without expressing any pain in the lower abdomen, or having hematuria. The only new medication is Wellbutrin, started 3 days ago. The rest of the medications, she takes on a regular basis, and is compliant to them. Is not on any antibiotics, and she may have had a sick contact at the recreational center that she attends. Family did not notice any unilateral weakness, or the patient having any trouble swallowing, or vision changes. No syncope or presyncope. He did not have any other falls prior to this event.  12/8-family requesting her cardiologist to come by and see the patient. I have left a message with the answering service. They told me Dr. Curt Bears is her cardiologist and have left a message with the office. patientis somewhat anxious this morning otherwise was breathing seems to be better   Assessment & Plan:   Active Problems:   Dementia   Atrial fibrillation (HCC)   Hypertensive heart disease   Mitral regurgitation   Chronic anticoagulation   Diastolic CHF, chronic (HCC)   CAP (community acquired pneumonia)   Acute respiratory failure with hypoxia (HCC)   Anemia   1]acute respiratory failure  with hypoxia secondary to fluid overload pleural effusion/possible atypicalpneumonia-patient receives Lasix 40 mg daily at home she has a history of congestive heart failURE.Mild stricture at of eso at c6 level.no aspiration. 2]hyponatremia resolved.  3}htn continue norvsc,cozaar.  4]hypothyroidism continue Synthroid  5]HTN BP SOFT.DECREASE antihypertensives.    DVT prophylaxis:apixaban  Code Status dnr :Family Communication dw son : Disposition Plan:  Plan dc tomorrow to memory care?snf Consultants:  none Procedures:none Antimicrobials: none  Subjective:no complaints.   Objective:sitting up in chair.in nad. Vitals:   06/16/17 3810 06/16/17 1010 06/16/17 1018 06/16/17 1244  BP: (!) 113/56  (!) 123/54 (!) 124/51  Pulse: 60 67 61 61  Resp: 16   17  Temp: 98.2 F (36.8 C)   97.8 F (36.6 C)  TempSrc: Oral   Oral  SpO2: 100% (!) 88%  97%  Weight:      Height:        Intake/Output Summary (Last 24 hours) at 06/16/2017 1308 Last data filed at 06/16/2017 1056 Gross per 24 hour  Intake 240 ml  Output 200 ml  Net 40 ml   Filed Weights   06/12/17 0656 06/14/17 0554 06/15/17 0519  Weight: 68 kg (150 lb) 68.5 kg (151 lb) 68.1 kg (150 lb 3.2 oz)    Examination:  General exam: Appears calm and comfortable  Respiratory system: Clear to auscultation. Respiratory effort normal. Cardiovascular system: S1 & S2 heard, RRR. No JVD, murmurs, rubs, gallops or clicks. No pedal edema. Gastrointestinal system: Abdomen is nondistended, soft and nontender. No organomegaly or masses felt. Normal bowel sounds heard. Central nervous system: Alert and oriented.  No focal neurological deficits. Extremities: Symmetric 5 x 5 power. Skin: No rashes, lesions or ulcers Psychiatry: Judgement and insight appear normal. Mood & affect appropriate.     Data Reviewed: I have personally reviewed following labs and imaging studies  CBC: Recent Labs  Lab 06/11/17 0835 06/12/17 0516  06/15/17 0213 06/16/17 0153  WBC 7.4 8.7 6.0 6.6  NEUTROABS  --   --   --  4.3  HGB 10.9* 10.9* 9.7* 9.8*  HCT 33.4* 33.3* 30.4* 30.8*  MCV 91.3 91.5 91.6 92.5  PLT 261 254 231 825   Basic Metabolic Panel: Recent Labs  Lab 06/11/17 1700 06/12/17 0516 06/13/17 0545 06/14/17 0557 06/16/17 0153  NA 130* 129* 134* 131* 135  K 3.7 3.8 3.2* 4.1 4.2  CL 98* 97* 100* 99* 100*  CO2 22 19* 25 23 26   GLUCOSE 116* 132* 116* 107* 106*  BUN 11 10 10 13 14   CREATININE 0.73 0.80 0.82 0.79 0.90  CALCIUM 8.6* 8.7* 9.3 9.4 9.3   GFR: Estimated Creatinine Clearance: 46.9 mL/min (by C-G formula based on SCr of 0.9 mg/dL). Liver Function Tests: Recent Labs  Lab 06/11/17 0835 06/12/17 0516  AST 26 30  ALT 15 16  ALKPHOS 59 51  BILITOT 1.1 1.2  PROT 6.7 6.2*  ALBUMIN 3.4* 3.2*   No results for input(s): LIPASE, AMYLASE in the last 168 hours. No results for input(s): AMMONIA in the last 168 hours. Coagulation Profile: No results for input(s): INR, PROTIME in the last 168 hours. Cardiac Enzymes: No results for input(s): CKTOTAL, CKMB, CKMBINDEX, TROPONINI in the last 168 hours. BNP (last 3 results) No results for input(s): PROBNP in the last 8760 hours. HbA1C: No results for input(s): HGBA1C in the last 72 hours. CBG: No results for input(s): GLUCAP in the last 168 hours. Lipid Profile: No results for input(s): CHOL, HDL, LDLCALC, TRIG, CHOLHDL, LDLDIRECT in the last 72 hours. Thyroid Function Tests: Recent Labs    06/15/17 0213  FREET4 1.62*   Anemia Panel: No results for input(s): VITAMINB12, FOLATE, FERRITIN, TIBC, IRON, RETICCTPCT in the last 72 hours. Sepsis Labs: No results for input(s): PROCALCITON, LATICACIDVEN in the last 168 hours.  Recent Results (from the past 240 hour(s))  Urine culture     Status: None   Collection Time: 06/11/17 10:21 AM  Result Value Ref Range Status   Specimen Description URINE, CLEAN CATCH  Final   Special Requests Normal  Final    Culture NO GROWTH  Final   Report Status 06/12/2017 FINAL  Final         Radiology Studies: No results found.      Scheduled Meds: . ALPRAZolam  0.25 mg Oral QHS  . apixaban  5 mg Oral BID  . buPROPion  75 mg Oral Daily  . calcium-vitamin D  1 tablet Oral BID  . cholecalciferol  2,000 Units Oral Daily  . famotidine  10 mg Oral Daily  . [START ON 06/17/2017] furosemide  20 mg Oral Daily  . levothyroxine  75 mcg Oral QAC breakfast  . mouth rinse  15 mL Mouth Rinse BID  . Melatonin  9 mg Oral QHS  . memantine  28 mg Oral QHS  . multivitamin with minerals  1 tablet Oral Daily  . polyethylene glycol  17 g Oral Daily  . [START ON 06/17/2017] potassium chloride  10 mEq Oral Daily  . rivastigmine  9.5 mg Transdermal QHS  . traZODone  50 mg Oral QHS   Continuous  Infusions:   LOS: 5 days      Georgette Shell, MD [facility]  If 7PM-7AM, please contact night-coverage [email or web address] Password [email or web address] 06/16/2017, 1:08 PM

## 2017-06-16 NOTE — Progress Notes (Signed)
Physical Therapy Treatment Patient Details Name: [name] MRN: [id number] DOB: 08-08-1932 Today's Date: 06/16/2017    History of Present Illness Pt is a 83 y.o. female with history of Alzheimer's dementia, living at home with around-the-clock care, atrial fibrillation on Eliquis, diastolic heart failure, presenting to the ED with 2-3-day history of increased drowsiness, Orra Pt had a unwitnessed fall, landing on her left side. Dx acute respiratory failure with hypoxia secondary to aspiration pneumonia    PT Comments    Pt tolerating treatment session well, motivated and able to progress mobility as planned. Pt able to progress AMB to 30feet with RW at minGuard assist on room air, but desats to 88%, with 60sec recovery to SpO2: 90%. Pt continues to make progress toward goals as evidenced by decreased reliance on supplemental O2, and improved tolerance to AMB distance. Pt's greatest limitation continues to be weakness in legs, which continues to limit ability to perform STS transfers at baseline function. Session ended early as not to interfere with care from nursing. Patient presenting with impairment of strength, pain, range of motion, balance, and activity tolerance, limiting ability to perform ADL and mobility tasks at  baseline level of function. Patient will benefit from skilled intervention to address the above impairments and limitations, in order to restore to prior level of function, improve patient safety upon discharge, and to decrease caregiver burden.     Follow Up Recommendations  SNF     Equipment Recommendations  None recommended by PT(To be determined by facility)    Recommendations for Other Services       Precautions / Restrictions Precautions Precautions: Fall Restrictions Weight Bearing Restrictions: No    Mobility  Bed Mobility Overal bed mobility: (received up in recliner upon entry)                Transfers Overall transfer level: Needs  assistance Equipment used: 1 person hand held assist;Rolling walker (2 wheeled) Transfers: Sit to/from Stand Sit to Stand: Min assist         General transfer comment: requires max effort + MinA physical assist, VC for upright stance. performed 2x   Ambulation/Gait Ambulation/Gait assistance: Min guard Ambulation Distance (Feet): 70 Feet(AMB 29ft on room air, vitals assessed SpO2: 90% on RA; then 81ft, terminal SpO2: 88%, recovery to 90% on room air after 60sec seated) Assistive device: Rolling walker (2 wheeled)   Gait velocity: 0.40m/s  Gait velocity interpretation: <1.8 ft/sec, indicative of risk for recurrent falls General Gait Details: difficulty managing RW (new task for her), and turns are most difficulty with demonstrated shuffling   Stairs            Wheelchair Mobility    Modified Rankin (Stroke Patients Only)       Balance Overall balance assessment: Needs assistance Sitting-balance support: No upper extremity supported;Feet supported Sitting balance-Leahy Scale: Good     Standing balance support: During functional activity;Bilateral upper extremity supported Standing balance-Leahy Scale: Good                              Cognition Arousal/Alertness: Awake/alert Behavior During Therapy: WFL for tasks assessed/performed Overall Cognitive Status: History of cognitive impairments - at baseline                                        Exercises  General Comments        Pertinent Vitals/Pain Pain Assessment: No/denies pain    Home Living                      Prior Function            PT Goals (current goals can now be found in the care plan section) Acute Rehab PT Goals Patient Stated Goal: regain strength and PLOF in mobiility  PT Goal Formulation: With family Time For Goal Achievement: 06/26/17 Potential to Achieve Goals: Fair Progress towards PT goals: Progressing toward goals     Frequency    Min 2X/week      PT Plan Current plan remains appropriate    Co-evaluation              AM-PAC PT "6 Clicks" Daily Activity  Outcome Measure  Difficulty turning over in bed (including adjusting bedclothes, sheets and blankets)?: A Little Difficulty moving from lying on back to sitting on the side of the bed? : A Lot Difficulty sitting down on and standing up from a chair with arms (e.g., wheelchair, bedside commode, etc,.)?: Unable Help needed moving to and from a bed to chair (including a wheelchair)?: A Lot Help needed walking in hospital room?: A Lot Help needed climbing 3-5 steps with a railing? : Total 6 Click Score: 11    End of Session Equipment Utilized During Treatment: Gait belt;Oxygen Activity Tolerance: Patient tolerated treatment well;No increased pain;Patient limited by fatigue Patient left: in chair;with nursing/sitter in room;with family/visitor present;with call bell/phone within reach Nurse Communication: Mobility status(vitals) PT Visit Diagnosis: Unsteadiness on feet (R26.81);Other abnormalities of gait and mobility (R26.89);Repeated falls (R29.6);Muscle weakness (generalized) (M62.81);Difficulty in walking, not elsewhere classified (R26.2);Other symptoms and signs involving the nervous system (R29.898)     Time: 0109-3235 PT Time Calculation (min) (ACUTE ONLY): 21 min  Charges:  $Therapeutic Activity: 8-22 mins                    G Codes:      10:31 AM, 06-28-17 Etta Grandchild, PT, DPT [facility] Physical Therapist - [place] [phone] (Pager)  [phone] (Mobile)  [phone] (Office)      Yadier Bramhall C 06-28-17, 10:29 AM

## 2017-06-16 NOTE — Progress Notes (Signed)
CM spoke with pt's son, Jenny Reichmann, regarding pt's d/c plan. CM explained pt is medically ready to transition to next level, ex . SNF placement. John stated he would like his mom to d/c to [facility] memory care unit and stated the CSW has been assisting with that process. John informed CM he's going to f/u with [facility] today. CM called and spoke with Kassie Mends at [facility] [phone]) and was told pt is being reviewed and bed availability could present on tomorrow. Kassie Mends  to f/u with CM or CSW Percell Locus @ [phone]. Whitman Hero CM [phone]

## 2017-06-17 LAB — BASIC METABOLIC PANEL
Anion gap: 8 (ref 5–15)
BUN: 12 mg/dL (ref 6–20)
CHLORIDE: 99 mmol/L — AB (ref 101–111)
CO2: 25 mmol/L (ref 22–32)
Calcium: 9.7 mg/dL (ref 8.9–10.3)
Creatinine, Ser: 0.86 mg/dL (ref 0.44–1.00)
GFR calc non Af Amer: >60 mL/min (ref >60)
Glucose, Bld: 115 mg/dL — ABNORMAL HIGH (ref 65–99)
POTASSIUM: 4.4 mmol/L (ref 3.5–5.1)
SODIUM: 132 mmol/L — AB (ref 135–145)

## 2017-06-17 MED ORDER — CALCIUM CARBONATE-VITAMIN D 500-200 MG-UNIT PO TABS: 1.0000 | ORAL_TABLET | Freq: Two times a day (BID) | ORAL | Status: AC

## 2017-06-17 MED ORDER — FUROSEMIDE 20 MG PO TABS: 20.0000 mg | ORAL_TABLET | Freq: Every day | ORAL | 0 refills | Status: AC

## 2017-06-17 MED ORDER — POTASSIUM CHLORIDE CRYS ER 10 MEQ PO TBCR: 10.0000 meq | EXTENDED_RELEASE_TABLET | Freq: Every day | ORAL | 0 refills | Status: AC

## 2017-06-17 MED ORDER — BISACODYL 5 MG PO TBEC
10.0000 mg | DELAYED_RELEASE_TABLET | Freq: Every day | ORAL | 0 refills | Status: AC | PRN
Start: 2017-06-17 — End: ?

## 2017-06-17 MED ORDER — POLYETHYLENE GLYCOL 3350 17 G PO PACK: 17.0000 g | PACK | Freq: Every day | ORAL | 0 refills | Status: AC

## 2017-06-17 NOTE — Progress Notes (Signed)
  Speech Language Pathology Treatment: Dysphagia  Patient Details Name: [name] MRN: [id number] DOB: 1932-12-14 Today's Date: 06/17/2017 Time: 0370-9643 SLP Time Calculation (min) (ACUTE ONLY): 10 min  Assessment / Plan / Recommendation Clinical Impression  Pt consumed trials of thin liquids with frequent throat clearing noted, similar to documentation from initial evaluation. Barium swallow completed in the interim showed no aspiration and her vocal quality remains clear. She appears to be tolerating her current diet as she remains afebrile and with lung sounds unchanged. Only Min cues were provided for smaller sips/slower pacing. Recommend to continue current diet and precautions. SLP has met acute goals and is planned for d/c potentially today. Will defer additional f/u to next level of care.   HPI HPI: 83 year old female admitted 06/11/17 with left hip pain after a fall at home. PMH significant for dementia, Afib, diastolic heart failure. CT - negative for acute findings, CXR bibasilar airspace disease, L>R.       SLP Plan  Continue with current plan of care       Recommendations  Diet recommendations: Dysphagia 2 (fine chop);Thin liquid Liquids provided via: Cup;No straw Medication Administration: Crushed with puree Supervision: Patient able to self feed;Full supervision/cueing for compensatory strategies Compensations: Minimize environmental distractions;Slow rate;Small sips/bites;Follow solids with liquid Postural Changes and/or Swallow Maneuvers: Out of bed for meals;Seated upright 90 degrees;Upright 30-60 min after meal                Oral Care Recommendations: Oral care BID Follow up Recommendations: 24 hour supervision/assistance SLP Visit Diagnosis: Dysphagia, unspecified (R13.10) Plan: Continue with current plan of care       GO                Germain Osgood 06/17/2017, 11:12 AM  Germain Osgood, M.A. CCC-SLP [phone]

## 2017-06-17 NOTE — Progress Notes (Signed)
CSW left voicemail for Ivin Booty at [facility] to check the status of patient admission. CSW also spoke with Dorian Pod at [facility]. They may be able to accept patient under ALF side until memory care bed opens up on Friday. [facility] also has a bed available at this time for short term SNF placement. CSW following up with patient's son.   Percell Locus Nikia Levels LCSWA [phone]

## 2017-06-17 NOTE — Clinical Social Work Placement (Signed)
   CLINICAL SOCIAL WORK PLACEMENT  NOTE  Date:  06/17/2017  Patient Details  Name: [name] MRN: [id number] Date of Birth: 11-01-1932  Clinical Social Work is seeking post-discharge placement for this patient at the [place] level of care (*CSW will initial, date and re-position this form in  chart as items are completed):  Yes   Patient/family provided with [facility] Work Department's list of facilities offering this level of care within the geographic area requested by the patient (or if unable, by the patient's family).  Yes   Patient/family informed of their freedom to choose among providers that offer the needed level of care, that participate in Medicare, Medicaid or managed care program needed by the patient, have an available bed and are willing to accept the patient.  Yes   Patient/family informed of [facility]'s ownership interest in [facility] and [facility], as well as of the fact that they are under no obligation to receive care at these facilities.  PASRR submitted to EDS on       PASRR number received on       Existing PASRR number confirmed on 06/12/17     FL2 transmitted to all facilities in geographic area requested by pt/family on 06/12/17     FL2 transmitted to all facilities within larger geographic area on       Patient informed that his/her managed care company has contracts with or will negotiate with certain facilities, including the following:        Yes   Patient/family informed of bed offers received.  Patient chooses bed at [facility]     Physician recommends and patient chooses bed at      Patient to be transferred to [place] on 06/17/17.  Patient to be transferred to facility by [facility]     Patient family notified on 06/17/17 of transfer.  Name of family member notified:  Jenny Reichmann, son     PHYSICIAN       Additional Comment:     _______________________________________________ Benard Halsted, [place] 06/17/2017, 3:58 PM

## 2017-06-17 NOTE — Progress Notes (Signed)
Patient able to discharge to [facility] today. MD signed requested documents.   Percell Locus Borden Thune LCSWA [phone]

## 2017-06-17 NOTE — NC FL2 (Signed)
[place] LEVEL OF CARE SCREENING TOOL     IDENTIFICATION  Patient Name: [name] Birthdate: [birth year] Sex: female Admission Date (Current Location): 06/11/2017  [facility] and [place] Number:  Herbalist and Address:  [facility] [place]. [facility], [place] [address] [place], [place] [address]      Provider Number: [id number]  Attending Physician Name and Address:  Georgette Shell, MD  Relative Name and Phone Number:  Jenny Reichmann, son, [phone]    Current Level of Care: Hospital Recommended Level of Care: Memory Care Prior Approval Number:    Date Approved/Denied:   PASRR Number: [id number]  Discharge Plan: Other (Comment)(Memory Care)    Current Diagnoses: Patient Active Problem List   Diagnosis Date Noted  . CAP (community acquired pneumonia) 06/11/2017  . Acute respiratory failure with hypoxia ([place]) 06/11/2017  . Anemia 06/11/2017  . 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 Bradycardia 07/29/2015  . Atrial fibrillation with slow ventricular response ([place])   . Second degree AV block   . Diastolic CHF, chronic ([place])   . Chronic anticoagulation   . PCO (posterior capsular opacification), right 12/22/2013  . Pseudophakia 12/22/2013  . Atrial fibrillation ([place])   . Hypertensive heart disease   . Mitral regurgitation   . Dementia 08/01/2012  . Nuclear cataract 08/14/2011    Orientation RESPIRATION BLADDER Height & Weight     Self  Normal Continent Weight: 70 kg (154 lb 5.2 oz) Height:  5\' 7"  (170.2 cm)  BEHAVIORAL SYMPTOMS/MOOD NEUROLOGICAL BOWEL NUTRITION STATUS  Other (Comment)(Tries to get out of bed at night by herself)   Continent Diet(Dysphagia 2 (fine chop);Thin liquid)  AMBULATORY STATUS COMMUNICATION OF NEEDS Skin   Limited Assist Verbally Normal                       Personal Care Assistance Level of Assistance  Bathing, Feeding, Dressing Bathing Assistance: Limited assistance Feeding assistance:  Independent Dressing Assistance: Limited assistance     Functional Limitations Info             SPECIAL CARE FACTORS FREQUENCY  PT (By licensed PT), OT (By licensed OT)     PT Frequency: 3x/week OT Frequency: 2x/week            Contractures Contractures Info: Not present    Additional Factors Info  Code Status, Allergies, Psychotropic Code Status Info: DNR Allergies Info: Dilaudid Hydromorphone Hcl, Vicodin Hydrocodone-acetaminophen Psychotropic Info: Xanax, Wellbutrin, Trazadone         Current Medications (06/17/2017):  This is the current hospital active medication list Current Facility-Administered Medications  Medication Dose Route Frequency Provider Last Rate Last Dose  . albuterol (PROVENTIL) (2.5 MG/3ML) 0.083% nebulizer solution 2.5 mg  2.5 mg Nebulization Q4H PRN Rondel Jumbo, PA-C      . ALPRAZolam Duanne Moron) tablet 0.125 mg  0.125 mg Oral TID PRN Georgette Shell, MD   0.125 mg at 06/17/17 0445  . ALPRAZolam Duanne Moron) tablet 0.25 mg  0.25 mg Oral QHS Georgette Shell, MD   0.25 mg at 06/16/17 2106  . apixaban (ELIQUIS) tablet 5 mg  5 mg Oral BID Rondel Jumbo, PA-C   5 mg at 06/17/17 7858  . bisacodyl (DULCOLAX) EC tablet 10 mg  10 mg Oral Daily PRN Georgette Shell, MD      . buPROPion [facility]) tablet 75 mg  75 mg Oral Daily Rondel Jumbo, PA-C   75 mg at 06/17/17  27  . calcium-vitamin D (OSCAL WITH D) 500-200 MG-UNIT per tablet 1 tablet  1 tablet Oral BID Roxan Hockey, MD   1 tablet at 06/17/17 0859  . cholecalciferol (VITAMIN D) tablet 2,000 Units  2,000 Units Oral Daily Rondel Jumbo, PA-C   2,000 Units at 06/17/17 8937  . famotidine (PEPCID) tablet 10 mg  10 mg Oral Daily Rondel Jumbo, PA-C   10 mg at 06/17/17 3428  . furosemide (LASIX) tablet 20 mg  20 mg Oral Daily Georgette Shell, MD   20 mg at 06/17/17 0900  . guaiFENesin (MUCINEX) 12 hr tablet 600 mg  600 mg Oral BID PRN Rondel Jumbo, PA-C      . levothyroxine  (SYNTHROID, LEVOTHROID) tablet 75 mcg  75 mcg Oral QAC breakfast Rondel Jumbo, PA-C   75 mcg at 06/17/17 0857  . MEDLINE mouth rinse  15 mL Mouth Rinse BID Denton Brick, Courage, MD   15 mL at 06/16/17 2106  . Melatonin TABS 9 mg  9 mg Oral QHS Emokpae, Courage, MD   9 mg at 06/16/17 2105  . memantine (NAMENDA XR) 24 hr capsule 28 mg  28 mg Oral QHS Rondel Jumbo, PA-C   28 mg at 06/16/17 2105  . multivitamin with minerals tablet 1 tablet  1 tablet Oral Daily Rondel Jumbo, PA-C   1 tablet at 06/17/17 7681  . polyethylene glycol (MIRALAX / GLYCOLAX) packet 17 g  17 g Oral Daily Georgette Shell, MD   17 g at 06/17/17 0900  . potassium chloride (K-DUR,KLOR-CON) CR tablet 10 mEq  10 mEq Oral Daily Georgette Shell, MD   10 mEq at 06/17/17 0900  . rivastigmine (EXELON) 9.5 mg/24hr 9.5 mg  9.5 mg Transdermal QHS Emokpae, Courage, MD   9.5 mg at 06/16/17 2104  . traZODone (DESYREL) tablet 50 mg  50 mg Oral QHS Georgette Shell, MD   50 mg at 06/16/17 2105     Discharge Medications: Please see discharge summary for a list of discharge medications.  Relevant Imaging Results:  Relevant Lab Results:   Additional Information SSN: [place], [place]

## 2017-06-17 NOTE — Discharge Summary (Deleted)
Physician Discharge Summary  [name] ZDG:[id number] DOB: Oct 20, 1932 DOA: 06/11/2017  PCP: Lajean Manes, MD  Admit date: 06/11/2017 Discharge date: 06/17/2017  Admitted From:HOME Disposition: ASSISTED LIVING  Recommendations for Outpatient Follow-up:  1. Follow up with PCP in 1-2 weeks 2. Please obtain BMP/CBC in one week  Home Health:NONE Equipment/Devices: NONE Discharge Condition STABLE CODE STATUS:DNR Diet recommendation: CARDIAC Brief/Interim Summary:83 y.o.femalewithhistory of Alzheimer's dementia, living at home with around-the-clock care, atrial fibrillation on Eliquis, diastolic heart failure, presenting to the ED with 2-3-day history of increased drowsiness, especially over the last day. History is obtained by her son who is at bedside,as the patient is a level 5 caveat due to lethargy and dementia. She had increased fatigue, and today, she had a unwitnessed fall, landing on her left side. Patient was not complaining of pain in the left hip, or have any bleeding issues. Family denies being told about any fevers, chills, night sweats, nausea, vomiting or diarrhea. She was urinating well, without expressing any pain in the lower abdomen, or having hematuria. The only new medication is Wellbutrin, started 3 days ago. The rest of the medications, she takes on a regular basis, and is compliant to them. Is not on any antibiotics, and she may have had a sick contact at the recreational center that she attends. Family did not notice any unilateral weakness, or the patient having any trouble swallowing, or vision changes. No syncope or presyncope. He did not have any other falls prior to this event.  12/8-family requesting her cardiologist to come by and see the patient. I have left a message with the answering service. They told me Dr. Curt Bears is her cardiologist and have left a message with the office. patientis somewhat anxious this morning otherwise was  breathing seems to be better   Discharge Diagnoses:  Active Problems:   Dementia   Atrial fibrillation (HCC)   Hypertensive heart disease   Mitral regurgitation   Chronic anticoagulation   Diastolic CHF, chronic (HCC)   CAP (community acquired pneumonia)   Acute respiratory failure with hypoxia (HCC)   Anemia  1]acute respiratory failure with hypoxia secondary to fluid overload pleural effusion/possible atypicalpneumonia-patient receives Lasix 40 mg daily at home she has a history of congestive heart failURE.THE dose is decreased to 20 mg as patient bp is soft.mbs-Mild stricture at of eso at c6 level.no aspiration. 2]hyponatremia resolved.  3}htn dc norvasc and cozaar as bp is normal .4]hypothyroidism continue Synthroid    Discharge Instructions     Allergies  Allergen Reactions  . Dilaudid [Hydromorphone Hcl] Other (See Comments)    hallucinations  . Vicodin [Hydrocodone-Acetaminophen] Other (See Comments)    hallucinations    Consultations:   none  Procedures/Studies: Dg Chest 2 View  Result Date: 06/12/2017 CLINICAL DATA:  Pneumonia short of breath EXAM: CHEST  2 VIEW COMPARISON:  06/11/2017 FINDINGS: Bibasilar airspace disease mildly improved. Pulmonary vascular congestion mildly improved. Small bilateral effusions unchanged. Pacemaker unchanged. Cardiac enlargement. IMPRESSION: Mild improvement in vascular congestion. Mild improvement in bibasilar airspace disease. Small bilateral effusions unchanged. Electronically Signed   By: Franchot Gallo M.D.   On: 06/12/2017 08:19   Dg Chest 2 View  Result Date: 06/11/2017 CLINICAL DATA:  Shortness of breath, hypoxemia. EXAM: CHEST  2 VIEW COMPARISON:  Radiographs of August 04, 2015. FINDINGS: Stable cardiomegaly. No pneumothorax is noted. Single lead left-sided pacemaker is unchanged in position. Bilateral basilar opacities are noted, left greater than right, concerning for edema or pneumonia. Moderate left pleural  effusion is noted as well. Bony thorax is unremarkable. IMPRESSION: Bilateral basilar edema or pneumonia is noted,, left greater than right. Moderate left pleural effusion is noted. Electronically Signed   By: Marijo Conception, M.D.   On: 06/11/2017 10:17   Ct Head Wo Contrast  Result Date: 06/11/2017 CLINICAL DATA:  Status post fall.  Increased lethargy. EXAM: CT HEAD WITHOUT CONTRAST TECHNIQUE: Contiguous axial images were obtained from the base of the skull through the vertex without intravenous contrast. COMPARISON:  08/28/2012 FINDINGS: Brain: No evidence of acute infarction, hemorrhage, extra-axial collection, ventriculomegaly, or mass effect. Generalized cerebral atrophy. Periventricular white matter low attenuation likely secondary to microangiopathy. Vascular: Cerebrovascular atherosclerotic calcifications are noted. Skull: Negative for fracture or focal lesion. Sinuses/Orbits: Visualized portions of the orbits are unremarkable. Visualized portions of the paranasal sinuses and mastoid air cells are unremarkable. Other: None. IMPRESSION: 1. No acute intracranial pathology. Electronically Signed   By: Kathreen Devoid   On: 06/11/2017 11:18   Dg Esophagus  Result Date: 06/12/2017 CLINICAL DATA:  Globus sensation. EXAM: ESOPHOGRAM/BARIUM SWALLOW TECHNIQUE: Single contrast examination was performed using  thin barium. FLUOROSCOPY TIME:  Fluoroscopy Time:  1 minutes 48 seconds Radiation Exposure Index (if provided by the fluoroscopic device): Number of Acquired Spot Images: 0 COMPARISON:  None. FINDINGS: The examination is limited by small sips and difficulty swallowing. Also positioning was challenging. Negative for aspiration. There is a stricture in the proximal esophagus at approximately the C6 level. This is difficult to image but appears to be a smooth posterior stricture, likely prominent cricopharyngeus muscle. Remainder of the esophagus has normal mucosa without ulcer or mass or stricture. No hiatal  hernia. Mild decreased peristalsis IMPRESSION: Benign-appearing stricture in the proximal esophagus at the C6 level, favor prominent cricopharyngeus muscle. Otherwise no significant abnormality. Electronically Signed   By: Franchot Gallo M.D.   On: 06/12/2017 15:51   Dg Chest Port 1 View  Result Date: 06/14/2017 CLINICAL DATA:  Shortness of breath. EXAM: PORTABLE CHEST 1 VIEW COMPARISON:  06/12/2017 FINDINGS: Persistent bilateral effusions larger on the left than the right with left base atelectasis. Upper lobes remain will aerated. Less generalized pulmonary edema. IMPRESSION: Less edema, but persistent bilateral effusions and lower lobe volume loss left more than right. Electronically Signed   By: Nelson Chimes M.D.   On: 06/14/2017 13:48   Dg Hip Unilat With Pelvis 2-3 Views Left  Result Date: 06/11/2017 CLINICAL DATA:  Left hip pain after a mechanical fall while walking this morning. The patient fell onto the left hip. EXAM: DG HIP (WITH OR WITHOUT PELVIS) 2-3V LEFT COMPARISON:  AP pelvis and right hip series of August 01, 2012 FINDINGS: The bones are subjectively osteopenic. There is a prosthetic left hip joint. Radiographic positioning of the prosthetic components is good. The interface with the native bone appears normal. No acute native bone abnormality is observed. IMPRESSION: There is no acute abnormality of the prosthetic left hip joint nor of the native bone. Electronically Signed   By: David  Martinique M.D.   On: 06/11/2017 09:24    (Echo, Carotid, EGD, Colonoscopy, ERCP)    Subjective:   Discharge Exam: Vitals:   06/17/17 0443 06/17/17 1257  BP: (!) 132/56 (!) 124/52  Pulse: 61 60  Resp: 18 16  Temp: 98.1 F (36.7 C) 98 F (36.7 C)  SpO2: 95% 98%   Vitals:   06/16/17 2141 06/17/17 0229 06/17/17 0443 06/17/17 1257  BP: (!) 128/53  (!) 132/56 (!) 124/52  Pulse: Marland Kitchen)  59  61 60  Resp: 20  18 16   Temp: 98.2 F (36.8 C)  98.1 F (36.7 C) 98 F (36.7 C)  TempSrc: Oral  Oral  Oral  SpO2: 100%  95% 98%  Weight:  70 kg (154 lb 5.2 oz)    Height:        General: Pt is alert, awake, not in acute distress Cardiovascular: RRR, S1/S2 +, no rubs, no gallops Respiratory: CTA bilaterally, no wheezing, no rhonchi Abdominal: Soft, NT, ND, bowel sounds + Extremities: no edema, no cyanosis    The results of significant diagnostics from this hospitalization (including imaging, microbiology, ancillary and laboratory) are listed below for reference.     Microbiology: Recent Results (from the past 240 hour(s))  Urine culture     Status: None   Collection Time: 06/11/17 10:21 AM  Result Value Ref Range Status   Specimen Description URINE, CLEAN CATCH  Final   Special Requests Normal  Final   Culture NO GROWTH  Final   Report Status 06/12/2017 FINAL  Final     Labs: BNP (last 3 results) No results for input(s): BNP in the last 8760 hours. Basic Metabolic Panel: Recent Labs  Lab 06/12/17 0516 06/13/17 0545 06/14/17 0557 06/16/17 0153 06/17/17 0200  NA 129* 134* 131* 135 132*  K 3.8 3.2* 4.1 4.2 4.4  CL 97* 100* 99* 100* 99*  CO2 19* 25 23 26 25   GLUCOSE 132* 116* 107* 106* 115*  BUN 10 10 13 14 12   CREATININE 0.80 0.82 0.79 0.90 0.86  CALCIUM 8.7* 9.3 9.4 9.3 9.7   Liver Function Tests: Recent Labs  Lab 06/11/17 0835 06/12/17 0516  AST 26 30  ALT 15 16  ALKPHOS 59 51  BILITOT 1.1 1.2  PROT 6.7 6.2*  ALBUMIN 3.4* 3.2*   No results for input(s): LIPASE, AMYLASE in the last 168 hours. No results for input(s): AMMONIA in the last 168 hours. CBC: Recent Labs  Lab 06/11/17 0835 06/12/17 0516 06/15/17 0213 06/16/17 0153  WBC 7.4 8.7 6.0 6.6  NEUTROABS  --   --   --  4.3  HGB 10.9* 10.9* 9.7* 9.8*  HCT 33.4* 33.3* 30.4* 30.8*  MCV 91.3 91.5 91.6 92.5  PLT 261 254 231 262   Cardiac Enzymes: No results for input(s): CKTOTAL, CKMB, CKMBINDEX, TROPONINI in the last 168 hours. BNP: Invalid input(s): POCBNP CBG: No results for input(s):  GLUCAP in the last 168 hours. D-Dimer No results for input(s): DDIMER in the last 72 hours. Hgb A1c No results for input(s): HGBA1C in the last 72 hours. Lipid Profile No results for input(s): CHOL, HDL, LDLCALC, TRIG, CHOLHDL, LDLDIRECT in the last 72 hours. Thyroid function studies No results for input(s): TSH, T4TOTAL, T3FREE, THYROIDAB in the last 72 hours.  Invalid input(s): FREET3 Anemia work up No results for input(s): VITAMINB12, FOLATE, FERRITIN, TIBC, IRON, RETICCTPCT in the last 72 hours. Urinalysis    Component Value Date/Time   COLORURINE YELLOW 06/11/2017 1028   APPEARANCEUR CLEAR 06/11/2017 1028   LABSPEC 1.013 06/11/2017 1028   PHURINE 6.0 06/11/2017 1028   GLUCOSEU NEGATIVE 06/11/2017 1028   HGBUR NEGATIVE 06/11/2017 1028   BILIRUBINUR NEGATIVE 06/11/2017 1028   KETONESUR NEGATIVE 06/11/2017 1028   PROTEINUR NEGATIVE 06/11/2017 1028   UROBILINOGEN 0.2 04/19/2014 1430   NITRITE NEGATIVE 06/11/2017 1028   LEUKOCYTESUR NEGATIVE 06/11/2017 1028   Sepsis Labs Invalid input(s): PROCALCITONIN,  WBC,  LACTICIDVEN Microbiology Recent Results (from the past 240 hour(s))  Urine culture  Status: None   Collection Time: 06/11/17 10:21 AM  Result Value Ref Range Status   Specimen Description URINE, CLEAN CATCH  Final   Special Requests Normal  Final   Culture NO GROWTH  Final   Report Status 06/12/2017 FINAL  Final     Time coordinating discharge: Over 30 minutes  SIGNED:   Georgette Shell, MD  [facility] 06/17/2017, 1:43 PM Pager   If 7PM-7AM, please contact night-coverage [email or web address] Password [email or web address]

## 2017-06-17 NOTE — Care Management Note (Signed)
Case Management Note  Patient Details  Name: [name] MRN: [id number] Date of Birth: 01/24/1933  Subjective/Objective:            Presented with c/o L hip pain, s/p fall, hx of Alzheimer's dementia, living at home with around-the-clock care, atrial fibrillation on Eliquis, diastolic heart failure.      Bishop Dublin III (Son) Avanthika Dehnert (Daughter)     [phone] or [phone] (cell) [phone]        Action/Plan: Plan is to d/c to ALF vs SNF today.CSW managing disposition to facility.  Expected Discharge Date:    06/17/2017              Expected Discharge Plan:     In-House Referral:  Clinical Social Work  Discharge planning Services  CM Consult   Status of Service:  completed  If discussed at [employer], dates discussed:    Additional Comments:  Sharin Mons, RN 06/17/2017, 9:57 AM

## 2017-06-17 NOTE — NC FL2 (Signed)
[place] LEVEL OF CARE SCREENING TOOL     IDENTIFICATION  Patient Name: [name] Birthdate: April 27, 1933 Sex: female Admission Date (Current Location): 06/11/2017  [facility] and [place] Number:  Herbalist and Address:  [facility] [place]. [facility], [place] [address], [place], [place] [address]      Provider Number: [id number]  Attending Physician Name and Address:  Georgette Shell, MD  Relative Name and Phone Number:  Jenny Reichmann, son, [phone]    Current Level of Care: Hospital Recommended Level of Care: Memory Care Prior Approval Number:    Date Approved/Denied:   PASRR Number: [id number]  Discharge Plan: Other (Comment)(Memory Care)    Current Diagnoses: Patient Active Problem List   Diagnosis Date Noted  . CAP (community acquired pneumonia) 06/11/2017  . Acute respiratory failure with hypoxia ([place]) 06/11/2017  . Anemia 06/11/2017  . 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 Bradycardia 07/29/2015  . Atrial fibrillation with slow ventricular response ([place])   . Second degree AV block   . Diastolic CHF, chronic ([place])   . Chronic anticoagulation   . PCO (posterior capsular opacification), right 12/22/2013  . Pseudophakia 12/22/2013  . Atrial fibrillation ([place])   . Hypertensive heart disease   . Mitral regurgitation   . Dementia 08/01/2012  . Nuclear cataract 08/14/2011    Orientation RESPIRATION BLADDER Height & Weight     Self  Normal Continent Weight: 70 kg (154 lb 5.2 oz) Height:  5\' 7"  (170.2 cm)  BEHAVIORAL SYMPTOMS/MOOD NEUROLOGICAL BOWEL NUTRITION STATUS  Other (Comment)(Tries to get out of bed at night by herself)   Continent Diet(Dysphagia 2 (fine chop);Thin liquid)  AMBULATORY STATUS COMMUNICATION OF NEEDS Skin   Limited Assist Verbally Normal                       Personal Care Assistance Level of Assistance  Bathing, Feeding, Dressing Bathing Assistance: Limited assistance Feeding assistance:  Independent Dressing Assistance: Limited assistance     Functional Limitations Info             SPECIAL CARE FACTORS FREQUENCY  PT (By licensed PT), OT (By licensed OT)     PT Frequency: 3x/week OT Frequency: 2x/week            Contractures Contractures Info: Not present    Additional Factors Info  Code Status, Allergies, Psychotropic Code Status Info: DNR Allergies Info: Dilaudid Hydromorphone Hcl, Vicodin Hydrocodone-acetaminophen Psychotropic Info: Xanax, Wellbutrin, Trazadone         Current Medications (06/17/2017):    Discharge Medications: Medication List     STOP taking these medications   acetaminophen 500 MG tablet Commonly known as:  TYLENOL   amLODipine 5 MG tablet Commonly known as:  NORVASC   losartan 100 MG tablet Commonly known as:  COZAAR   metoprolol tartrate 25 MG tablet Commonly known as:  LOPRESSOR     TAKE these medications   ALPRAZolam 0.25 MG tablet Commonly known as:  XANAX Take 0.125 mg by mouth 2 (two) times daily as needed for anxiety.   apixaban 5 MG Tabs tablet Commonly known as:  ELIQUIS Take 1 tablet (5 mg total) by mouth 2 (two) times daily.   bisacodyl 5 MG EC tablet Commonly known as:  DULCOLAX Take 2 tablets (10 mg total) by mouth daily as needed for moderate constipation.   buPROPion 75 MG tablet Commonly known as:  WELLBUTRIN Take 1 tablet daily   CALCIUM + D  PO Take 1 tablet by mouth 2 (two) times daily.   calcium-vitamin D 500-200 MG-UNIT tablet Commonly known as:  OSCAL WITH D Take 1 tablet by mouth 2 (two) times daily.   furosemide 20 MG tablet Commonly known as:  LASIX Take 1 tablet (20 mg total) by mouth daily. Start taking on:  06/18/2017 What changed:    medication strength  how much to take   levothyroxine 75 MCG tablet Commonly known as:  SYNTHROID, LEVOTHROID Take 75 mcg by mouth daily before breakfast.   Melatonin 10 MG Tabs Take 1 tablet by mouth at bedtime.    multivitamin with minerals Tabs tablet Take 1 tablet by mouth daily.   NAMENDA XR 28 MG Cp24 24 hr capsule Generic drug:  memantine Take 28 mg by mouth at bedtime. Reported on 08/21/2015   polyethylene glycol packet Commonly known as:  MIRALAX / GLYCOLAX Take 17 g by mouth daily. Start taking on:  06/18/2017   potassium chloride 10 MEQ tablet Commonly known as:  K-DUR,KLOR-CON Take 1 tablet (10 mEq total) by mouth daily. Start taking on:  06/18/2017   ranitidine 75 MG tablet Commonly known as:  ZANTAC Take 75 mg by mouth daily.   rivastigmine 9.5 mg/24hr Commonly known as:  EXELON Place 1 patch onto the skin daily.   traZODone 50 MG tablet Commonly known as:  DESYREL Take 25 mg by mouth at bedtime.   Vitamin D 2000 units tablet Take 2,000 Units by mouth daily.      Relevant Imaging Results:  Relevant Lab Results:   Additional Information SSN: [place], [place]

## 2017-06-17 NOTE — Discharge Summary (Signed)
Physician Discharge Summary  [name] OBS:[id number] DOB: [birth year] DOA: 06/11/2017  PCP: Lajean Manes, MD  Admit date: 06/11/2017 Discharge date: 06/17/2017  Admitted From:HOME Disposition: ASSISTED LIVING Recommendations for Outpatient Follow-up:  1. Follow up with PCP in 1-2 weeks 2. Please obtain BMP/CBC in one week  [place] Equipment/Devices NONE Discharge Condition: STABLE CODE STATUS:DNR Diet recommendation:CARDIAC Brief/Interim Summary:83 y.o.femalewithhistory of Alzheimer's dementia, living at home with around-the-clock care, atrial fibrillation on Eliquis, diastolic heart failure, presenting to the ED with 2-3-day history of increased drowsiness, especially over the last day. History is obtained by her son who is at bedside,as the patient is a level 5 caveat due to lethargy and dementia. She had increased fatigue, and today, she had a unwitnessed fall, landing on her left side. Patient was not complaining of pain in the left hip, or have any bleeding issues. Family denies being told about any fevers, chills, night sweats, nausea, vomiting or diarrhea. She was urinating well, without expressing any pain in the lower abdomen, or having hematuria. The only new medication is Wellbutrin, started 3 days ago. The rest of the medications, she takes on a regular basis, and is compliant to them. Is not on any antibiotics, and she may have had a sick contact at the recreational center that she attends. Family did not notice any unilateral weakness, or the patient having any trouble swallowing, or vision changes. No syncope or presyncope. He did not have any other falls prior to this event.  12/8-family requesting her cardiologist to come by and see the patient. I have left a message with the answering service. They told me Dr. Curt Bears is her cardiologist and have left a message with the office. patientis somewhat anxious this morning otherwise was breathing  seems to be better   Discharge Diagnoses:  Active Problems:   Dementia   Atrial fibrillation (HCC)   Hypertensive heart disease   Mitral regurgitation   Chronic anticoagulation   Diastolic CHF, chronic (HCC)   CAP (community acquired pneumonia)   Acute respiratory failure with hypoxia (HCC)   Anemia 1]acute respiratory failure with hypoxia secondary to fluid overload pleural effusion/possible atypicalpneumonia-patient receives Lasix 40 mg daily at home she has a history of congestive heart failURE.THE dose is decreased to 20 mg as patient bp is soft.mbs-Mild stricture at of eso at c6 level.no aspiration. 2]hyponatremiaresolved. 3}htn dc norvasc and cozaar as bp is normal .4]hypothyroidism continue Synthroid        Discharge Instructions   Allergies as of 06/17/2017      Reactions   Dilaudid [hydromorphone Hcl] Other (See Comments)   hallucinations   Vicodin [hydrocodone-acetaminophen] Other (See Comments)   hallucinations      Medication List    STOP taking these medications   acetaminophen 500 MG tablet Commonly known as:  TYLENOL   amLODipine 5 MG tablet Commonly known as:  NORVASC   losartan 100 MG tablet Commonly known as:  COZAAR   metoprolol tartrate 25 MG tablet Commonly known as:  LOPRESSOR     TAKE these medications   ALPRAZolam 0.25 MG tablet Commonly known as:  XANAX Take 0.125 mg by mouth 2 (two) times daily as needed for anxiety.   apixaban 5 MG Tabs tablet Commonly known as:  ELIQUIS Take 1 tablet (5 mg total) by mouth 2 (two) times daily.   bisacodyl 5 MG EC tablet Commonly known as:  DULCOLAX Take 2 tablets (10 mg total) by mouth daily as needed for moderate constipation.  buPROPion 75 MG tablet Commonly known as:  WELLBUTRIN Take 1 tablet daily   CALCIUM + D PO Take 1 tablet by mouth 2 (two) times daily.   calcium-vitamin D 500-200 MG-UNIT tablet Commonly known as:  OSCAL WITH D Take 1 tablet by mouth 2 (two) times  daily.   furosemide 20 MG tablet Commonly known as:  LASIX Take 1 tablet (20 mg total) by mouth daily. Start taking on:  06/18/2017 What changed:    medication strength  how much to take   levothyroxine 75 MCG tablet Commonly known as:  SYNTHROID, LEVOTHROID Take 75 mcg by mouth daily before breakfast.   Melatonin 10 MG Tabs Take 1 tablet by mouth at bedtime.   multivitamin with minerals Tabs tablet Take 1 tablet by mouth daily.   NAMENDA XR 28 MG Cp24 24 hr capsule Generic drug:  memantine Take 28 mg by mouth at bedtime. Reported on 08/21/2015   polyethylene glycol packet Commonly known as:  MIRALAX / GLYCOLAX Take 17 g by mouth daily. Start taking on:  06/18/2017   potassium chloride 10 MEQ tablet Commonly known as:  K-DUR,KLOR-CON Take 1 tablet (10 mEq total) by mouth daily. Start taking on:  06/18/2017   ranitidine 75 MG tablet Commonly known as:  ZANTAC Take 75 mg by mouth daily.   rivastigmine 9.5 mg/24hr Commonly known as:  EXELON Place 1 patch onto the skin daily.   traZODone 50 MG tablet Commonly known as:  DESYREL Take 25 mg by mouth at bedtime.   Vitamin D 2000 units tablet Take 2,000 Units by mouth daily.      Follow-up Information    Lajean Manes, MD Follow up.   Specialty:  Internal Medicine Contact information: [address] [place] [address] [phone]        Constance Haw, MD Follow up.   Specialty:  Cardiology Contact information: [address] [place] [address] [phone]          Allergies  Allergen Reactions  . Dilaudid [Hydromorphone Hcl] Other (See Comments)    hallucinations  . Vicodin [Hydrocodone-Acetaminophen] Other (See Comments)    hallucinations    Consultations:NONE  Procedures/Studies: Dg Chest 2 View  Result Date: 06/12/2017 CLINICAL DATA:  Pneumonia short of breath EXAM: CHEST  2 VIEW COMPARISON:  06/11/2017 FINDINGS: Bibasilar airspace disease mildly  improved. Pulmonary vascular congestion mildly improved. Small bilateral effusions unchanged. Pacemaker unchanged. Cardiac enlargement. IMPRESSION: Mild improvement in vascular congestion. Mild improvement in bibasilar airspace disease. Small bilateral effusions unchanged. Electronically Signed   By: Franchot Gallo M.D.   On: 06/12/2017 08:19   Dg Chest 2 View  Result Date: 06/11/2017 CLINICAL DATA:  Shortness of breath, hypoxemia. EXAM: CHEST  2 VIEW COMPARISON:  Radiographs of August 04, 2015. FINDINGS: Stable cardiomegaly. No pneumothorax is noted. Single lead left-sided pacemaker is unchanged in position. Bilateral basilar opacities are noted, left greater than right, concerning for edema or pneumonia. Moderate left pleural effusion is noted as well. Bony thorax is unremarkable. IMPRESSION: Bilateral basilar edema or pneumonia is noted,, left greater than right. Moderate left pleural effusion is noted. Electronically Signed   By: Marijo Conception, M.D.   On: 06/11/2017 10:17   Ct Head Wo Contrast  Result Date: 06/11/2017 CLINICAL DATA:  Status post fall.  Increased lethargy. EXAM: CT HEAD WITHOUT CONTRAST TECHNIQUE: Contiguous axial images were obtained from the base of the skull through the vertex without intravenous contrast. COMPARISON:  08/28/2012 FINDINGS:  Brain: No evidence of acute infarction, hemorrhage, extra-axial collection, ventriculomegaly, or mass effect. Generalized cerebral atrophy. Periventricular white matter low attenuation likely secondary to microangiopathy. Vascular: Cerebrovascular atherosclerotic calcifications are noted. Skull: Negative for fracture or focal lesion. Sinuses/Orbits: Visualized portions of the orbits are unremarkable. Visualized portions of the paranasal sinuses and mastoid air cells are unremarkable. Other: None. IMPRESSION: 1. No acute intracranial pathology. Electronically Signed   By: Kathreen Devoid   On: 06/11/2017 11:18   Dg Esophagus  Result Date:  06/12/2017 CLINICAL DATA:  Globus sensation. EXAM: ESOPHOGRAM/BARIUM SWALLOW TECHNIQUE: Single contrast examination was performed using  thin barium. FLUOROSCOPY TIME:  Fluoroscopy Time:  1 minutes 48 seconds Radiation Exposure Index (if provided by the fluoroscopic device): Number of Acquired Spot Images: 0 COMPARISON:  None. FINDINGS: The examination is limited by small sips and difficulty swallowing. Also positioning was challenging. Negative for aspiration. There is a stricture in the proximal esophagus at approximately the C6 level. This is difficult to image but appears to be a smooth posterior stricture, likely prominent cricopharyngeus muscle. Remainder of the esophagus has normal mucosa without ulcer or mass or stricture. No hiatal hernia. Mild decreased peristalsis IMPRESSION: Benign-appearing stricture in the proximal esophagus at the C6 level, favor prominent cricopharyngeus muscle. Otherwise no significant abnormality. Electronically Signed   By: Franchot Gallo M.D.   On: 06/12/2017 15:51   Dg Chest Port 1 View  Result Date: 06/14/2017 CLINICAL DATA:  Shortness of breath. EXAM: PORTABLE CHEST 1 VIEW COMPARISON:  06/12/2017 FINDINGS: Persistent bilateral effusions larger on the left than the right with left base atelectasis. Upper lobes remain will aerated. Less generalized pulmonary edema. IMPRESSION: Less edema, but persistent bilateral effusions and lower lobe volume loss left more than right. Electronically Signed   By: Nelson Chimes M.D.   On: 06/14/2017 13:48   Dg Hip Unilat With Pelvis 2-3 Views Left  Result Date: 06/11/2017 CLINICAL DATA:  Left hip pain after a mechanical fall while walking this morning. The patient fell onto the left hip. EXAM: DG HIP (WITH OR WITHOUT PELVIS) 2-3V LEFT COMPARISON:  AP pelvis and right hip series of August 01, 2012 FINDINGS: The bones are subjectively osteopenic. There is a prosthetic left hip joint. Radiographic positioning of the prosthetic components  is good. The interface with the native bone appears normal. No acute native bone abnormality is observed. IMPRESSION: There is no acute abnormality of the prosthetic left hip joint nor of the native bone. Electronically Signed   By: David  Martinique M.D.   On: 06/11/2017 09:24    (Echo, Carotid, EGD, Colonoscopy, ERCP)    Subjective:   Discharge Exam: Vitals:   06/17/17 0443 06/17/17 1257  BP: (!) 132/56 (!) 124/52  Pulse: 61 60  Resp: 18 16  Temp: 98.1 F (36.7 C) 98 F (36.7 C)  SpO2: 95% 98%   Vitals:   06/16/17 2141 06/17/17 0229 06/17/17 0443 06/17/17 1257  BP: (!) 128/53  (!) 132/56 (!) 124/52  Pulse: (!) 59  61 60  Resp: 20  18 16   Temp: 98.2 F (36.8 C)  98.1 F (36.7 C) 98 F (36.7 C)  TempSrc: Oral  Oral Oral  SpO2: 100%  95% 98%  Weight:  70 kg (154 lb 5.2 oz)    Height:        General: Pt is alert, awake, not in acute distress Cardiovascular: RRR, S1/S2 +, no rubs, no gallops Respiratory: CTA bilaterally, no wheezing, no rhonchi Abdominal: Soft, NT, ND, bowel sounds +  Extremities: no edema, no cyanosis    The results of significant diagnostics from this hospitalization (including imaging, microbiology, ancillary and laboratory) are listed below for reference.     Microbiology: Recent Results (from the past 240 hour(s))  Urine culture     Status: None   Collection Time: 06/11/17 10:21 AM  Result Value Ref Range Status   Specimen Description URINE, CLEAN CATCH  Final   Special Requests Normal  Final   Culture NO GROWTH  Final   Report Status 06/12/2017 FINAL  Final     Labs: BNP (last 3 results) No results for input(s): BNP in the last 8760 hours. Basic Metabolic Panel: Recent Labs  Lab 06/12/17 0516 06/13/17 0545 06/14/17 0557 06/16/17 0153 06/17/17 0200  NA 129* 134* 131* 135 132*  K 3.8 3.2* 4.1 4.2 4.4  CL 97* 100* 99* 100* 99*  CO2 19* 25 23 26 25   GLUCOSE 132* 116* 107* 106* 115*  BUN 10 10 13 14 12   CREATININE 0.80 0.82 0.79 0.90  0.86  CALCIUM 8.7* 9.3 9.4 9.3 9.7   Liver Function Tests: Recent Labs  Lab 06/11/17 0835 06/12/17 0516  AST 26 30  ALT 15 16  ALKPHOS 59 51  BILITOT 1.1 1.2  PROT 6.7 6.2*  ALBUMIN 3.4* 3.2*   No results for input(s): LIPASE, AMYLASE in the last 168 hours. No results for input(s): AMMONIA in the last 168 hours. CBC: Recent Labs  Lab 06/11/17 0835 06/12/17 0516 06/15/17 0213 06/16/17 0153  WBC 7.4 8.7 6.0 6.6  NEUTROABS  --   --   --  4.3  HGB 10.9* 10.9* 9.7* 9.8*  HCT 33.4* 33.3* 30.4* 30.8*  MCV 91.3 91.5 91.6 92.5  PLT 261 254 231 262   Cardiac Enzymes: No results for input(s): CKTOTAL, CKMB, CKMBINDEX, TROPONINI in the last 168 hours. BNP: Invalid input(s): POCBNP CBG: No results for input(s): GLUCAP in the last 168 hours. D-Dimer No results for input(s): DDIMER in the last 72 hours. Hgb A1c No results for input(s): HGBA1C in the last 72 hours. Lipid Profile No results for input(s): CHOL, HDL, LDLCALC, TRIG, CHOLHDL, LDLDIRECT in the last 72 hours. Thyroid function studies No results for input(s): TSH, T4TOTAL, T3FREE, THYROIDAB in the last 72 hours.  Invalid input(s): FREET3 Anemia work up No results for input(s): VITAMINB12, FOLATE, FERRITIN, TIBC, IRON, RETICCTPCT in the last 72 hours. Urinalysis    Component Value Date/Time   COLORURINE YELLOW 06/11/2017 1028   APPEARANCEUR CLEAR 06/11/2017 1028   LABSPEC 1.013 06/11/2017 1028   PHURINE 6.0 06/11/2017 1028   GLUCOSEU NEGATIVE 06/11/2017 1028   HGBUR NEGATIVE 06/11/2017 1028   BILIRUBINUR NEGATIVE 06/11/2017 1028   KETONESUR NEGATIVE 06/11/2017 1028   PROTEINUR NEGATIVE 06/11/2017 1028   UROBILINOGEN 0.2 04/19/2014 1430   NITRITE NEGATIVE 06/11/2017 1028   LEUKOCYTESUR NEGATIVE 06/11/2017 1028   Sepsis Labs Invalid input(s): PROCALCITONIN,  WBC,  LACTICIDVEN Microbiology Recent Results (from the past 240 hour(s))  Urine culture     Status: None   Collection Time: 06/11/17 10:21 AM  Result  Value Ref Range Status   Specimen Description URINE, CLEAN CATCH  Final   Special Requests Normal  Final   Culture NO GROWTH  Final   Report Status 06/12/2017 FINAL  Final     Time coordinating discharge: Over 30 minutes  SIGNED:   Georgette Shell, MD  [facility] 06/17/2017, 2:13 PM Pager   If 7PM-7AM, please contact night-coverage [email or web address] Password [email or web address]

## 2017-06-17 NOTE — Progress Notes (Signed)
[name] to be D/C'd to [facility] per MD order.  Discussed with the patient's son and all questions fully answered.  VSS, Skin clean, dry and intact without evidence of skin break down, no evidence of skin tears noted. IV catheter discontinued intact. Site without signs and symptoms of complications. Dressing and pressure applied.  An After Visit Summary was printed and given to the patient's son. Patient's prescriptions sent to the pharmacy.   D/c education completed with patient/family including follow up instructions, medication list, d/c activities limitations if indicated, with other d/c instructions as indicated by MD - patient able to verbalize understanding, all questions fully answered.   Patient instructed to return to ED, call 911, or call MD for any changes in condition.   Patient escorted via [place], and D/C to facility via private auto.  Morley Kos Price 06/17/2017 4:34 PM

## 2017-06-17 NOTE — Consult Note (Signed)
           [facility] CM Primary Care Navigator  06/17/2017  [name] [birth year] [id number]   Per chart review, patient is living at home with round-the-clock care. She had an unwitnessed fall, landed on her left side and had increased drowsiness that resulted to this admission.   Patient was discharged today to skilled nursing facility (SNF- [facility] - son's request) per therapy recommendation.  Primary care provider's office is listed as providing transition of care (TOC).    For questions, please contact:  Dannielle Huh, BSN, RN- [facility] Primary Care Navigator  Telephone: [phone] [employer]

## 2017-06-17 NOTE — Care Management Important Message (Signed)
Important Message  Patient Details  Name: [name] MRN: [id number] Date of Birth: 1932/10/23   Medicare Important Message Given:  Yes    Dhruvan Gullion Montine Circle 06/17/2017, 3:50 PM

## 2017-06-17 NOTE — Progress Notes (Signed)
Patient will DC to: [facility] Anticipated DC date: 06/17/17 Family notified: Son Transport by: Ernst Spell   Per MD patient ready for DC to [facility]. RN, patient, patient's family, and facility notified of DC. Discharge Summary sent to facility. RN given number for report.    CSW signing off.  Cedric Fishman, [place] Social Worker [phone]

## 2017-06-19 DIAGNOSIS — D649 Anemia, unspecified: Secondary | ICD-10-CM | POA: Diagnosis not present

## 2017-06-19 DIAGNOSIS — F0280 Dementia in other diseases classified elsewhere without behavioral disturbance: Secondary | ICD-10-CM | POA: Diagnosis not present

## 2017-06-19 DIAGNOSIS — I119 Hypertensive heart disease without heart failure: Secondary | ICD-10-CM | POA: Diagnosis not present

## 2017-06-19 DIAGNOSIS — I5032 Chronic diastolic (congestive) heart failure: Secondary | ICD-10-CM | POA: Diagnosis not present

## 2017-06-19 DIAGNOSIS — I4891 Unspecified atrial fibrillation: Secondary | ICD-10-CM | POA: Diagnosis not present

## 2017-06-19 DIAGNOSIS — G309 Alzheimer's disease, unspecified: Secondary | ICD-10-CM | POA: Diagnosis not present

## 2017-06-20 DIAGNOSIS — I119 Hypertensive heart disease without heart failure: Secondary | ICD-10-CM | POA: Diagnosis not present

## 2017-06-20 DIAGNOSIS — D649 Anemia, unspecified: Secondary | ICD-10-CM | POA: Diagnosis not present

## 2017-06-20 DIAGNOSIS — I4891 Unspecified atrial fibrillation: Secondary | ICD-10-CM | POA: Diagnosis not present

## 2017-06-20 DIAGNOSIS — I5032 Chronic diastolic (congestive) heart failure: Secondary | ICD-10-CM | POA: Diagnosis not present

## 2017-06-20 DIAGNOSIS — F0280 Dementia in other diseases classified elsewhere without behavioral disturbance: Secondary | ICD-10-CM | POA: Diagnosis not present

## 2017-06-20 DIAGNOSIS — G309 Alzheimer's disease, unspecified: Secondary | ICD-10-CM | POA: Diagnosis not present

## 2017-06-23 DIAGNOSIS — I4891 Unspecified atrial fibrillation: Secondary | ICD-10-CM | POA: Diagnosis not present

## 2017-06-23 DIAGNOSIS — I5032 Chronic diastolic (congestive) heart failure: Secondary | ICD-10-CM | POA: Diagnosis not present

## 2017-06-23 DIAGNOSIS — D649 Anemia, unspecified: Secondary | ICD-10-CM | POA: Diagnosis not present

## 2017-06-23 DIAGNOSIS — G309 Alzheimer's disease, unspecified: Secondary | ICD-10-CM | POA: Diagnosis not present

## 2017-06-23 DIAGNOSIS — I119 Hypertensive heart disease without heart failure: Secondary | ICD-10-CM | POA: Diagnosis not present

## 2017-06-23 DIAGNOSIS — F0280 Dementia in other diseases classified elsewhere without behavioral disturbance: Secondary | ICD-10-CM | POA: Diagnosis not present

## 2017-06-24 DIAGNOSIS — I119 Hypertensive heart disease without heart failure: Secondary | ICD-10-CM | POA: Diagnosis not present

## 2017-06-24 DIAGNOSIS — I5032 Chronic diastolic (congestive) heart failure: Secondary | ICD-10-CM | POA: Diagnosis not present

## 2017-06-24 DIAGNOSIS — D649 Anemia, unspecified: Secondary | ICD-10-CM | POA: Diagnosis not present

## 2017-06-24 DIAGNOSIS — G309 Alzheimer's disease, unspecified: Secondary | ICD-10-CM | POA: Diagnosis not present

## 2017-06-24 DIAGNOSIS — I4891 Unspecified atrial fibrillation: Secondary | ICD-10-CM | POA: Diagnosis not present

## 2017-06-24 DIAGNOSIS — F0280 Dementia in other diseases classified elsewhere without behavioral disturbance: Secondary | ICD-10-CM | POA: Diagnosis not present

## 2017-06-25 DIAGNOSIS — I119 Hypertensive heart disease without heart failure: Secondary | ICD-10-CM | POA: Diagnosis not present

## 2017-06-25 DIAGNOSIS — D649 Anemia, unspecified: Secondary | ICD-10-CM | POA: Diagnosis not present

## 2017-06-25 DIAGNOSIS — G309 Alzheimer's disease, unspecified: Secondary | ICD-10-CM | POA: Diagnosis not present

## 2017-06-25 DIAGNOSIS — F0280 Dementia in other diseases classified elsewhere without behavioral disturbance: Secondary | ICD-10-CM | POA: Diagnosis not present

## 2017-06-25 DIAGNOSIS — I5032 Chronic diastolic (congestive) heart failure: Secondary | ICD-10-CM | POA: Diagnosis not present

## 2017-06-25 DIAGNOSIS — I4891 Unspecified atrial fibrillation: Secondary | ICD-10-CM | POA: Diagnosis not present

## 2017-06-26 DIAGNOSIS — G309 Alzheimer's disease, unspecified: Secondary | ICD-10-CM | POA: Diagnosis not present

## 2017-06-26 DIAGNOSIS — F0280 Dementia in other diseases classified elsewhere without behavioral disturbance: Secondary | ICD-10-CM | POA: Diagnosis not present

## 2017-06-26 DIAGNOSIS — I5032 Chronic diastolic (congestive) heart failure: Secondary | ICD-10-CM | POA: Diagnosis not present

## 2017-06-26 DIAGNOSIS — I4891 Unspecified atrial fibrillation: Secondary | ICD-10-CM | POA: Diagnosis not present

## 2017-06-26 DIAGNOSIS — I119 Hypertensive heart disease without heart failure: Secondary | ICD-10-CM | POA: Diagnosis not present

## 2017-06-26 DIAGNOSIS — D649 Anemia, unspecified: Secondary | ICD-10-CM | POA: Diagnosis not present

## 2017-06-27 DIAGNOSIS — F0280 Dementia in other diseases classified elsewhere without behavioral disturbance: Secondary | ICD-10-CM | POA: Diagnosis not present

## 2017-06-27 DIAGNOSIS — I5032 Chronic diastolic (congestive) heart failure: Secondary | ICD-10-CM | POA: Diagnosis not present

## 2017-06-27 DIAGNOSIS — G309 Alzheimer's disease, unspecified: Secondary | ICD-10-CM | POA: Diagnosis not present

## 2017-06-27 DIAGNOSIS — D649 Anemia, unspecified: Secondary | ICD-10-CM | POA: Diagnosis not present

## 2017-06-27 DIAGNOSIS — I4891 Unspecified atrial fibrillation: Secondary | ICD-10-CM | POA: Diagnosis not present

## 2017-06-27 DIAGNOSIS — I119 Hypertensive heart disease without heart failure: Secondary | ICD-10-CM | POA: Diagnosis not present

## 2017-06-30 DIAGNOSIS — I119 Hypertensive heart disease without heart failure: Secondary | ICD-10-CM | POA: Diagnosis not present

## 2017-06-30 DIAGNOSIS — I5032 Chronic diastolic (congestive) heart failure: Secondary | ICD-10-CM | POA: Diagnosis not present

## 2017-06-30 DIAGNOSIS — I4891 Unspecified atrial fibrillation: Secondary | ICD-10-CM | POA: Diagnosis not present

## 2017-06-30 DIAGNOSIS — D649 Anemia, unspecified: Secondary | ICD-10-CM | POA: Diagnosis not present

## 2017-06-30 DIAGNOSIS — G309 Alzheimer's disease, unspecified: Secondary | ICD-10-CM | POA: Diagnosis not present

## 2017-06-30 DIAGNOSIS — F0280 Dementia in other diseases classified elsewhere without behavioral disturbance: Secondary | ICD-10-CM | POA: Diagnosis not present

## 2017-07-02 DIAGNOSIS — G309 Alzheimer's disease, unspecified: Secondary | ICD-10-CM | POA: Diagnosis not present

## 2017-07-02 DIAGNOSIS — I5032 Chronic diastolic (congestive) heart failure: Secondary | ICD-10-CM | POA: Diagnosis not present

## 2017-07-02 DIAGNOSIS — I119 Hypertensive heart disease without heart failure: Secondary | ICD-10-CM | POA: Diagnosis not present

## 2017-07-02 DIAGNOSIS — I10 Essential (primary) hypertension: Secondary | ICD-10-CM | POA: Insufficient documentation

## 2017-07-02 DIAGNOSIS — R011 Cardiac murmur, unspecified: Secondary | ICD-10-CM | POA: Insufficient documentation

## 2017-07-02 DIAGNOSIS — F0280 Dementia in other diseases classified elsewhere without behavioral disturbance: Secondary | ICD-10-CM | POA: Diagnosis not present

## 2017-07-02 DIAGNOSIS — D649 Anemia, unspecified: Secondary | ICD-10-CM | POA: Diagnosis not present

## 2017-07-02 DIAGNOSIS — I4891 Unspecified atrial fibrillation: Secondary | ICD-10-CM | POA: Diagnosis not present

## 2017-07-02 DIAGNOSIS — M1990 Unspecified osteoarthritis, unspecified site: Secondary | ICD-10-CM | POA: Insufficient documentation

## 2017-07-02 DIAGNOSIS — Z950 Presence of cardiac pacemaker: Secondary | ICD-10-CM | POA: Insufficient documentation

## 2017-07-03 DIAGNOSIS — I119 Hypertensive heart disease without heart failure: Secondary | ICD-10-CM | POA: Diagnosis not present

## 2017-07-03 DIAGNOSIS — F0280 Dementia in other diseases classified elsewhere without behavioral disturbance: Secondary | ICD-10-CM | POA: Diagnosis not present

## 2017-07-03 DIAGNOSIS — I4891 Unspecified atrial fibrillation: Secondary | ICD-10-CM | POA: Diagnosis not present

## 2017-07-03 DIAGNOSIS — D649 Anemia, unspecified: Secondary | ICD-10-CM | POA: Diagnosis not present

## 2017-07-03 DIAGNOSIS — I5032 Chronic diastolic (congestive) heart failure: Secondary | ICD-10-CM | POA: Diagnosis not present

## 2017-07-03 DIAGNOSIS — G309 Alzheimer's disease, unspecified: Secondary | ICD-10-CM | POA: Diagnosis not present

## 2017-07-04 DIAGNOSIS — G309 Alzheimer's disease, unspecified: Secondary | ICD-10-CM | POA: Diagnosis not present

## 2017-07-04 DIAGNOSIS — F0280 Dementia in other diseases classified elsewhere without behavioral disturbance: Secondary | ICD-10-CM | POA: Diagnosis not present

## 2017-07-04 DIAGNOSIS — D649 Anemia, unspecified: Secondary | ICD-10-CM | POA: Diagnosis not present

## 2017-07-04 DIAGNOSIS — I5032 Chronic diastolic (congestive) heart failure: Secondary | ICD-10-CM | POA: Diagnosis not present

## 2017-07-04 DIAGNOSIS — I119 Hypertensive heart disease without heart failure: Secondary | ICD-10-CM | POA: Diagnosis not present

## 2017-07-04 DIAGNOSIS — I4891 Unspecified atrial fibrillation: Secondary | ICD-10-CM | POA: Diagnosis not present

## 2017-07-07 DIAGNOSIS — D649 Anemia, unspecified: Secondary | ICD-10-CM | POA: Diagnosis not present

## 2017-07-07 DIAGNOSIS — I5032 Chronic diastolic (congestive) heart failure: Secondary | ICD-10-CM | POA: Diagnosis not present

## 2017-07-07 DIAGNOSIS — I4891 Unspecified atrial fibrillation: Secondary | ICD-10-CM | POA: Diagnosis not present

## 2017-07-07 DIAGNOSIS — G309 Alzheimer's disease, unspecified: Secondary | ICD-10-CM | POA: Diagnosis not present

## 2017-07-07 DIAGNOSIS — I119 Hypertensive heart disease without heart failure: Secondary | ICD-10-CM | POA: Diagnosis not present

## 2017-07-07 DIAGNOSIS — F0280 Dementia in other diseases classified elsewhere without behavioral disturbance: Secondary | ICD-10-CM | POA: Diagnosis not present

## 2017-07-09 DIAGNOSIS — I482 Chronic atrial fibrillation: Secondary | ICD-10-CM | POA: Diagnosis not present

## 2017-07-09 DIAGNOSIS — R600 Localized edema: Secondary | ICD-10-CM | POA: Diagnosis not present

## 2017-07-09 DIAGNOSIS — I4891 Unspecified atrial fibrillation: Secondary | ICD-10-CM | POA: Diagnosis not present

## 2017-07-09 DIAGNOSIS — E039 Hypothyroidism, unspecified: Secondary | ICD-10-CM | POA: Diagnosis not present

## 2017-07-09 DIAGNOSIS — I119 Hypertensive heart disease without heart failure: Secondary | ICD-10-CM | POA: Diagnosis not present

## 2017-07-09 DIAGNOSIS — I5032 Chronic diastolic (congestive) heart failure: Secondary | ICD-10-CM | POA: Diagnosis not present

## 2017-07-09 DIAGNOSIS — I110 Hypertensive heart disease with heart failure: Secondary | ICD-10-CM | POA: Diagnosis not present

## 2017-07-09 DIAGNOSIS — F0280 Dementia in other diseases classified elsewhere without behavioral disturbance: Secondary | ICD-10-CM | POA: Diagnosis not present

## 2017-07-09 DIAGNOSIS — G309 Alzheimer's disease, unspecified: Secondary | ICD-10-CM | POA: Diagnosis not present

## 2017-07-09 DIAGNOSIS — K5900 Constipation, unspecified: Secondary | ICD-10-CM | POA: Diagnosis not present

## 2017-07-09 DIAGNOSIS — K219 Gastro-esophageal reflux disease without esophagitis: Secondary | ICD-10-CM | POA: Diagnosis not present

## 2017-07-09 DIAGNOSIS — D649 Anemia, unspecified: Secondary | ICD-10-CM | POA: Diagnosis not present

## 2017-07-09 DIAGNOSIS — E559 Vitamin D deficiency, unspecified: Secondary | ICD-10-CM | POA: Diagnosis not present

## 2017-07-09 DIAGNOSIS — I349 Nonrheumatic mitral valve disorder, unspecified: Secondary | ICD-10-CM | POA: Diagnosis not present

## 2017-07-10 DIAGNOSIS — I119 Hypertensive heart disease without heart failure: Secondary | ICD-10-CM | POA: Diagnosis not present

## 2017-07-10 DIAGNOSIS — I4891 Unspecified atrial fibrillation: Secondary | ICD-10-CM | POA: Diagnosis not present

## 2017-07-10 DIAGNOSIS — D649 Anemia, unspecified: Secondary | ICD-10-CM | POA: Diagnosis not present

## 2017-07-10 DIAGNOSIS — I5032 Chronic diastolic (congestive) heart failure: Secondary | ICD-10-CM | POA: Diagnosis not present

## 2017-07-10 DIAGNOSIS — F0280 Dementia in other diseases classified elsewhere without behavioral disturbance: Secondary | ICD-10-CM | POA: Diagnosis not present

## 2017-07-10 DIAGNOSIS — G309 Alzheimer's disease, unspecified: Secondary | ICD-10-CM | POA: Diagnosis not present

## 2017-07-14 ENCOUNTER — Ambulatory Visit (INDEPENDENT_AMBULATORY_CARE_PROVIDER_SITE_OTHER): Payer: Medicare Other | Admitting: *Deleted

## 2017-07-14 ENCOUNTER — Telehealth: Payer: Self-pay | Admitting: Cardiology

## 2017-07-14 DIAGNOSIS — I441 Atrioventricular block, second degree: Secondary | ICD-10-CM | POA: Diagnosis not present

## 2017-07-14 DIAGNOSIS — I119 Hypertensive heart disease without heart failure: Secondary | ICD-10-CM | POA: Diagnosis not present

## 2017-07-14 DIAGNOSIS — G309 Alzheimer's disease, unspecified: Secondary | ICD-10-CM | POA: Diagnosis not present

## 2017-07-14 DIAGNOSIS — I4891 Unspecified atrial fibrillation: Secondary | ICD-10-CM | POA: Diagnosis not present

## 2017-07-14 DIAGNOSIS — D649 Anemia, unspecified: Secondary | ICD-10-CM | POA: Diagnosis not present

## 2017-07-14 DIAGNOSIS — F0280 Dementia in other diseases classified elsewhere without behavioral disturbance: Secondary | ICD-10-CM | POA: Diagnosis not present

## 2017-07-14 DIAGNOSIS — I5032 Chronic diastolic (congestive) heart failure: Secondary | ICD-10-CM | POA: Diagnosis not present

## 2017-07-14 NOTE — Telephone Encounter (Signed)
Patient son Jenny Reichmann) calling, states that he just moved his mother to a facility and would like to know the next time patient needs to be seen.

## 2017-07-14 NOTE — Telephone Encounter (Signed)
Please use recall in Epic. Please call patient/son to schedule.

## 2017-07-14 NOTE — Progress Notes (Signed)
Remote pacemaker transmission.   

## 2017-07-14 NOTE — Telephone Encounter (Signed)
Spoke with patients son who states that his mother moved into a memory care unit and needed advice on her home monitor. I advised him to set up her home monitor within 6-8 feet from her bed. I will send instructions on how to send a manual transmission if necessary so that the facility can have this on file. Son verbalized understanding and requested the information be sent either by MyChart or mailed to address on file. Address confirmed.

## 2017-07-14 NOTE — Telephone Encounter (Signed)
Patient son calling,    1. Has your device fired? no  2. Is you device beeping? no  3. Are you experiencing draining or swelling at device site? no  4. Are you calling to see if we received your device transmission? no  5. Have you passed out? No  Patient is now a facility and her son Jenny Reichmann) would like to know how to send in transmissions.    Please route to [place]

## 2017-07-15 ENCOUNTER — Encounter: Payer: Self-pay | Admitting: Cardiology

## 2017-07-16 DIAGNOSIS — I5032 Chronic diastolic (congestive) heart failure: Secondary | ICD-10-CM | POA: Diagnosis not present

## 2017-07-16 DIAGNOSIS — D649 Anemia, unspecified: Secondary | ICD-10-CM | POA: Diagnosis not present

## 2017-07-16 DIAGNOSIS — G309 Alzheimer's disease, unspecified: Secondary | ICD-10-CM | POA: Diagnosis not present

## 2017-07-16 DIAGNOSIS — I119 Hypertensive heart disease without heart failure: Secondary | ICD-10-CM | POA: Diagnosis not present

## 2017-07-16 DIAGNOSIS — I4891 Unspecified atrial fibrillation: Secondary | ICD-10-CM | POA: Diagnosis not present

## 2017-07-16 DIAGNOSIS — F0280 Dementia in other diseases classified elsewhere without behavioral disturbance: Secondary | ICD-10-CM | POA: Diagnosis not present

## 2017-07-17 ENCOUNTER — Encounter: Payer: Medicare Other | Admitting: Cardiology

## 2017-07-21 DIAGNOSIS — F0151 Vascular dementia with behavioral disturbance: Secondary | ICD-10-CM | POA: Diagnosis not present

## 2017-07-21 DIAGNOSIS — F411 Generalized anxiety disorder: Secondary | ICD-10-CM | POA: Diagnosis not present

## 2017-07-22 DIAGNOSIS — F411 Generalized anxiety disorder: Secondary | ICD-10-CM | POA: Diagnosis not present

## 2017-07-22 DIAGNOSIS — F0151 Vascular dementia with behavioral disturbance: Secondary | ICD-10-CM | POA: Diagnosis not present

## 2017-08-04 DIAGNOSIS — F0151 Vascular dementia with behavioral disturbance: Secondary | ICD-10-CM | POA: Diagnosis not present

## 2017-08-04 DIAGNOSIS — F411 Generalized anxiety disorder: Secondary | ICD-10-CM | POA: Diagnosis not present

## 2017-08-05 DIAGNOSIS — F411 Generalized anxiety disorder: Secondary | ICD-10-CM | POA: Diagnosis not present

## 2017-08-05 DIAGNOSIS — J069 Acute upper respiratory infection, unspecified: Secondary | ICD-10-CM | POA: Diagnosis not present

## 2017-08-05 DIAGNOSIS — F0151 Vascular dementia with behavioral disturbance: Secondary | ICD-10-CM | POA: Diagnosis not present

## 2017-08-07 DIAGNOSIS — E039 Hypothyroidism, unspecified: Secondary | ICD-10-CM | POA: Diagnosis not present

## 2017-08-07 DIAGNOSIS — I482 Chronic atrial fibrillation: Secondary | ICD-10-CM | POA: Diagnosis not present

## 2017-08-08 DIAGNOSIS — 419620001 Death: Secondary | SNOMED CT | POA: Diagnosis not present

## 2017-08-08 DEATH — deceased

## 2017-10-13 ENCOUNTER — Encounter: Payer: BLUE CROSS/BLUE SHIELD | Admitting: *Deleted

## 2017-10-13 ENCOUNTER — Telehealth: Payer: Self-pay | Admitting: Cardiology

## 2017-10-13 NOTE — Telephone Encounter (Signed)
LMOVM reminding pt to send remote transmission.   

## 2017-11-10 ENCOUNTER — Ambulatory Visit: Payer: Medicare Other | Admitting: Neurology

## 2018-05-21 IMAGING — DX DG CHEST 2V
2 series · 2 of 2 positions shown · non-contrast
Comparison: Radiographs August 04, 2015.

CLINICAL DATA: Shortness of breath, hypoxemia.

EXAM:
CHEST  2 VIEW

[x chest ap]
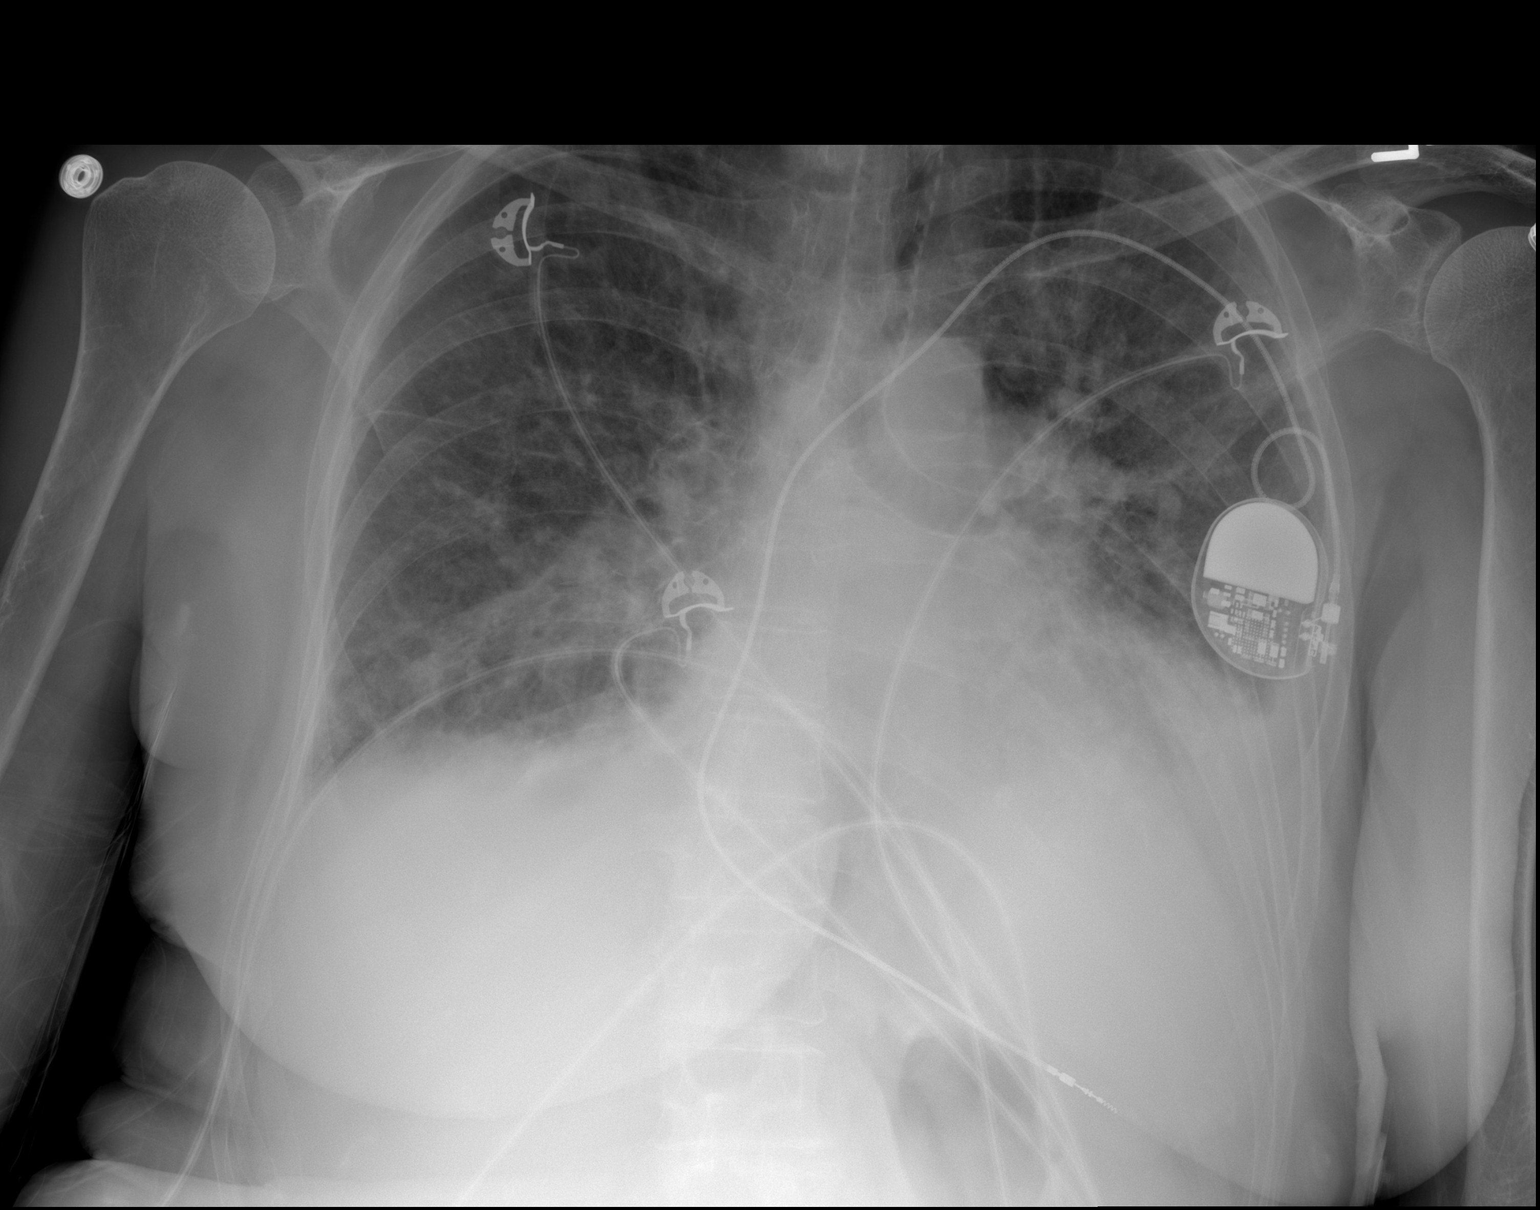

[w chest lat]
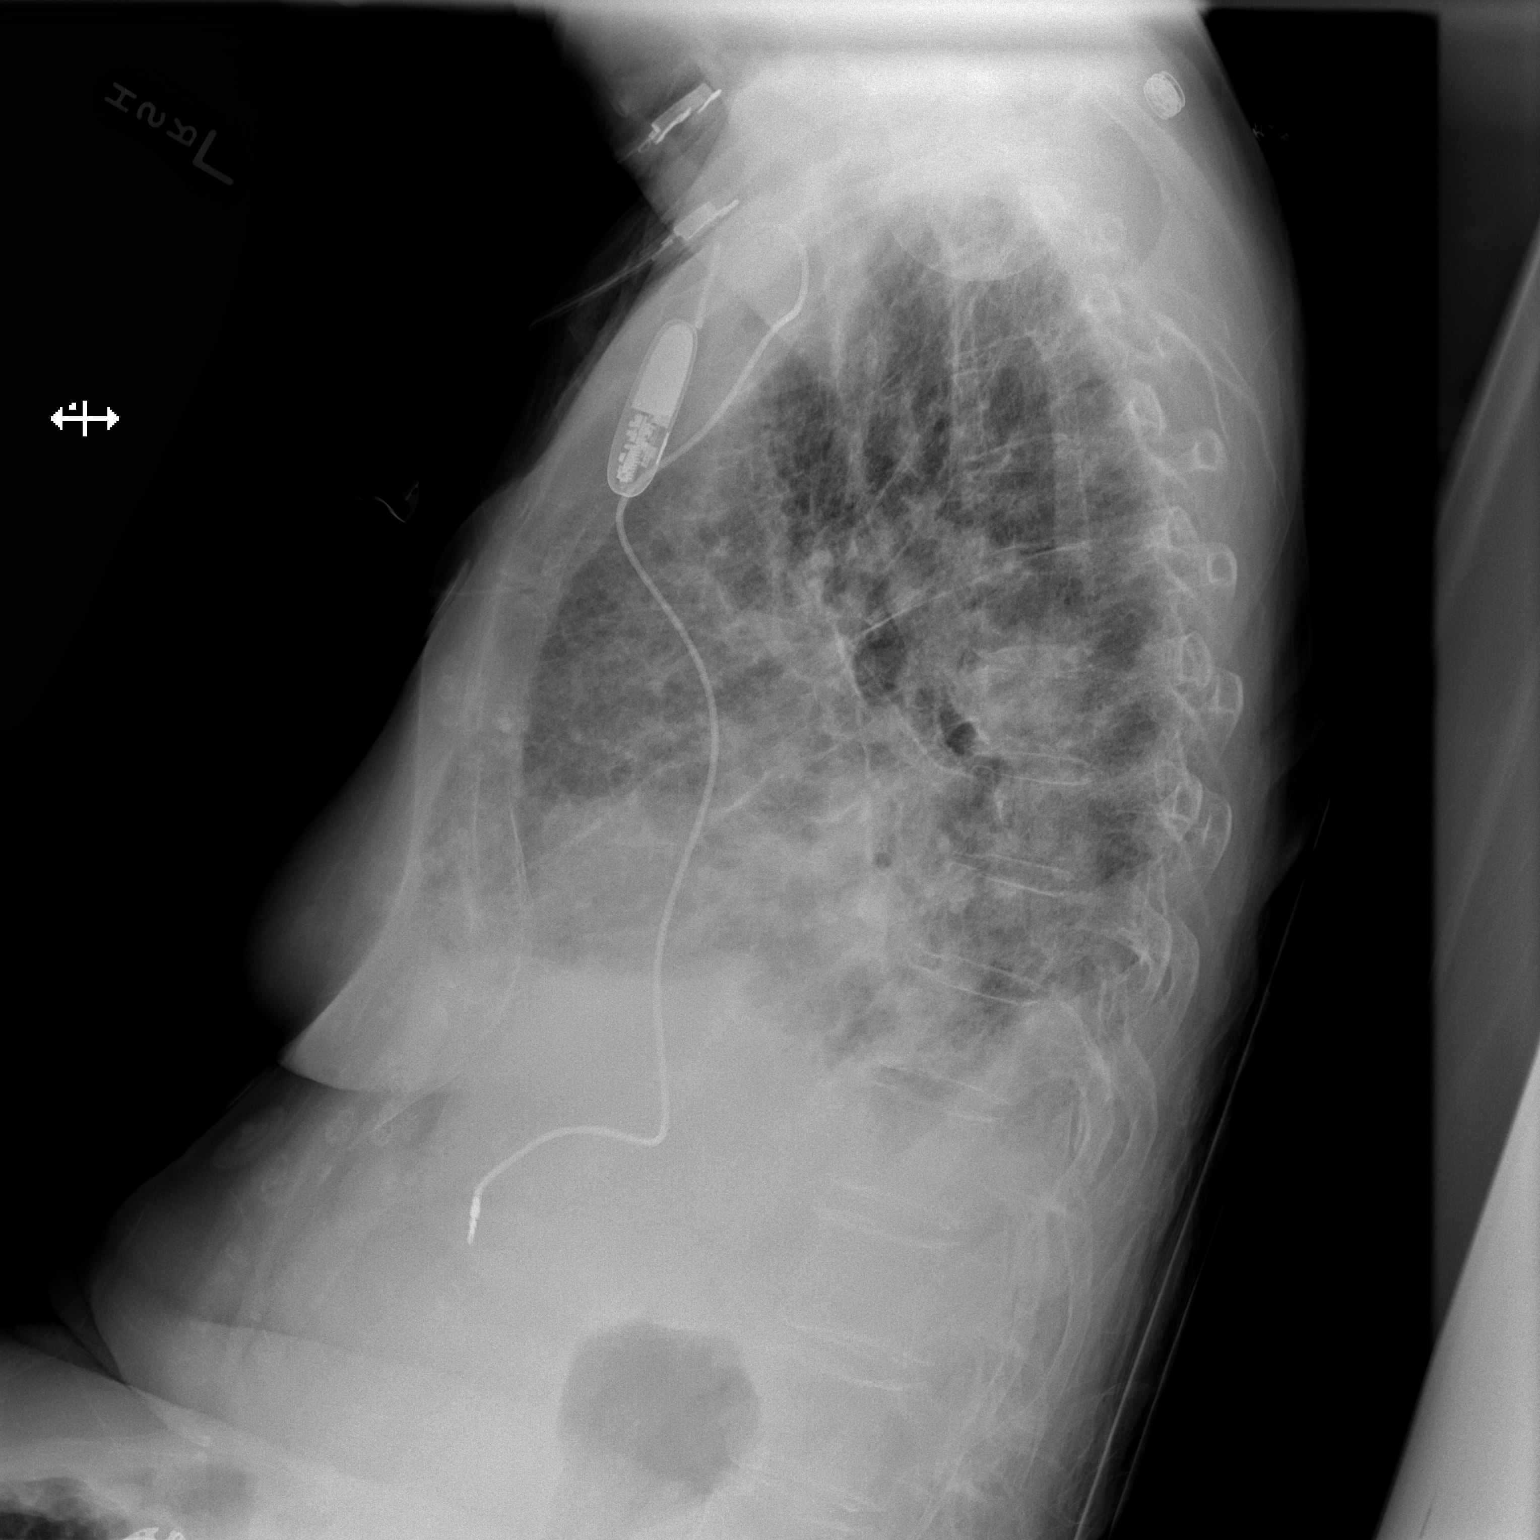

[2 of 2 positions shown; findings below may reference images not displayed]

FINDINGS: Stable cardiomegaly. No pneumothorax is noted. Single lead
left-sided pacemaker is unchanged in position. Bilateral basilar
opacities are noted, left greater than right, concerning for edema
or pneumonia. Moderate left pleural effusion is noted as well. Bony
thorax is unremarkable.
IMPRESSION: Bilateral basilar edema or pneumonia is noted,, left greater than
right. Moderate left pleural effusion is noted.
